# Patient Record
Sex: Female | Born: 1940 | Race: White | Hispanic: No | Smoking: Never smoker
Health system: Southern US, Community
[De-identification: ages and names within clinical notes are randomized; demographics above are authoritative.]

## PROBLEM LIST (undated history)

## (undated) DIAGNOSIS — M47812 Spondylosis without myelopathy or radiculopathy, cervical region: Secondary | ICD-10-CM

## (undated) DIAGNOSIS — M4802 Spinal stenosis, cervical region: Secondary | ICD-10-CM

## (undated) DIAGNOSIS — E785 Hyperlipidemia, unspecified: Secondary | ICD-10-CM

## (undated) DIAGNOSIS — Z5189 Encounter for other specified aftercare: Secondary | ICD-10-CM

## (undated) HISTORY — PX: COLONOSCOPY: SHX174

## (undated) HISTORY — PX: ABDOMINAL HYSTERECTOMY: SHX81

## (undated) HISTORY — PX: LAPAROSCOPIC SMALL BOWEL RESECTION: SUR793

## (undated) HISTORY — DX: Encounter for other specified aftercare: Z51.89

## (undated) HISTORY — PX: TOTAL KNEE ARTHROPLASTY: SHX125

## (undated) HISTORY — DX: Hyperlipidemia, unspecified: E78.5

## (undated) HISTORY — DX: Spondylosis without myelopathy or radiculopathy, cervical region: M47.812

## (undated) HISTORY — PX: ANTERIOR CERVICAL DECOMP/DISCECTOMY FUSION: SHX1161

## (undated) HISTORY — DX: Spinal stenosis, cervical region: M48.02

---

## 1998-10-14 ENCOUNTER — Other Ambulatory Visit: Admission: RE | Admit: 1998-10-14 | Discharge: 1998-10-14 | Payer: Self-pay | Admitting: Obstetrics & Gynecology

## 2001-02-22 ENCOUNTER — Other Ambulatory Visit: Admission: RE | Admit: 2001-02-22 | Discharge: 2001-02-22 | Payer: Self-pay | Admitting: Obstetrics & Gynecology

## 2003-03-13 ENCOUNTER — Other Ambulatory Visit: Admission: RE | Admit: 2003-03-13 | Discharge: 2003-03-13 | Payer: Self-pay | Admitting: Obstetrics & Gynecology

## 2004-09-08 ENCOUNTER — Ambulatory Visit: Payer: Self-pay | Admitting: Internal Medicine

## 2004-09-23 ENCOUNTER — Other Ambulatory Visit: Admission: RE | Admit: 2004-09-23 | Discharge: 2004-09-23 | Payer: Self-pay | Admitting: Obstetrics and Gynecology

## 2004-12-10 ENCOUNTER — Ambulatory Visit: Payer: Self-pay | Admitting: Internal Medicine

## 2005-01-01 ENCOUNTER — Ambulatory Visit: Payer: Self-pay | Admitting: Internal Medicine

## 2005-02-20 ENCOUNTER — Ambulatory Visit: Payer: Self-pay | Admitting: Internal Medicine

## 2005-02-23 ENCOUNTER — Ambulatory Visit: Payer: Self-pay | Admitting: Internal Medicine

## 2005-02-25 ENCOUNTER — Ambulatory Visit: Payer: Self-pay | Admitting: Internal Medicine

## 2007-01-25 ENCOUNTER — Ambulatory Visit: Payer: Self-pay | Admitting: Internal Medicine

## 2007-01-25 DIAGNOSIS — M353 Polymyalgia rheumatica: Secondary | ICD-10-CM | POA: Insufficient documentation

## 2007-01-25 DIAGNOSIS — R079 Chest pain, unspecified: Secondary | ICD-10-CM | POA: Insufficient documentation

## 2007-01-31 ENCOUNTER — Encounter (INDEPENDENT_AMBULATORY_CARE_PROVIDER_SITE_OTHER): Payer: Self-pay | Admitting: *Deleted

## 2007-02-23 ENCOUNTER — Ambulatory Visit: Payer: Self-pay | Admitting: Internal Medicine

## 2007-02-23 DIAGNOSIS — M542 Cervicalgia: Secondary | ICD-10-CM | POA: Insufficient documentation

## 2007-02-23 DIAGNOSIS — M5412 Radiculopathy, cervical region: Secondary | ICD-10-CM | POA: Insufficient documentation

## 2007-02-23 DIAGNOSIS — M25519 Pain in unspecified shoulder: Secondary | ICD-10-CM | POA: Insufficient documentation

## 2007-03-03 ENCOUNTER — Encounter (INDEPENDENT_AMBULATORY_CARE_PROVIDER_SITE_OTHER): Payer: Self-pay | Admitting: *Deleted

## 2007-03-05 ENCOUNTER — Encounter: Admission: RE | Admit: 2007-03-05 | Discharge: 2007-03-05 | Payer: Self-pay | Admitting: Internal Medicine

## 2007-03-07 ENCOUNTER — Encounter: Payer: Self-pay | Admitting: Internal Medicine

## 2007-03-08 ENCOUNTER — Encounter (INDEPENDENT_AMBULATORY_CARE_PROVIDER_SITE_OTHER): Payer: Self-pay | Admitting: *Deleted

## 2007-03-14 ENCOUNTER — Ambulatory Visit: Payer: Self-pay | Admitting: Internal Medicine

## 2007-03-14 DIAGNOSIS — M47812 Spondylosis without myelopathy or radiculopathy, cervical region: Secondary | ICD-10-CM | POA: Insufficient documentation

## 2007-03-14 DIAGNOSIS — G5600 Carpal tunnel syndrome, unspecified upper limb: Secondary | ICD-10-CM | POA: Insufficient documentation

## 2007-03-15 ENCOUNTER — Telehealth (INDEPENDENT_AMBULATORY_CARE_PROVIDER_SITE_OTHER): Payer: Self-pay | Admitting: *Deleted

## 2007-06-08 ENCOUNTER — Telehealth (INDEPENDENT_AMBULATORY_CARE_PROVIDER_SITE_OTHER): Payer: Self-pay | Admitting: *Deleted

## 2007-06-22 ENCOUNTER — Emergency Department (HOSPITAL_COMMUNITY): Admission: EM | Admit: 2007-06-22 | Discharge: 2007-06-22 | Payer: Self-pay | Admitting: Family Medicine

## 2007-06-22 ENCOUNTER — Telehealth (INDEPENDENT_AMBULATORY_CARE_PROVIDER_SITE_OTHER): Payer: Self-pay | Admitting: *Deleted

## 2007-07-19 ENCOUNTER — Encounter: Payer: Self-pay | Admitting: Internal Medicine

## 2007-08-30 ENCOUNTER — Encounter: Payer: Self-pay | Admitting: Internal Medicine

## 2008-02-08 ENCOUNTER — Ambulatory Visit: Payer: Self-pay | Admitting: Internal Medicine

## 2008-02-08 ENCOUNTER — Encounter (INDEPENDENT_AMBULATORY_CARE_PROVIDER_SITE_OTHER): Payer: Self-pay | Admitting: *Deleted

## 2008-02-08 DIAGNOSIS — M810 Age-related osteoporosis without current pathological fracture: Secondary | ICD-10-CM | POA: Insufficient documentation

## 2008-02-08 DIAGNOSIS — D239 Other benign neoplasm of skin, unspecified: Secondary | ICD-10-CM | POA: Insufficient documentation

## 2008-02-08 DIAGNOSIS — R9431 Abnormal electrocardiogram [ECG] [EKG]: Secondary | ICD-10-CM | POA: Insufficient documentation

## 2008-02-08 DIAGNOSIS — R5381 Other malaise: Secondary | ICD-10-CM | POA: Insufficient documentation

## 2008-02-08 DIAGNOSIS — F3289 Other specified depressive episodes: Secondary | ICD-10-CM | POA: Insufficient documentation

## 2008-02-08 DIAGNOSIS — F329 Major depressive disorder, single episode, unspecified: Secondary | ICD-10-CM | POA: Insufficient documentation

## 2008-02-08 DIAGNOSIS — M129 Arthropathy, unspecified: Secondary | ICD-10-CM | POA: Insufficient documentation

## 2008-02-08 DIAGNOSIS — R5383 Other fatigue: Secondary | ICD-10-CM | POA: Insufficient documentation

## 2008-02-08 DIAGNOSIS — E785 Hyperlipidemia, unspecified: Secondary | ICD-10-CM | POA: Insufficient documentation

## 2008-02-09 ENCOUNTER — Telehealth (INDEPENDENT_AMBULATORY_CARE_PROVIDER_SITE_OTHER): Payer: Self-pay | Admitting: *Deleted

## 2008-02-10 ENCOUNTER — Encounter (INDEPENDENT_AMBULATORY_CARE_PROVIDER_SITE_OTHER): Payer: Self-pay | Admitting: *Deleted

## 2008-02-20 ENCOUNTER — Encounter (INDEPENDENT_AMBULATORY_CARE_PROVIDER_SITE_OTHER): Payer: Self-pay | Admitting: *Deleted

## 2008-02-27 ENCOUNTER — Encounter: Payer: Self-pay | Admitting: Internal Medicine

## 2008-03-07 ENCOUNTER — Ambulatory Visit: Payer: Self-pay | Admitting: Cardiology

## 2008-03-07 ENCOUNTER — Encounter: Payer: Self-pay | Admitting: Internal Medicine

## 2008-10-10 ENCOUNTER — Ambulatory Visit: Payer: Self-pay | Admitting: Internal Medicine

## 2008-10-10 LAB — CONVERTED CEMR LAB: Vit D, 25-Hydroxy: 18 ng/mL — ABNORMAL LOW (ref 30–89)

## 2008-10-12 ENCOUNTER — Telehealth (INDEPENDENT_AMBULATORY_CARE_PROVIDER_SITE_OTHER): Payer: Self-pay | Admitting: *Deleted

## 2008-10-12 LAB — CONVERTED CEMR LAB
AST: 21 units/L (ref 0–37)
Direct LDL: 152.4 mg/dL
Total Bilirubin: 0.7 mg/dL (ref 0.3–1.2)
Total CHOL/HDL Ratio: 4
Triglycerides: 72 mg/dL (ref 0.0–149.0)

## 2008-10-18 ENCOUNTER — Ambulatory Visit: Payer: Self-pay | Admitting: Internal Medicine

## 2008-10-18 DIAGNOSIS — E559 Vitamin D deficiency, unspecified: Secondary | ICD-10-CM | POA: Insufficient documentation

## 2008-10-18 LAB — CONVERTED CEMR LAB
Cholesterol, target level: 200 mg/dL
HDL goal, serum: 50 mg/dL
LDL Goal: 100 mg/dL

## 2008-10-30 ENCOUNTER — Ambulatory Visit: Payer: Self-pay | Admitting: Internal Medicine

## 2008-10-30 DIAGNOSIS — R109 Unspecified abdominal pain: Secondary | ICD-10-CM | POA: Insufficient documentation

## 2008-10-30 DIAGNOSIS — E739 Lactose intolerance, unspecified: Secondary | ICD-10-CM | POA: Insufficient documentation

## 2008-10-30 LAB — CONVERTED CEMR LAB
Albumin: 3.7 g/dL (ref 3.5–5.2)
Alkaline Phosphatase: 41 units/L (ref 39–117)
Basophils Absolute: 0 10*3/uL (ref 0.0–0.1)
Bilirubin, Direct: 0.1 mg/dL (ref 0.0–0.3)
Eosinophils Absolute: 0.2 10*3/uL (ref 0.0–0.7)
Ketones, urine, test strip: NEGATIVE
Lipase: 26 units/L (ref 11.0–59.0)
Lymphocytes Relative: 44.4 % (ref 12.0–46.0)
MCHC: 34.1 g/dL (ref 30.0–36.0)
Neutrophils Relative %: 44.4 % (ref 43.0–77.0)
Nitrite: NEGATIVE
Platelets: 215 10*3/uL (ref 150.0–400.0)
RDW: 14.2 % (ref 11.5–14.6)
Sed Rate: 11 mm/hr (ref 0–22)
Specific Gravity, Urine: <1.005

## 2008-10-31 ENCOUNTER — Encounter (INDEPENDENT_AMBULATORY_CARE_PROVIDER_SITE_OTHER): Payer: Self-pay | Admitting: *Deleted

## 2008-11-06 ENCOUNTER — Encounter (INDEPENDENT_AMBULATORY_CARE_PROVIDER_SITE_OTHER): Payer: Self-pay | Admitting: *Deleted

## 2009-01-10 ENCOUNTER — Encounter: Admission: RE | Admit: 2009-01-10 | Discharge: 2009-01-10 | Payer: Self-pay | Admitting: Neurosurgery

## 2009-09-20 ENCOUNTER — Ambulatory Visit: Payer: Self-pay | Admitting: Internal Medicine

## 2009-09-20 DIAGNOSIS — IMO0001 Reserved for inherently not codable concepts without codable children: Secondary | ICD-10-CM | POA: Insufficient documentation

## 2009-09-20 LAB — CONVERTED CEMR LAB: Inflenza A Ag: NEGATIVE

## 2010-01-22 ENCOUNTER — Ambulatory Visit: Payer: Self-pay | Admitting: Internal Medicine

## 2010-01-23 ENCOUNTER — Encounter: Payer: Self-pay | Admitting: Internal Medicine

## 2010-01-27 ENCOUNTER — Telehealth (INDEPENDENT_AMBULATORY_CARE_PROVIDER_SITE_OTHER): Payer: Self-pay | Admitting: *Deleted

## 2010-01-28 ENCOUNTER — Encounter: Payer: Self-pay | Admitting: Cardiovascular Disease

## 2010-01-28 ENCOUNTER — Encounter (HOSPITAL_COMMUNITY): Admission: RE | Admit: 2010-01-28 | Discharge: 2010-03-31 | Payer: Self-pay | Admitting: Internal Medicine

## 2010-01-28 ENCOUNTER — Encounter (INDEPENDENT_AMBULATORY_CARE_PROVIDER_SITE_OTHER): Payer: Self-pay | Admitting: *Deleted

## 2010-01-28 ENCOUNTER — Ambulatory Visit: Payer: Self-pay | Admitting: Cardiovascular Disease

## 2010-01-28 ENCOUNTER — Ambulatory Visit: Payer: Self-pay

## 2010-03-06 ENCOUNTER — Ambulatory Visit: Payer: Self-pay | Admitting: Internal Medicine

## 2010-03-14 ENCOUNTER — Ambulatory Visit: Payer: Self-pay | Admitting: Internal Medicine

## 2010-03-14 DIAGNOSIS — M545 Low back pain, unspecified: Secondary | ICD-10-CM | POA: Insufficient documentation

## 2010-03-14 DIAGNOSIS — M25569 Pain in unspecified knee: Secondary | ICD-10-CM | POA: Insufficient documentation

## 2010-03-14 DIAGNOSIS — M25469 Effusion, unspecified knee: Secondary | ICD-10-CM | POA: Insufficient documentation

## 2010-05-22 ENCOUNTER — Encounter: Payer: Self-pay | Admitting: Internal Medicine

## 2010-07-17 ENCOUNTER — Encounter: Payer: Self-pay | Admitting: Internal Medicine

## 2010-08-03 LAB — CONVERTED CEMR LAB
ALT: 22 units/L (ref 0–35)
AST: 20 units/L (ref 0–37)
AST: 21 units/L (ref 0–37)
Albumin: 3.9 g/dL (ref 3.5–5.2)
Alkaline Phosphatase: 38 units/L — ABNORMAL LOW (ref 39–117)
BUN: 7 mg/dL (ref 6–23)
Basophils Absolute: 0 10*3/uL (ref 0.0–0.1)
Basophils Absolute: 0 10*3/uL (ref 0.0–0.1)
Basophils Relative: 0.6 % (ref 0.0–1.0)
Basophils Relative: 0.9 % (ref 0.0–3.0)
CO2: 29 meq/L (ref 19–32)
Calcium: 9.2 mg/dL (ref 8.4–10.5)
Chloride: 103 meq/L (ref 96–112)
Cholesterol: 247 mg/dL — ABNORMAL HIGH (ref 0–200)
Creatinine, Ser: 0.7 mg/dL (ref 0.4–1.2)
Creatinine, Ser: 0.7 mg/dL (ref 0.4–1.2)
Direct LDL: 169.6 mg/dL
Eosinophils Absolute: 0.2 10*3/uL (ref 0.0–0.7)
Eosinophils Absolute: 0.3 10*3/uL (ref 0.0–0.7)
Eosinophils Relative: 2.8 % (ref 0.0–5.0)
Eosinophils Relative: 5.1 % — ABNORMAL HIGH (ref 0.0–5.0)
GFR calc Af Amer: 108 mL/min
GFR calc non Af Amer: 87.2 mL/min (ref >60)
GFR calc non Af Amer: 89 mL/min
HCT: 37.2 % (ref 36.0–46.0)
HCT: 37.8 % (ref 36.0–46.0)
Hemoglobin: 13 g/dL (ref 12.0–15.0)
Lymphs Abs: 2.2 10*3/uL (ref 0.7–4.0)
MCHC: 34.6 g/dL (ref 30.0–36.0)
MCV: 91.4 fL (ref 78.0–100.0)
MCV: 92.7 fL (ref 78.0–100.0)
Monocytes Absolute: 0.3 10*3/uL (ref 0.1–1.0)
Monocytes Absolute: 0.3 10*3/uL (ref 0.1–1.0)
Monocytes Relative: 4.3 % (ref 3.0–11.0)
Neutro Abs: 3.7 10*3/uL (ref 1.4–7.7)
Neutrophils Relative %: 44.8 % (ref 43.0–77.0)
Neutrophils Relative %: 48.9 % (ref 43.0–77.0)
Platelets: 196 10*3/uL (ref 150.0–400.0)
RBC: 4.03 M/uL (ref 3.87–5.11)
RBC: 4.08 M/uL (ref 3.87–5.11)
RDW: 13.9 % (ref 11.5–14.6)
RDW: 15.6 % — ABNORMAL HIGH (ref 11.5–14.6)
Rhuematoid fact SerPl-aCnc: <20 intl units/mL (ref 0–20)
Rhuematoid fact SerPl-aCnc: <20 intl units/mL — ABNORMAL LOW (ref 0.0–20.0)
Rhuematoid fact SerPl-aCnc: <20 intl units/mL — ABNORMAL LOW (ref 0.0–20.0)
Sed Rate: 15 mm/hr (ref 0–25)
Sed Rate: 17 mm/hr (ref 0–22)
Sodium: 139 meq/L (ref 135–145)
TSH: 1.93 microintl units/mL (ref 0.35–5.50)
Total Bilirubin: 0.4 mg/dL (ref 0.3–1.2)
Total CK: 51 units/L (ref 7–177)
Total CK: 70 units/L (ref 7–177)
Triglycerides: 87 mg/dL (ref 0.0–149.0)
WBC: 5.1 10*3/uL (ref 4.5–10.5)
WBC: 6.1 10*3/uL (ref 4.5–10.5)

## 2010-08-05 NOTE — Assessment & Plan Note (Signed)
Summary: knee swollen/cbs   Vital Signs:  Patient profile:   70 year old female Weight:      145 pounds Temp:     98.1 degrees F oral Pulse rate:   72 / minute Resp:     17 per minute BP sitting:   122 / 70  (left arm) Cuff size:   large  Vitals Entered By: Shonna Chock CMA (March 14, 2010 3:23 PM) CC: 1.) Right knee swollen since Wed, no known injury   2.) Patient also with right buttock pain, Lower Extremity Joint pain, Back pain   CC:  1.) Right knee swollen since Wed, no known injury   2.) Patient also with right buttock pain, Lower Extremity Joint pain, and Back pain.  History of Present Illness: Lower Extremity Joint Pain      This is a 70 year old woman who presents with Lower Extremity Joint pain X 3 days.  The patient reports swelling, but denies redness, giving away, locking, popping, stiffness for >1 hr, decreased ROM, and weakness.  The pain is located in the right knee.  The pain began suddenly and with no injury.  The pain is described as sharp, intermittent, and activity related.  The patient denies the following symptoms: fever, rash, photosensitivity, eye symptoms, diarrhea, and dysuria.  EA:VWUJ  support; ES Tylenol. Back Pain      The patient also presents with Back pain since 09/08.  The pain is located in the right SI joint.  The pain began suddenly. No trigger or radiation. The pain is made worse by  walking.  Risk factors for serious underlying conditions include age >= 50 years.    Current Medications (verified): 1)  Budeprion Xl 300 Mg  Tb24 (Bupropion Hcl) .Marland Kitchen.. 1 By Mouth Qd 2)  Fish Oil 1000 Mg Caps (Omega-3 Fatty Acids) .Marland Kitchen.. 1 By Mouth Once Daily  Allergies: 1)  ! Pravachol 2)  Niacin 3)  Biaxin  Physical Exam  General:  in no acute distress; alert,appropriate and cooperative throughout examination Neurologic:  alert & oriented X3, strength normal in all extremities, and DTRs symmetrical and normal but tender to percussion with hammer R knee.      Knee Exam  Gait:    Slight limp noted-right.    Skin:    Intact, no scars, lesions, rashes, cafe au lait spots, or bruising.    Inspection:    swelling R knee, none on L  Palpation:    Crepitus R knee. Decreased ROM R knee   Detailed Back/Spine Exam  Skin:    Intact with no erythema; no scarring or rash.    Inspection:     normal alignment of leg, ankle, hindfoot, and foot.   Palpation:    tender  to percussion R SI  area  Vascular:    dorsalis pedis and posterior tibial pulses 2+ and symmetric, no evidence of ischemia.   Lumbosacral Exam:  Lying Straight Leg Raise:    Right:  negative   Impression & Recommendations:  Problem # 1:  KNEE PAIN, RIGHT, ACUTE (ICD-719.46)  R/O meniscal tear  Orders: Orthopedic Referral (Ortho)  Her updated medication list for this problem includes:    Celebrex 200 Mg Caps (Celecoxib) .Marland Kitchen... 1 two times a day  Problem # 2:  JOINT EFFUSION, RIGHT KNEE (ICD-719.06)  Orders: Orthopedic Referral (Ortho)  Problem # 3:  LOW BACK PAIN, ACUTE (ICD-724.2)  due to altered gait  Her updated medication list for this problem includes:  Celebrex 200 Mg Caps (Celecoxib) .Marland Kitchen... 1 two times a day  Complete Medication List: 1)  Budeprion Xl 300 Mg Tb24 (Bupropion hcl) .Marland Kitchen.. 1 by mouth qd 2)  Fish Oil 1000 Mg Caps (Omega-3 fatty acids) .Marland Kitchen.. 1 by mouth once daily 3)  Celebrex 200 Mg Caps (Celecoxib) .Marland Kitchen.. 1 two times a day  Patient Instructions: 1)  Warm comresses three times a day to R knee. 2)  Recommended remaining out of work until cleared by [employer]. Prescriptions: CELEBREX 200 MG CAPS (CELECOXIB) 1 two times a day  #12 x 0   Entered and Authorized by:   Marga Melnick MD   Signed by:   Marga Melnick MD on 03/28/2010   Method used:   Samples Given   RxID:   [id number]

## 2010-08-05 NOTE — Progress Notes (Signed)
Summary: NUCLEAR PRE PROCEDURE  Phone Note Outgoing Call Call back at [facility] Phone [phone]   Call placed by: Rea College, CMA,  January 27, 2010 2:07 PM Call placed to: Patient Summary of Call: Left message with information on Myoview Information Sheet (see scanned document for details).      Nuclear Med Background Indications for Stress Test: Evaluation for Ischemia   History: Myocardial Perfusion Study  History Comments: '04 [id number], EF=66%  Symptoms: Chest Pressure, Chest Pressure with Exertion, Dizziness, Fatigue, Light-Headedness, SOB  Symptoms Comments: Stress   Nuclear Pre-Procedure Cardiac Risk Factors: Lipids

## 2010-08-05 NOTE — Miscellaneous (Signed)
Summary: Orders Update  Clinical Lists Changes  Orders: Added new Referral order of Misc. Referral (Misc. Ref) - Signed 

## 2010-08-05 NOTE — Letter (Signed)
Summary: [facility]  [facility]   Imported By: Freddy Jaksch 09/29/2007 15:45:38  _____________________________________________________________________  External Attachment:    Type:   Image     Comment:   External Document

## 2010-08-05 NOTE — Letter (Signed)
Summary: Outpatient Coinsurance Notice  Outpatient Coinsurance Notice   Imported By: Marylou Mccoy 02/12/2010 17:56:07  _____________________________________________________________________  External Attachment:    Type:   Image     Comment:   External Document

## 2010-08-05 NOTE — Assessment & Plan Note (Signed)
Summary: pain all over body/kdc   Vital Signs:  Patient profile:   70 year old female Weight:      146 pounds Temp:     99.5 degrees F oral Pulse rate:   68 / minute Resp:     17 per minute BP sitting:   118 / 68  (left arm)  Vitals Entered By: Jeremy Johann CMA (September 20, 2009 3:16 PM) CC: fever,nausea,body aches, chills x1day Comments REVIEWED MED LIST, PATIENT AGREED DOSE AND INSTRUCTION CORRECT    CC:  fever, nausea, body aches, and chills x1day.  History of Present Illness: Chills & fever since last night with diffuse constant , sharp ( 5 out of 10)  myalgias from waist up with malaise & fatigue.Rx: Motrin helped. No localizing symptoms. no flu shot. Her son has had fevers with arthragias & elevated LFTs  Allergies: 1)  Niacin 2)  Biaxin  Review of Systems General:  Complains of chills, fatigue, fever, and malaise; denies sweats. Eyes:  Denies discharge, eye pain, red eye, and vision loss-both eyes. ENT:  Complains of earache; denies ear discharge, nasal congestion, sinus pressure, and sore throat; Frontal headache  w/o purulence or facial pain. Resp:  Denies chest pain with inspiration, cough, and sputum productive. GI:  Denies diarrhea; No clay colored stool. GU:  Denies discharge, dysuria, and hematuria; No coke colored urine . MS:  Complains of muscle aches and thoracic pain; denies joint pain, joint redness, joint swelling, and mid back pain. Derm:  Denies lesion(s) and rash.  Physical Exam  General:  in no acute distress; alert,appropriate and cooperative throughout examination Eyes:  No corneal or conjunctival inflammation noted.  Perrla. No icterus Ears:  External ear exam shows no significant lesions or deformities.  Otoscopic examination reveals clear canals, tympanic membranes are intact bilaterally without bulging, retraction, inflammation or discharge. Hearing is grossly normal bilaterally. Nose:  External nasal examination shows no deformity or  inflammation. Nasal mucosa are pink and moist without lesions or exudates. Mouth:  Oral mucosa and oropharynx without lesions or exudates.  Teeth in good repair. Neck:  No deformities, masses, or tenderness noted. Lungs:  Normal respiratory effort, chest expands symmetrically. Lungs are clear to auscultation, no crackles or wheezes. Heart:  Normal rate and regular rhythm. S1 and S2 normal without gallop, murmur, click, rub.S4 Abdomen:  Bowel sounds positive,abdomen soft and non-tender without masses, organomegaly or hernias noted. Extremities:  No clubbing, cyanosis, edema, or deformity . Neurologic:  alert & oriented X3.   Skin:  Intact without suspicious lesions or rashes Cervical Nodes:  No lymphadenopathy noted Axillary Nodes:  No palpable lymphadenopathy   Impression & Recommendations:  Problem # 1:  FEVER (ICD-780.60)  Problem # 2:  MUSCLE PAIN (ICD-729.1)  Her updated medication list for this problem includes:    Tylenol 325 Mg Tabs (Acetaminophen) .Marland Kitchen... 1 by mouth once daily  Complete Medication List: 1)  Budeprion Xl 300 Mg Tb24 (Bupropion hcl) .Marland Kitchen.. 1 by mouth qd 2)  Maximum D3 50000 Unit Caps (cholecalciferol)  .... 50,000 international units once weekly 3)  Tylenol 325 Mg Tabs (Acetaminophen) .Marland Kitchen.. 1 by mouth once daily 4)  Bone Strength  .... 1 by mouth once daily 5)  Fish Oil 1000 Mg Caps (Omega-3 fatty acids) .Marland Kitchen.. 1 by mouth once daily 6)  B-50 Complex Tabs (B complex-folic acid) .Marland Kitchen.. 1 by mouth once daily 7)  Cognitex  .Marland KitchenMarland Kitchen. 1 by mouth once daily 8)  Longevity(anti-aging Multiple) Women's Formula  .Marland Kitchen.. 1 by  mouth once daily 9)  Vitamin D3 50000 Unit Caps (cholecalciferol)  .Marland Kitchen.. 1 pill once weekly 10)  Ranitidine Hcl 150 Mg Tabs (Ranitidine hcl) .Marland Kitchen.. 1 two times a day pre meals  Patient Instructions: 1)  Drink as much fluid as you can tolerate for the next few days. 2)  Take 400-600mg  of Ibuprofen (Advil, Motrin) with food every 4-6 hours as needed for relief of pain  or comfort of fever. 3)  Recommended remaining out of work for 03/18- 20/2011. To UC if no better over 24 hrs;recommended labs: 4)  BMP ; 5)  Hepatic Panel ; 6)  CBC w/ Diff ; 7)  Urine-dip & C&S ( [facility])  Laboratory Results   Date/Time Reported: September 20, 2009 4:21 PM  Other Tests  Influenza A: negative Influenza B: negative

## 2010-08-05 NOTE — Consult Note (Signed)
Summary: [facility]  [facility]   Imported By: Lanelle Bal 06/06/2010 11:24:26  _____________________________________________________________________  External Attachment:    Type:   Image     Comment:   External Document

## 2010-08-05 NOTE — Assessment & Plan Note (Signed)
Summary: Cardiology Nuclear Testing  Nuclear Med Background Indications for Stress Test: Evaluation for Ischemia   History: Myocardial Perfusion Study  History Comments: '04 [id number], EF=66%  Symptoms: Chest Pressure, Chest Pressure with Exertion, Dizziness, DOE, Fatigue, Light-Headedness  Symptoms Comments: Stress. Last episode of CP:2 weeks ago.   Nuclear Pre-Procedure Cardiac Risk Factors: Lipids Caffeine/Decaff Intake: None NPO After: 9:00 PM Lungs: Clear. IV 0.9% NS with Angio Cath: 20g     IV Site: (L) AC IV Started by: Stanton Kidney EMT-P Chest Size (in) 36     Cup Size DD     Height (in): 60 Weight (lb): 145 BMI: 28.42  Nuclear Med Study 1 or 2 day study:  1 day     Stress Test Type:  Stress Reading MD:  Charlton Haws, MD     Referring MD:  Marga Melnick, MD Resting Radionuclide:  Technetium 79m Tetrofosmin     Resting Radionuclide Dose:  11.0 mCi  Stress Radionuclide:  Technetium 67m Tetrofosmin     Stress Radionuclide Dose:  33.0 mCi   Stress Protocol Exercise Time (min):  6:30 min     Max HR:  137 bpm     Predicted Max HR:  153 bpm  Max Systolic BP: 158 mm Hg     Percent Max HR:  89.54 %     METS: 7.7 Rate Pressure Product:  40981    Stress Test Technologist:  Rea College CMA-N     Nuclear Technologist:  Domenic Polite CNMT  Rest Procedure  Myocardial perfusion imaging was performed at rest 45 minutes following the intravenous administration of Myoview Technetium 24m Tetrofosmin.  Stress Procedure  The patient exercised for 6:30.  The patient stopped due to fatigue and denied any chest pain.  There were no diagnostic ST-T wave changes, only occasional PVC's.  Myoview was injected at peak exercise and myocardial perfusion imaging was performed after a brief delay.  QPS Raw Data Images:  Normal; no motion artifact; normal heart/lung ratio. Stress Images:  NI: Uniform and normal uptake of tracer in all myocardial segments. Rest Images:  Normal homogeneous  uptake in all areas of the myocardium. Subtraction (SDS):  Normal Transient Ischemic Dilatation:  .87  (Normal <1.22)  Lung/Heart Ratio:  .27  (Normal <0.45)  Quantitative Gated Spect Images QGS EDV:  54 ml QGS ESV:  13 ml QGS EF:  76 % QGS cine images:  normal  Findings Normal nuclear study      Overall Impression  Exercise Capacity: Fair exercise capacity. BP Response: Normal blood pressure response. Clinical Symptoms: Dyspnea ECG Impression: No significant ST segment change suggestive of ischemia. Overall Impression: Normal stress nuclear study.  Appended Document: Cardiology Nuclear Testing Excellent ; no suggestion of significant coronary artery disease. Deconditioning suggested. Gradually increase walking over 6-8 weeks to 30-45 minutes 3-4X/week. Hopp  Appended Document: Cardiology Nuclear Testing mailed.

## 2010-08-05 NOTE — Assessment & Plan Note (Signed)
Summary: BAD COLD AND COUGH X2//PH   Vital Signs:  Patient profile:   70 year old female Height:      60 inches Weight:      144 pounds O2 Sat:      98 % on Room air Temp:     99.3 degrees F oral Pulse rate:   85 / minute Resp:     16 per minute BP sitting:   140 / 86  (left arm)  Vitals Entered By: Jeremy Johann CMA (March 06, 2010 12:49 PM)  O2 Flow:  Room air CC: COUGH, SORE THROAT, RUNNY NOSE, URI symptoms   CC:  COUGH, SORE THROAT, RUNNY NOSE, and URI symptoms.  History of Present Illness: She D/Ced pravastatin due to joint pain . URI Symptoms      This is a 70 year old woman who presents with URI symptoms X 2 days.  The patient reports sore throat (improving) and dry cough, but denies nasal congestion, purulent nasal discharge, and earache.  The patient denies fever, dyspnea, and wheezing.  The patient also reports sneezing and severe fatigue.  The patient denies itchy watery eyes.  The patient denies the following risk factors for Strep sinusitis: unilateral facial pain, tooth pain, and tender adenopathy.  Rx: Tylenol Sinus, Alka Seltzer Plus, honey & tea                                                          Current Medications (verified): 1)  Budeprion Xl 300 Mg  Tb24 (Bupropion Hcl) .Marland Kitchen.. 1 By Mouth Qd 2)  Fish Oil 1000 Mg Caps (Omega-3 Fatty Acids) .Marland Kitchen.. 1 By Mouth Once Daily  Allergies (verified): 1)  ! Pravachol 2)  Niacin 3)  Biaxin  Physical Exam  General:  in no acute distress; alert,appropriate and cooperative throughout examination Ears:  External ear exam shows no significant lesions or deformities.  Otoscopic examination reveals clear canals, tympanic membranes are intact bilaterally without bulging, retraction, inflammation or discharge. Hearing is grossly normal bilaterally. Nose:  External nasal examination shows no deformity or inflammation. Nasal mucosa are pink and moist without lesions or exudates. Mouth:  Oral mucosa and oropharynx without  lesions or exudates.  Teeth in good repair. Minimal pharyngeal erythema.   Lungs:  Normal respiratory effort, chest expands symmetrically. Lungs are clear to auscultation, no crackles or wheezes. Heart:  Normal rate and regular rhythm. S1 and S2 normal without gallop, murmur, click, rub or other extra sounds. Cervical Nodes:  No lymphadenopathy noted Axillary Nodes:  No palpable lymphadenopathy   Impression & Recommendations:  Problem # 1:  URI (ICD-465.9)  Problem # 2:  COUGH (ICD-786.2)  Complete Medication List: 1)  Budeprion Xl 300 Mg Tb24 (Bupropion hcl) .Marland Kitchen.. 1 by mouth qd 2)  Fish Oil 1000 Mg Caps (Omega-3 fatty acids) .Marland Kitchen.. 1 by mouth once daily 3)  Azithromycin 250 Mg Tabs (Azithromycin) 4)  Doxycycline Hyclate 100 Mg Caps (Doxycycline hyclate) .Marland Kitchen.. 1 two times a day x 2 days then  1 once daily ; avoid sun  Patient Instructions: 1)  Mucinex DM as per package  instructions for cough. 2)  Drink as much fluid as you can tolerate for the next few days. Prescriptions: DOXYCYCLINE HYCLATE 100 MG CAPS (DOXYCYCLINE HYCLATE) 1 two times a day X 2 days  then  1 once daily ; avoid sun  #12 x 0   Entered and Authorized by:   Marga Melnick MD   Signed by:   Marga Melnick MD on 03/06/2010   Method used:   Faxed to ...       [facility]  [address] [phone]* (retail)       [address]       [place], [place]  [address]       Ph: [phone] or [phone]       Fax: [phone]   RxID:   [id number]

## 2010-08-05 NOTE — Assessment & Plan Note (Signed)
Summary: body hurt all over//fd   Vital Signs:  Patient profile:   70 year old female Weight:      137 pounds Temp:     97.9 degrees F oral Pulse rate:   84 / minute Resp:     17 per minute BP sitting:   130 / 78  (left arm) Cuff size:   regular  Vitals Entered By: Shonna Chock (October 18, 2008 11:01 AM) CC: Lipid Management Comments BODY ACHES X LONG TIME-PATIENT STATES " I TOLERATE A LOT OF PAIN UNTIL IT CONTINUES FOR A LONG TIIME" PATIENT SAID SHE THOUGHT SYMPTOMS RELATED TO CHLOSTEROL MED-OFF MED X FEW MONTHS. DISCUSS LABS    CC:  Lipid Management.  History of Present Illness: Labs reviewed : Vit D 18 on approx  1500  International Units D3 once daily. High dose vit D D/Ced. No PMH of fractures . LDL 153 off statin; "I failed to do that. I had more pain on the medication". No specific diet; no CVE  but active @ work.  NMR reviewed ; LDL goal = < 100. Risks & options discussed  Lipid Management History:      Positive NCEP/ATP III risk factors include female age 58 years old or older.  Negative NCEP/ATP III risk factors include non-diabetic, no family history for ischemic heart disease, non-tobacco-user status, non-hypertensive, no ASHD (atherosclerotic heart disease), no prior stroke/TIA, no peripheral vascular disease, and no history of aortic aneurysm.     Allergies: 1)  Niacin 2)  Biaxin  Review of Systems CV:  Denies chest pain or discomfort, leg cramps with exertion, and shortness of breath with exertion. MS:  Denies muscle aches and muscle weakness.  Physical Exam  General:  in no acute distress; alert,appropriate and cooperative throughout examination Neck:  No deformities, masses, or tenderness noted. Lungs:  Normal respiratory effort, chest expands symmetrically. Lungs are clear to auscultation, no crackles or wheezes. Heart:  Normal rate and regular rhythm. S1 and S2 normal without gallop, murmur, click, rub. S4 Pulses:  R and L carotid,radial,dorsalis pedis and  posterior tibial pulses are full and equal bilaterally Extremities:  No clubbing, cyanosis, edema Neurologic:  alert & oriented X3.   Skin:  Intact without suspicious lesions or rashes Psych:  memory intact for recent and remote, normally interactive, and good eye contact.   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 1 qhs  Complete Medication List: 1)  Budeprion Xl 300 Mg Tb24 (Bupropion hcl) .Marland Kitchen.. 1 by mouth qd 2)  Maximum D3 50000 Unit Caps (cholecalciferol)  .... 50,000 international units once weekly 3)  Tylenol 325 Mg Tabs (Acetaminophen) .Marland Kitchen.. 1 by mouth once daily 4)  Bone Strength  .... 1 by mouth once daily 5)  Fish Oil 1000 Mg Caps (Omega-3 fatty acids) .Marland Kitchen.. 1 by mouth once daily 6)  B-50 Complex Tabs (B complex-folic acid) .Marland Kitchen.. 1 by mouth once daily 7)  Cognitex  .Marland KitchenMarland Kitchen. 1 by mouth once daily 8)  Longevity(anti-aging Multiple) Women's Formula  .Marland Kitchen.. 1 by mouth once daily 9)  Vitamin D3 50000 Unit Caps (cholecalciferol)  .Marland Kitchen.. 1 pill once weekly  Lipid Assessment/Plan:      Based on NCEP/ATP III, the patient's risk factor category is "0-1 risk factors".  The patient's  lipid goals have been set as follows: Total cholesterol goal is 200; LDL cholesterol goal is 100; HDL cholesterol goal is 50; Triglyceride goal is 150.  Her LDL cholesterol goal has not been met.  Secondary causes for hyperlipidemia have been ruled out.  She has been counseled on adjunctive measures for lowering her cholesterol and has been provided with dietary instructions.    Patient Instructions: 1)  Your LDL goal = <100. See [facility] Pharmacy March 2010 program on effective Red Yeast Rice products. Recheck fasting lipids ,CPK, & hepatic panel  after 4  months of Red Yeast Rice  supplement (272.4,995.20). Calcium 600 mg two times a day & vit D 50,000 International Units ONCE WEEKLY. Prescriptions: VITAMIN D3 50000 UNIT CAPS (CHOLECALCIFEROL) 1 pill ONCE WEEKLY  #12 x 3   Entered and Authorized by:   Marga Melnick MD   Signed by:   Marga Melnick MD on 10/18/2008   Method used:   Print then Give to Patient   RxID:   [id number]

## 2010-08-05 NOTE — Assessment & Plan Note (Signed)
Summary: DISCUSS D3 MEDS, DISCUSS CHOLESTEROL///SPH   Vital Signs:  Patient profile:   70 year old female Weight:      145.2 pounds Temp:     99.0 degrees F oral Pulse rate:   80 / minute Resp:     16 per minute BP sitting:   120 / 70  (left arm) Cuff size:   large  Vitals Entered By: Shonna Chock CMA (January 22, 2010 9:11 AM) CC: 1.) Ongoing Chest pain, patient see cardiology and they recommended futher testing and patient never followed up with cardiology.  2.) Patient is fasting and would like all labs drawn (including Vit D testing), Lipid Management   CC:  1.) Ongoing Chest pain, patient see cardiology and they recommended futher testing and patient never followed up with cardiology.  2.) Patient is fasting and would like all labs drawn (including Vit D testing), and Lipid Management.  History of Present Illness:      This is a 70 year old female who presents with Chest pain intermittently for years.  The patient reports resting chest pain, exertional chest pain, shortness of breath, dizziness, and light headedness, but denies nausea, vomiting, diaphoresis, palpitations, syncope, and indigestion.  The pain is described as intermittent and pressure-like.  The pain is located in the left anterior chest.  The pain radiates to the left arm.  Episodes of chest pain last 20-30 minutes.  The pain is brought on or made worse by emotional stress.  The pain is relieved or improved with rest. Dr Ludwig Clarks 09/09 evaluation was reviewed  ; Myoview was  recommended but  she declined to complete this. Stress Cardiolite was negative in 05/2003.  Lipid Management History:      Positive NCEP/ATP III risk factors include female age 17 years old or older.  Negative NCEP/ATP III risk factors include non-diabetic, no family history for ischemic heart disease, non-tobacco-user status, non-hypertensive, no ASHD (atherosclerotic heart disease), no prior stroke/TIA, no peripheral vascular disease, and no history of  aortic aneurysm.     Current Medications (verified): 1)  Budeprion Xl 300 Mg  Tb24 (Bupropion Hcl) .Marland Kitchen.. 1 By Mouth Qd 2)  Fish Oil 1000 Mg Caps (Omega-3 Fatty Acids) .Marland Kitchen.. 1 By Mouth Once Daily  Allergies: 1)  Niacin 2)  Biaxin  Past History:  Past Medical History: Cervical  spondylosis ; CTS ;? PMH of  Polymyalgia Rheumatica ( she denies this diagnosis) Depression Hyperlipidemia: NMR Lipoprofile : LDL 169(2280/1166), HDL 52,TG 116. LDL goal = < 100. Framingham Study LDL goal = < 160 Osteoporosis Chest pain 05/2003, negative  StressCardiolite, Dr Jens Som; Myoview declined in 2009; Vitamin D deficiency  Past Surgical History: G 4 P 4, 4 C sections Hysterectomy & BSO  ? diagnosis, ? dysfunctional bleeding Small bowel ? resection ,? 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 ENT:  Denies difficulty swallowing and hoarseness. GI:  Denies abdominal pain, bloody stools, dark tarry stools, and indigestion. GU:  Denies hematuria. MS:  Complains of joint pain; denies joint redness and joint swelling; Pains in neck & shoulder. PMH of vitamin D deficiency. Psych:  Complains of depression, easily angered, easily tearful, and irritability; denies anxiety; ? minimal benefit with Budeprion.  Physical Exam  General:  well-nourished,in no acute distress; alert,appropriate and cooperative throughout examination Eyes:  No corneal or conjunctival inflammation noted. No icterus Mouth:  Oral mucosa and oropharynx without lesions or exudates.  No pharyngeal erythema.   Lungs:  Normal respiratory effort, chest expands symmetrically. Lungs are clear to auscultation, no crackles or wheezes. Heart:  Normal rate and regular rhythm. S1 and S2 normal without gallop, murmur, click, rub. S4 with slurring Abdomen:  Bowel sounds positive,abdomen soft and non-tender without masses, organomegaly or hernias  noted. Pulses:  R and L carotid,radial,dorsalis pedis and posterior tibial pulses are full and equal bilaterally Extremities:  No clubbing, cyanosis, edema, or deformity noted.Homan's negative Skin:  Intact without suspicious lesions or rashes. No jaundice Cervical Nodes:  No lymphadenopathy noted Axillary Nodes:  No palpable lymphadenopathy Psych:  memory intact for recent and remote but history vague . Negative , depressed affect. "I'm here only because you won't refill my vit D"   Impression & Recommendations:  Problem # 1:  CHEST PAIN (ICD-786.50)  Orders: EKG w/ Interpretation (93000): minor NS ST-T changes  Problem # 2:  FATIGUE (ICD-780.79)  Orders: TLB-CBC Platelet - w/Differential (85025-CBCD) TLB-TSH (Thyroid Stimulating Hormone) (84443-TSH) TLB-BMP (Basic Metabolic Panel-BMET) (80048-METABOL) Specimen Handling ([address])  Problem # 3:  HYPERLIPIDEMIA (ICD-272.4)  previously Red  Rice Yeast, off this 3 weeks  Orders: Venipuncture (60454) TLB-Lipid Panel (80061-LIPID) TLB-Hepatic/Liver Function Pnl (80076-HEPATIC)  Problem # 4:  SHOULDER PAIN, BILATERAL (ICD-719.41)   with neck pain also, R/O PMR The following medications were removed from the medication list:    Tylenol 325 Mg Tabs (Acetaminophen) .Marland Kitchen... 1 by mouth once daily  Orders: Venipuncture (09811) TLB-Sedimentation Rate (ESR) (85652-ESR)  Problem # 5:  VITAMIN D DEFICIENCY (ICD-268.9)  Orders: Venipuncture (91478) T-Vitamin D (25-Hydroxy) (29562-13086)  Complete Medication List: 1)  Budeprion Xl 300 Mg Tb24 (Bupropion hcl) .Marland Kitchen.. 1 by mouth qd 2)  Fish Oil 1000 Mg Caps (Omega-3 fatty acids) .Marland Kitchen.. 1 by mouth once daily  Lipid Assessment/Plan:      Based on NCEP/ATP III, the patient's risk factor category is "0-1 risk factors".  The patient's lipid goals are as follows: Total cholesterol goal is 200; LDL cholesterol goal is 100; HDL cholesterol goal is 50; Triglyceride goal is 150.  Her LDL cholesterol  goal has not been met.  Secondary causes for hyperlipidemia have been ruled out.  She has been counseled on adjunctive measures for lowering her cholesterol and has been provided with dietary instructions.    Patient Instructions: 1)  Complete stool cards. Further recommendations will be directed by lab results.

## 2010-08-07 NOTE — Consult Note (Signed)
Summary: [facility]   Imported By: Lanelle Bal 08/01/2010 10:55:43  _____________________________________________________________________  External Attachment:    Type:   Image     Comment:   External Document

## 2010-08-25 ENCOUNTER — Encounter (INDEPENDENT_AMBULATORY_CARE_PROVIDER_SITE_OTHER): Payer: Self-pay | Admitting: *Deleted

## 2010-09-02 NOTE — Letter (Signed)
Summary: Primary Care Appointment Letter  [facility] [place]  [address] [place], [place] [address]   Phone: [phone]  Fax: [phone]    08/25/2010 MRN: [id number]  Pamela Blair [address] [place], [place]  [address]  Dear [name],   Your Primary Care Physician Marga Melnick MD has indicated that:    _______it is time to schedule an appointment.    _______you missed your appointment on______ and need to call and          reschedule.    ___X____you need to have lab work done.    _______you need to schedule an appointment discuss lab or test results.    _______you need to call to reschedule your appointment that is                       scheduled on _________.     Please call our office as soon as possible. Our phone number is [phone]. Please press option 1. Our office is open 8a-5p, Monday through Friday.     Thank you,    [facility] Scheduler

## 2010-09-12 ENCOUNTER — Ambulatory Visit (INDEPENDENT_AMBULATORY_CARE_PROVIDER_SITE_OTHER): Payer: Medicare Other | Admitting: Internal Medicine

## 2010-09-12 ENCOUNTER — Emergency Department (HOSPITAL_COMMUNITY)
Admission: EM | Admit: 2010-09-12 | Discharge: 2010-09-12 | Disposition: A | Discharge status: Home / Self Care | Payer: Medicare Other | Attending: Emergency Medicine | Admitting: Emergency Medicine

## 2010-09-12 ENCOUNTER — Emergency Department (HOSPITAL_COMMUNITY): Payer: Medicare Other

## 2010-09-12 ENCOUNTER — Encounter: Payer: Self-pay | Admitting: Internal Medicine

## 2010-09-12 DIAGNOSIS — R7309 Other abnormal glucose: Secondary | ICD-10-CM | POA: Diagnosis present

## 2010-09-12 DIAGNOSIS — R109 Unspecified abdominal pain: Secondary | ICD-10-CM | POA: Insufficient documentation

## 2010-09-12 DIAGNOSIS — R9389 Abnormal findings on diagnostic imaging of other specified body structures: Secondary | ICD-10-CM | POA: Diagnosis present

## 2010-09-12 DIAGNOSIS — R0682 Tachypnea, not elsewhere classified: Secondary | ICD-10-CM | POA: Insufficient documentation

## 2010-09-12 DIAGNOSIS — R112 Nausea with vomiting, unspecified: Secondary | ICD-10-CM | POA: Insufficient documentation

## 2010-09-12 DIAGNOSIS — E876 Hypokalemia: Secondary | ICD-10-CM

## 2010-09-12 DIAGNOSIS — K259 Gastric ulcer, unspecified as acute or chronic, without hemorrhage or perforation: Secondary | ICD-10-CM | POA: Insufficient documentation

## 2010-09-12 LAB — COMPREHENSIVE METABOLIC PANEL WITH GFR
ALT: 23 U/L (ref 0–35)
AST: 22 U/L (ref 0–37)
Albumin: 4.4 g/dL (ref 3.5–5.2)
Alkaline Phosphatase: 53 U/L (ref 39–117)
Glucose, Bld: 116 mg/dL — ABNORMAL HIGH (ref 70–99)
Potassium: 3.4 meq/L — ABNORMAL LOW (ref 3.5–5.1)
Sodium: 137 meq/L (ref 135–145)
Total Protein: 7.7 g/dL (ref 6.0–8.3)

## 2010-09-12 LAB — CBC
HCT: 43.6 % (ref 36.0–46.0)
Hemoglobin: 14.1 g/dL (ref 12.0–15.0)
MCH: 29.9 pg (ref 26.0–34.0)
MCHC: 32.3 g/dL (ref 30.0–36.0)
MCV: 92.6 fL (ref 78.0–100.0)
Platelets: 252 K/uL (ref 150–400)
RBC: 4.71 MIL/uL (ref 3.87–5.11)
RDW: 14 % (ref 11.5–15.5)
WBC: 8.5 K/uL (ref 4.0–10.5)

## 2010-09-12 LAB — LIPASE, BLOOD: Lipase: 25 U/L (ref 11–59)

## 2010-09-12 LAB — COMPREHENSIVE METABOLIC PANEL
BUN: 8 mg/dL (ref 6–23)
CO2: 30 mEq/L (ref 19–32)
Calcium: 9.2 mg/dL (ref 8.4–10.5)
Chloride: 99 mEq/L (ref 96–112)
Creatinine, Ser: 0.62 mg/dL (ref 0.4–1.2)
GFR calc Af Amer: >60 mL/min (ref >60)
GFR calc non Af Amer: >60 mL/min (ref >60)
Total Bilirubin: 0.7 mg/dL (ref 0.3–1.2)

## 2010-09-12 LAB — URINALYSIS, ROUTINE W REFLEX MICROSCOPIC
Bilirubin Urine: NEGATIVE
Glucose, UA: NEGATIVE mg/dL
Hgb urine dipstick: NEGATIVE
Ketones, ur: NEGATIVE mg/dL
Nitrite: NEGATIVE
Protein, ur: NEGATIVE mg/dL
Specific Gravity, Urine: 1.016 (ref 1.005–1.030)
Urobilinogen, UA: 0.2 mg/dL (ref 0.0–1.0)
pH: 8.5 — ABNORMAL HIGH (ref 5.0–8.0)

## 2010-09-12 LAB — DIFFERENTIAL
Basophils Absolute: 0 10*3/uL (ref 0.0–0.1)
Basophils Relative: 0 % (ref 0–1)
Eosinophils Absolute: 0.1 10*3/uL (ref 0.0–0.7)
Eosinophils Relative: 1 % (ref 0–5)
Lymphocytes Relative: 27 % (ref 12–46)
Lymphs Abs: 2.3 K/uL (ref 0.7–4.0)
Monocytes Absolute: 0.4 10*3/uL (ref 0.1–1.0)
Monocytes Relative: 5 % (ref 3–12)
Neutro Abs: 5.7 K/uL (ref 1.7–7.7)
Neutrophils Relative %: 67 % (ref 43–77)

## 2010-09-12 MED ORDER — IOHEXOL 300 MG/ML  SOLN
100.0000 mL | Freq: Once | INTRAMUSCULAR | Status: AC | PRN
  Administered 2010-09-12: 100 mL via INTRAVENOUS

## 2010-09-15 ENCOUNTER — Encounter: Payer: Self-pay | Admitting: Gastroenterology

## 2010-09-15 LAB — H. PYLORI ANTIBODY, IGG: H Pylori IgG: 3.67 {ISR} — ABNORMAL HIGH

## 2010-09-22 ENCOUNTER — Telehealth: Payer: Self-pay

## 2010-09-23 NOTE — Letter (Signed)
Summary: New Patient letter  [facility]  [address] [place], [place] [address]   Phone: [phone]  Fax: [phone]       09/15/2010 MRN: [id number]  Pamela Blair [address] [place], [place]  [address]  [place]  Dear [name],  Welcome to the Gastroenterology Division at [facility].    You are scheduled to see Dr.  Arlyce Dice on 10-22-10 at 10:45am on the 3rd floor at [facility], [address]  We ask that you try to arrive at our office 15 minutes prior to your appointment time to allow for check-in.  We would like you to complete the enclosed self-administered evaluation form prior to your visit and bring it with you on the day of your appointment.  We will review it with you.  Also, please bring a complete list of all your medications or, if you prefer, bring the medication bottles and we will list them.  Please bring your insurance card so that we may make a copy of it.  If your insurance requires a referral to see a specialist, please bring your referral form from your primary care physician.  Co-payments are due at the time of your visit and may be paid by cash, check or credit card.     Your office visit will consist of a consult with your physician (includes a physical exam), any laboratory testing he/she may order, scheduling of any necessary diagnostic testing (e.g. x-ray, ultrasound, CT-scan), and scheduling of a procedure (e.g. Endoscopy, Colonoscopy) if required.  Please allow enough time on your schedule to allow for any/all of these possibilities.    If you cannot keep your appointment, please call [phone] to cancel or reschedule prior to your appointment date.  This allows [place] the opportunity to schedule an appointment for another patient in need of care.  If you do not cancel or reschedule by 5 p.m. the business day prior to your appointment date, you will be charged a $50.00 late cancellation/no-show fee.    Thank you for choosing  [facility] for your medical needs.  We appreciate the opportunity to care for you.  Please visit [place] at our website  to learn more about our practice.                     Sincerely,                                                             The Gastroenterology Division

## 2010-09-23 NOTE — Assessment & Plan Note (Signed)
Summary: VOMITING ALL NIGHT, PAIN IN STOMACH///SPH   Vital Signs:  Patient profile:   70 year old female Weight:      147.13 pounds Temp:     97.6 degrees F oral Pulse rate:   78 / minute Pulse rhythm:   regular BP sitting:   134 / 88  (left arm) Cuff size:   large  Vitals Entered By: Army Fossa CMA (September 12, 2010 3:57 PM) CC: Pt here c/o vomitting, no diarrhea started this am Comments [facility]   History of Present Illness:  acute visit  develop bilateral lower back pain  late yesterday then , last night she developed nausea and vomiting, symptoms are getting progressively worse, she has vomited several times today, clear fluids only. No food or hematemesis. Along with that, she has moderate to intense mid abdominal pain and upper abdominal pain.   Current Medications (verified): 1)  Budeprion Xl 300 Mg  Tb24 (Bupropion Hcl) .Marland Kitchen.. 1 By Mouth Qd 2)  Pravastatin Sodium 20 Mg Tabs (Pravastatin Sodium) .... **appointment Due** 1 By Mouth At Bedtime **labs Due**  Allergies (verified): 1)  ! Pravachol 2)  Niacin 3)  Biaxin  Past History:  Past Medical History: Reviewed history from 01/22/2010 and no changes required. Cervical  spondylosis ; CTS ;? PMH of  Polymyalgia Rheumatica ( she denies this diagnosis) Depression Hyperlipidemia: NMR Lipoprofile : LDL 169(2280/1166), HDL 52,TG 116. LDL goal = < 100. Framingham Study LDL goal = < 160 Osteoporosis Chest pain 05/2003, negative  StressCardiolite, Dr Jens Som; Myoview declined in 2009; Vitamin D deficiency  Past Surgical History: G 4 P 4, 4 C sections Hysterectomy & BSO  ? diagnosis, ? dysfunctional bleeding Small bowel ? resection ,? 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 Colonoscopy negative  Social History: married tobacco--no ETOH--no  Review of Systems General:  Denies chills and fever; no appetite . Resp:  Denies cough and shortness of breath. GI:  Denies diarrhea; normal BM  today no  recent heartburn. GU:  Denies dysuria and hematuria.  Physical Exam  General:  alert and well-developed.   mild distress due to abdominal pain nausea Lungs:  normal respiratory effort, no intercostal retractions, no accessory muscle use, and normal breath sounds.   Heart:  normal rate, regular rhythm, and no murmur.   Abdomen:   abdomen is nondistended, soft, tender mid abdomen > upper abdomen.  No lower abdominal tenderness.  bowel sounds are present. No mass or rebound. Well healed postsurgical scars  Extremities:   no edema   Impression & Recommendations:  Problem # 1:  ABDOMINAL PAIN OTHER SPECIFIED SITE (ICD-44.44)   70 year old lady with upper abdominal discomfort  , persistent nausea and vomiting. No hematemesis  past surgical history is positive for a previous SBO (according to the patient  she had surgery)  Differential diagnoses of her symptoms include PUD, early SBO, cholelithiasis, others.  I think she needs further workup today , she agreed to go to [facility].  we contacted the charge nurse , she knows  about the case, we'll also send all our notes.  Orders: Gastroenterology Referral (GI)  Problem # 2:  addendum ER records reviewed, CT :   Gastric wall thickening without surrounding mesenteric stranding or   abscess formation or free air.  Differential considerations include   gastritis, ulcer with adjacent edema, or less likely   neoplastic/malignant primary or metastatic infiltration. was d/c on PPI, needs f/u w/ GI and PCP, will make  a GI referal and let PCP (Hopp ) know Jose E. Paz MD  September 14, 2010 11:33 AM   Complete Medication List: 1)  Budeprion Xl 300 Mg Tb24 (Bupropion hcl) .Marland Kitchen.. 1 by mouth qd 2)  Pravastatin Sodium 20 Mg Tabs (Pravastatin sodium) .... **appointment due** 1 by mouth at bedtime **labs due**   Orders Added: 1)  Gastroenterology Referral [GI] 2)  Est. Patient Level IV [10272]

## 2010-09-29 ENCOUNTER — Other Ambulatory Visit: Payer: Self-pay | Admitting: Internal Medicine

## 2010-09-29 MED ORDER — LANSOPRAZOLE 30 MG PO CPDR: DELAYED_RELEASE_CAPSULE | ORAL | Status: DC

## 2010-09-29 NOTE — Telephone Encounter (Signed)
I checked old system and med not on list (not on removed med list either). Please advise on patient's request for med

## 2010-09-29 NOTE — Telephone Encounter (Signed)
OK X1, R X 2 

## 2010-09-30 ENCOUNTER — Other Ambulatory Visit: Payer: Self-pay | Admitting: Internal Medicine

## 2010-09-30 NOTE — Telephone Encounter (Signed)
DUPLICATE

## 2010-10-02 NOTE — Progress Notes (Signed)
Summary: FYI: GI appointment cancelled   Phone Note Call from Patient   Caller: [facility], Dr.Ganem Details for Reason: FYI Summary of Call: Patient cancelled GI appointment because they called her to remind her of appointment and told her she was going to have a consultaion first and then whatever test that needs to be done will be completed 1-2 weeks following. Patient then stated she will just wait until 10/2010 ( this is when Dr.Hopper was going to do additional testing or have her see a [facility])    Shonna Chock CMA  September 22, 2010 4:13 PM   Follow-up for Phone Call         she should call if symptoms persist or progress Follow-up by: Marga Melnick MD,  September 22, 2010 4:56 PM  Additional Follow-up for Phone Call Additional follow up Details #1::        Patient will call if any assistance needed./[facility] CMA  September 23, 2010 9:43 AM

## 2010-10-14 ENCOUNTER — Encounter: Payer: Self-pay | Admitting: Internal Medicine

## 2010-10-15 ENCOUNTER — Encounter: Payer: Self-pay | Admitting: Internal Medicine

## 2010-10-15 ENCOUNTER — Ambulatory Visit (INDEPENDENT_AMBULATORY_CARE_PROVIDER_SITE_OTHER): Payer: Medicare Other | Admitting: Internal Medicine

## 2010-10-15 DIAGNOSIS — M549 Dorsalgia, unspecified: Secondary | ICD-10-CM

## 2010-10-15 DIAGNOSIS — E785 Hyperlipidemia, unspecified: Secondary | ICD-10-CM

## 2010-10-15 DIAGNOSIS — M545 Low back pain, unspecified: Secondary | ICD-10-CM

## 2010-10-15 DIAGNOSIS — R112 Nausea with vomiting, unspecified: Secondary | ICD-10-CM

## 2010-10-15 LAB — POCT URINALYSIS DIPSTICK
Nitrite, UA: NEGATIVE
Protein, UA: NEGATIVE
Spec Grav, UA: 1.01
Urobilinogen, UA: NEGATIVE
pH, UA: 6

## 2010-10-15 MED ORDER — TRAMADOL HCL 50 MG PO TABS
50.0000 mg | ORAL_TABLET | Freq: Four times a day (QID) | ORAL | Status: DC | PRN
Start: 2010-10-15 — End: 2010-12-30

## 2010-10-15 NOTE — Patient Instructions (Signed)
Schedule fasting Boston Heart Panel (1304X) to optimally assess cholesterol risks & options.

## 2010-10-15 NOTE — Progress Notes (Signed)
  Subjective:    Patient ID: Pamela Blair, female    DOB: April 21, 1941, 70 y.o.   MRN: [id number]  HPI  BACK PAIN  Location: LS area Quality: sharp   Onset: 04/08 Worse with: no trigger or injury Better with: no sustained  relieve; minimal benefit with Tylenol & heating pad Radiation: no Trauma: no Best sitting/standing/leaning forward: worse  with all of these maneuvers  Red Flags Fecal/urinary incontinence: no  Numbness/Weakness: no  Fever/chills/sweats: no  Night pain: yes  Unexplained weight loss: no  No relief with bedrest: no  h/o cancer/immunosuppression: no  (oral steroids remotely)  PMH of osteoporosis; yes ; chronic steroid use: no .  She states she's had no imaging of the lumbosacral spine  She did not call to have her statin refill; she states that " I  was never too happy to take that". She has been resistant to taking statins repeatedly. I discussed performing an advanced cholesterol test to optimally assess the long-term risk so that she can make an informed decision as to whether to take the statins or not.  She is also very concerned that her GI evaluation is delayed. I reviewed the chart and  The  appointment with Dr. Arlyce Dice  Is on April 18 one week from today.  With the last episode of back pain she had taken nonsteroidals. Subsequently she been seen in the emergency room with nausea and vomiting. She equates the low back pain as the presenting symptom of her prior nausea and vomiting episode.   I explained the nonsteroidals could induce gastritis or even ulcers.           Review of Systems     Objective:   Physical Exam    She is in no acute distress. The skin is warm and dry. There is no jaundice. No scleral icterus is present.  Abdomen is soft; she describes tenderness of the suprapubic  area.  The urine does show microscopic  hematuria; culture and sensitivity have been ordered.  Her deep tendon reflexes are normal. Strength is good  bilaterally.  She's able to lie down and sit up without significant help.   There's tenderness to palpation of the lumbosacral spine.  She able she is able to walk on her tiptoes and heels. Gait is normal.  She's extremely anxious and has difficulty focusing on one topic. As she discussed her back pain she started complaining of neck pain. She then became upset about her upcoming GI evaluation.          Assessment & Plan:  #1 low back syndrome  #2 history of osteoporosis with remote oral steroid use  #3 nausea and vomiting; rule out gastritis or ulcer  #4 significant dyslipidemia with increased risk; she has statin phobia  Plan: #1 tramadol will be prescribed for the pain. Is not listed as an allergy or intolerance.  #2Extensive lumbosacral spine films will be performed.  #3 and advanced cholesterol panel will be performed to optimally assess her long-term risk. Those risks will be discussed with her and she can make an informed decision as to whether or not she will take a statin or other medication .

## 2010-10-17 LAB — CULTURE, URINE COMPREHENSIVE
Colony Count: NO GROWTH
Organism ID, Bacteria: NO GROWTH

## 2010-10-22 ENCOUNTER — Ambulatory Visit: Payer: Medicare Other | Admitting: Gastroenterology

## 2010-11-12 ENCOUNTER — Emergency Department (HOSPITAL_COMMUNITY): Payer: Medicare Other

## 2010-11-12 ENCOUNTER — Emergency Department (HOSPITAL_COMMUNITY)
Admission: EM | Admit: 2010-11-12 | Discharge: 2010-11-12 | Disposition: A | Discharge status: Home / Self Care | Payer: Medicare Other | Attending: Emergency Medicine | Admitting: Emergency Medicine

## 2010-11-12 DIAGNOSIS — M25559 Pain in unspecified hip: Secondary | ICD-10-CM | POA: Insufficient documentation

## 2010-11-12 DIAGNOSIS — M545 Low back pain, unspecified: Secondary | ICD-10-CM | POA: Insufficient documentation

## 2010-11-12 DIAGNOSIS — E785 Hyperlipidemia, unspecified: Secondary | ICD-10-CM | POA: Insufficient documentation

## 2010-11-18 ENCOUNTER — Other Ambulatory Visit: Payer: Self-pay | Admitting: Neurosurgery

## 2010-11-18 DIAGNOSIS — M545 Low back pain, unspecified: Secondary | ICD-10-CM

## 2010-11-18 NOTE — Assessment & Plan Note (Signed)
[facility]                            CARDIOLOGY OFFICE NOTE   NAME:Pamela Blair, Pamela Blair                     MRN:          [id number]  DATE:03/07/2008                            DOB:          14-Oct-1940    Pamela Blair is a 70 year old female who presents for evaluation of  dyspnea, an abnormal electrocardiogram, and vague chest pain.  The  patient was seen in this office in October 2004, secondary to atypical  chest pain.  A Myoview at that time showed an ejection fraction of 66%.  There is mildly apical thinning, but no ischemia.  The patient typically  does not have significant dyspnea on exertion, orthopnea, PND, pedal  edema, palpitations, presyncope, syncope, or exertional chest pain.  Several months ago, she had a feeling of dyspnea.  This was not clearly  exertional.  It could occur at any time.  She also had a feeling like  something that was wrong in my chest.  She does not describe this as a  frank pain, but an unusual feeling.  It did not radiate.  It is not  pleuritic or positional nor is it related to food.  There is no  associated nausea, vomiting, or diaphoresis.  She saw Dr. Alwyn Ren on  February 08, 2008.  She related this to him and an electrocardiogram was  also performed.  There were ST changes and we were asked to further  evaluate.  Note, she has felt well recently.   MEDICATIONS:  1. Bupropion 300 mg p.o. daily.  2. Pravachol 40 mg p.o. daily.  3. She takes Tylenol as needed.   ALLERGIES:  She has an allergy to NIACIN and BIAXIN as well as NEOMYCIN.   SOCIAL HISTORY:  She does not smoke nor does she consume alcohol.   FAMILY HISTORY:  Negative for coronary disease.   PAST MEDICAL HISTORY:  Significant for hyperlipidemia, but there is no  diabetes mellitus or hypertension.  She has no other medical problems.  She does have history of osteoporosis, depression, and cervical  spondylosis.  She has had prior C-section x4.  She has also  had a  question of small bowel resection.   REVIEW OF SYSTEMS:  She denies any headaches, fevers, or chills.  There  is no productive cough or hemoptysis.  There is no dysphagia,  odynophagia, melena, or hematochezia.  There is no dysuria or hematuria.  There is no rash or seizure activity.  There is no orthopnea, PND, or  pedal edema.  Remaining systems are negative.   PHYSICAL EXAMINATION:  VITAL SIGNS:  Today shows a blood pressure of  135/80 and pulse is 79.  She weighs 136 pounds.  GENERAL:  She is well-developed and well-nourished, in no acute  distress.  She does not appear to be depressed.  There is no peripheral  clubbing.  SKIN:  Warm and dry.  BACK:  Normal.  HEENT:  Normal with normal eyelids.  NECK:  Supple with normal upstroke bilaterally.  No bruits noted.  There  is no jugular venous distention.  I cannot appreciate thyromegaly.  CHEST:  Clear to auscultation.  No expansion.  CARDIOVASCULAR:  Regular rhythm.  Normal S1 and S2.  No murmurs, rubs,  or gallops noted.  ABDOMINAL:  Nontender.  Positive bowel sounds.  No hepatosplenomegaly.  No mass appreciated.  There is no abdominal bruit.  EXTREMITIES:  She has 2+ femoral pulses bilaterally and no bruits.  Extremities show no edema and palpate no cords.  She has 2+ dorsalis  pedis pulse bilaterally.  NEUROLOGIC:  Grossly intact.   I have reviewed the electrocardiogram dated February 08, 2008, from Dr.  Frederik Pear office.  It shows a sinus rhythm at a rate of 75.  There are  nonspecific anterior T-wave changes.  Note when compared to the  electrocardiogram of April 25, 2003, the anterior T-wave changes are  slightly more prominent.   DIAGNOSES:  1. Dyspnea - etiology of this is unclear to me.  I do not find her to      be volume overloaded and this does not appear to be significant      problem at present.  She is concerned about these along with a      vague sensation of pain in her chest at times.  We scheduled her  to      have a stress Myoview.  If it shows no ischemia, then we will      continue with medical therapy.  2. Abnormal echocardiogram - I have reviewed her electrocardiogram and      these changes appeared to be nonspecific.  If her Myoview is      normal, we will not pursue this further.  3. Hyperlipidemia - she will continue on Pravachol and Dr. Alwyn Ren is      following her lipids and liver.  4. Chest pain - there is a vague sensation of something unusual in her      chest.  This is not clearly chest pain.  We will await the results      of her Myoview.   If Myoview is normal, I will see her back on an as needed basis.     Madolyn Frieze Jens Som, MD, [facility]  Electronically Signed    BSC/MedQ  DD: 03/07/2008  DT: 03/08/2008  Job #: [id number]   cc:   Titus Dubin. Alwyn Ren, MD,FACP,FCCP

## 2010-11-28 ENCOUNTER — Ambulatory Visit
Admission: RE | Admit: 2010-11-28 | Discharge: 2010-11-28 | Disposition: A | Discharge status: Home / Self Care | Payer: Medicare Other | Source: Ambulatory Visit | Attending: Neurosurgery | Admitting: Neurosurgery

## 2010-11-28 DIAGNOSIS — M545 Low back pain, unspecified: Secondary | ICD-10-CM

## 2010-12-30 ENCOUNTER — Ambulatory Visit (INDEPENDENT_AMBULATORY_CARE_PROVIDER_SITE_OTHER): Payer: Self-pay | Admitting: Internal Medicine

## 2010-12-30 ENCOUNTER — Encounter: Payer: Self-pay | Admitting: Internal Medicine

## 2010-12-30 ENCOUNTER — Other Ambulatory Visit (INDEPENDENT_AMBULATORY_CARE_PROVIDER_SITE_OTHER): Payer: Self-pay

## 2010-12-30 DIAGNOSIS — E785 Hyperlipidemia, unspecified: Secondary | ICD-10-CM

## 2010-12-30 DIAGNOSIS — M542 Cervicalgia: Secondary | ICD-10-CM

## 2010-12-30 DIAGNOSIS — M810 Age-related osteoporosis without current pathological fracture: Secondary | ICD-10-CM

## 2010-12-30 DIAGNOSIS — M353 Polymyalgia rheumatica: Secondary | ICD-10-CM

## 2010-12-30 MED ORDER — CYCLOBENZAPRINE HCL 5 MG PO TABS: 5.0000 mg | ORAL_TABLET | Freq: Three times a day (TID) | ORAL | Status: DC | PRN

## 2010-12-30 MED ORDER — HYDROCODONE-ACETAMINOPHEN 7.5-500 MG PO TABS: 1.0000 | ORAL_TABLET | ORAL | Status: AC | PRN

## 2010-12-30 NOTE — Progress Notes (Signed)
  Subjective:    Patient ID: Pamela Blair, female    DOB: 23-Dec-1940, 70 y.o.   MRN: [id number]  HPI NECK PAIN Location: cervical spine   Onset: 6/25 in upper back  Severity: up to 10+ Pain is described as: sharp  Worse with: lateral radiation    Better with: pain meds  (tramadol)  from Dr Newell Coral, [place]  w/o benefit  Pain radiates to: both shoulders   Impaired range of motion: yes  History of repetitive motion:  no  History of overuse or hyperextension:  no  History of trauma:  no   Past history of similar problem:  no   Symptoms Back Pain:  no  Numbness/tingling:  no  Weakness:  no  Red Flags Fever:  no  Headache:  no  Bowel/bladder dysfunction:  No PMH of Cervical Radiculopathy & PolymyalgiaRheumatica      Review of Systems     Objective:   Physical Exam Gen.:In NAD but uncomfortable ;well-nourished in appearance. Alert, appropriate and cooperative throughout exam. Head: Normocephalic without obvious abnormalities; tender R posterior neck below mastoid Eyes: No corneal or conjunctival inflammation noted.  Extraocular motion intact. . Ears: External  ear exam reveals no significant lesions or deformities. Canals clear .TMs normal. Hearing is grossly normal bilaterally. Nose: External nasal exam reveals no deformity or inflammation. Nasal mucosa are pink and moist. No lesions or exudates noted. Mouth: Oral mucosa and oropharynx reveal no lesions or exudates. Teeth in good repair.No tongue deviation Neck: No deformities, masses, or tenderness noted. Range of motion  Markedly decreased laterally. Head held flexed to L Lungs: Normal respiratory effort; chest expands symmetrically. Lungs are clear to auscultation without rales, wheezes, or increased work of breathing. Heart: Normal rate and rhythm. Normal S1 and S2. No gallop, click, or rub. No murmur. Increased lordosis  noted of  the thoracic  spine. No clubbing, cyanosis, edema, or deformity noted.Tone & strength   normal.Joints normal. Nail health  good. Vascular: Carotid, radial artery  pulses are full and equal. No bruits present. Neurologic: Alert and oriented x3. Deep tendon reflexes symmetrical and normal.          Skin: Intact without suspicious lesions or rashes. Lymph: No cervical, axillary  lymphadenopathy present. Psych: Mood and affect are normal. Normally interactive                                                                                         Assessment & Plan:  #1 neck pain in context of#@ & #3 #2 cervical radiculopathy #3 Polymyalgia Rheumatica Plan : see Orders

## 2010-12-30 NOTE — Progress Notes (Signed)
Labs only

## 2010-12-30 NOTE — Patient Instructions (Signed)
Meds as directed

## 2010-12-31 LAB — SEDIMENTATION RATE: Sed Rate: 43 mm/hr — ABNORMAL HIGH (ref 0–22)

## 2011-01-02 ENCOUNTER — Telehealth: Payer: Self-pay | Admitting: *Deleted

## 2011-01-02 MED ORDER — PRAVASTATIN SODIUM 20 MG PO TABS: 20.0000 mg | ORAL_TABLET | Freq: Every day | ORAL | Status: DC

## 2011-01-02 MED ORDER — PREDNISONE 20 MG PO TABS: 20.0000 mg | ORAL_TABLET | Freq: Every day | ORAL | Status: AC

## 2011-01-02 NOTE — Telephone Encounter (Signed)
Message copied by Verdene Rio on Fri Jan 02, 2011  2:00 PM ------      Message from: Pecola Lawless      Created: Fri Jan 02, 2011 12:46 PM       Rx for Prednisone 20 mg qd #20

## 2011-01-02 NOTE — Telephone Encounter (Signed)
Pt aware Rx sent to pharmacy 

## 2011-01-21 ENCOUNTER — Encounter: Payer: Self-pay | Admitting: Internal Medicine

## 2011-01-29 ENCOUNTER — Other Ambulatory Visit: Payer: Self-pay | Admitting: Internal Medicine

## 2011-01-29 DIAGNOSIS — M542 Cervicalgia: Secondary | ICD-10-CM

## 2011-01-30 ENCOUNTER — Other Ambulatory Visit: Payer: Self-pay | Admitting: Internal Medicine

## 2011-01-30 DIAGNOSIS — M353 Polymyalgia rheumatica: Secondary | ICD-10-CM

## 2011-01-30 MED ORDER — CYCLOBENZAPRINE HCL 5 MG PO TABS: 5.0000 mg | ORAL_TABLET | Freq: Three times a day (TID) | ORAL | Status: DC | PRN

## 2011-01-30 NOTE — Telephone Encounter (Signed)
Dr.Hopper please advise on refill request for Hydrocodone. Tramadol was requested yesterday and filled. ? If patient should be taking 2 pain medications

## 2011-01-30 NOTE — Telephone Encounter (Signed)
The tramadol would be a safer medication to take. If the pain is persisting ; we do need a subspecialty assessment.

## 2011-01-30 NOTE — Telephone Encounter (Signed)
Spoke with patient, patient states she is still in pain (left side) and it is hindering her life. Patient would like a referral

## 2011-02-05 ENCOUNTER — Encounter: Payer: Self-pay | Admitting: Internal Medicine

## 2011-02-05 ENCOUNTER — Ambulatory Visit (INDEPENDENT_AMBULATORY_CARE_PROVIDER_SITE_OTHER): Payer: Medicare Other | Admitting: Internal Medicine

## 2011-02-05 ENCOUNTER — Encounter: Payer: Self-pay | Admitting: *Deleted

## 2011-02-05 VITALS — BP 120/78 | HR 83 | Temp 98.7°F | Wt 145.4 lb

## 2011-02-05 DIAGNOSIS — E785 Hyperlipidemia, unspecified: Secondary | ICD-10-CM

## 2011-02-05 DIAGNOSIS — E876 Hypokalemia: Secondary | ICD-10-CM

## 2011-02-05 DIAGNOSIS — R7309 Other abnormal glucose: Secondary | ICD-10-CM

## 2011-02-05 DIAGNOSIS — M542 Cervicalgia: Secondary | ICD-10-CM

## 2011-02-05 MED ORDER — PREDNISONE 20 MG PO TABS: 20.0000 mg | ORAL_TABLET | Freq: Two times a day (BID) | ORAL | Status: AC

## 2011-02-05 NOTE — Progress Notes (Signed)
Subjective:    Patient ID: Pamela Blair, female    DOB: 1940/12/07, 70 y.o.   MRN: [id number]  HPI NECK PAIN: Location: @ base of neck bilaterally   Onset: last week only on L   Severity:  "over 10" Pain is described as: sharp & throbbing  Worse with: no triggers    Better with: no  Pain radiates to: to L elbow   Impaired range of motion: yes History of repetitive motion:  no  History of overuse or hyperextension:  no  History of trauma:  no   Past history of similar problem:  yes, 2 months ago Symptoms Back Pain:  yes, chronic LBS  Numbness/tingling:  no  Weakness:  no Red Flags Fever:  no  Headache:  no  Bowel/bladder dysfunction:  No  Appt with Rheumatologist 8/16  Her sedimentation rate in April 2010 was 11; 17  In  July 2011. On June 26 the sedimentation rate was 43. Rheumatoid factor has been less than 20.  #2 she also brought in her [facility] heart panel for review; unfortunately we  will not have time to review that in detail as  I would like. Her last LDL in July 2011 was 170. Her HDL was 58. Triglycerides were 87 indicating that all risk is genetic not dietary. The advanced cholesterol panel does suggest that as that  Zetia  would be of benefit in decreasing absorption of cholesterol.  Her Lp(a) was 120; normal is less than 50. By history she's been intolerant to niacin. Additionally her C-reactive protein is high which is a nonspecific finding. The LpPLA2  is also severely elevated at 304 (minimal goal less than 235). Genetic testing indicates ability to metabolize drugs. By history she has an allergy to pravastatin.  In March of this year her glucose was 116. Her A1c on the advanced cholesterol testing was 5.2% indicating absence of diabetes. Her potassium was 3.4 in March as well.        Review of Systems     Objective:   Physical Exam Gen.:  well-nourished in appearance. Uncomfortable , but cooperative throughout exam. Head: Normocephalic without obvious  abnormalities Eyes: No corneal or conjunctival inflammation noted.  Mouth: Oral mucosa and oropharynx reveal no lesions or exudates. Teeth in good repair. Neck: No deformities, masses, or tenderness noted. Range of motion  Decreased due to pain. Thyroid  normal. Lungs: Normal respiratory effort; chest expands symmetrically. Lungs are clear to auscultation without rales, wheezes, or increased work of breathing. Heart: Normal rate and rhythm. Normal S1 and S2. No gallop, click, or rub. S4 w/o  murmur.                      Musculoskeletal/extremities: No deformity or scoliosis noted of  the thoracic or lumbar spine. No clubbing, cyanosis, edema, or deformity noted. Range of motion  normal .Tone & strength  normal.Joints normal. Nail health good except chronic changes R index nail. Vascular: Carotid, radial artery, dorsalis pedis and  posterior tibial pulses are full and equal. No bruits present. Neurologic: Alert and oriented x3. Deep tendon reflexes symmetrical and normal.          Skin: Intact without suspicious lesions or rashes. Lymph: No cervical, axillary  lymphadenopathy present. Psych: Mood and affect are  flat  Assessment & Plan:  #1 neck and left upper extremity pain. Clinical picture and rising sedimentation rate suggests possible polymyalgia rheumatica. Steroids are problematic in view of the previous potassium of 3.4 and mild hyperglycemia.  #2 dyslipidemia with major risk present. These include the elevated Lp(a) , LpPLA2 as well as the total LDL. Problematic is her intolerance to pravastatin and  niacin. Hypertension is suggested; Zetia is one option. Additionally WelChol could be considered.  Plan: See orders and recommendations.

## 2011-02-05 NOTE — Patient Instructions (Signed)
Review this record and see if you're willing to consider the agent  Zetia to  block cholesterol absorption. Please review Dr Gildardo Griffes book Eat, Drink & Be Healthy for dietary cholesterol information.

## 2011-02-10 ENCOUNTER — Other Ambulatory Visit: Payer: Self-pay | Admitting: Neurosurgery

## 2011-02-10 ENCOUNTER — Ambulatory Visit
Admission: RE | Admit: 2011-02-10 | Discharge: 2011-02-10 | Disposition: A | Discharge status: Home / Self Care | Payer: Medicare Other | Source: Ambulatory Visit | Attending: Internal Medicine | Admitting: Internal Medicine

## 2011-02-10 ENCOUNTER — Ambulatory Visit
Admission: RE | Admit: 2011-02-10 | Discharge: 2011-02-10 | Disposition: A | Discharge status: Home / Self Care | Payer: Medicare Other | Source: Ambulatory Visit | Attending: Neurosurgery | Admitting: Neurosurgery

## 2011-02-10 DIAGNOSIS — M549 Dorsalgia, unspecified: Secondary | ICD-10-CM

## 2011-02-10 DIAGNOSIS — M542 Cervicalgia: Secondary | ICD-10-CM

## 2011-02-10 DIAGNOSIS — M5030 Other cervical disc degeneration, unspecified cervical region: Secondary | ICD-10-CM

## 2011-02-10 DIAGNOSIS — M47812 Spondylosis without myelopathy or radiculopathy, cervical region: Secondary | ICD-10-CM

## 2011-02-10 DIAGNOSIS — M5412 Radiculopathy, cervical region: Secondary | ICD-10-CM

## 2011-02-13 ENCOUNTER — Telehealth: Payer: Self-pay | Admitting: Internal Medicine

## 2011-02-16 ENCOUNTER — Telehealth: Payer: Self-pay | Admitting: Gastroenterology

## 2011-02-16 NOTE — Telephone Encounter (Signed)
Left message for pt to call back  °

## 2011-02-16 NOTE — Telephone Encounter (Signed)
Spoke with pt and she requested a sooner appt. Pt scheduled to see Dr. Arlyce Dice 03/13/11@2 :15pm. Pt aware of new appt date and time.

## 2011-02-16 NOTE — Telephone Encounter (Signed)
Pt called would like to go ahead with GI referral for stomach pain.

## 2011-02-24 ENCOUNTER — Other Ambulatory Visit: Payer: Self-pay | Admitting: Internal Medicine

## 2011-02-24 MED ORDER — PRAVASTATIN SODIUM 20 MG PO TABS: 20.0000 mg | ORAL_TABLET | Freq: Every day | ORAL | Status: DC

## 2011-02-24 NOTE — Telephone Encounter (Signed)
RX sent to pharmacy  

## 2011-03-13 ENCOUNTER — Encounter: Payer: Self-pay | Admitting: Gastroenterology

## 2011-03-13 ENCOUNTER — Ambulatory Visit (INDEPENDENT_AMBULATORY_CARE_PROVIDER_SITE_OTHER): Payer: Medicare Other | Admitting: Gastroenterology

## 2011-03-13 VITALS — BP 130/72 | HR 84 | Ht 58.25 in | Wt 149.0 lb

## 2011-03-13 DIAGNOSIS — R9389 Abnormal findings on diagnostic imaging of other specified body structures: Secondary | ICD-10-CM

## 2011-03-13 NOTE — Progress Notes (Signed)
History of Present Illness:  Pamela Blair is a 70 year old white female referred at the request of Dr. Alwyn Ren for evaluation of an abnormal CT scan.   In March, 2012 she was seen in the ER because of protracted nausea, vomiting and lower back pain. A CT scan, which I reviewed, demonstrated concentric thickening of the distal stomach extending to the duodenum. The patient offers no GI complaints including abdominal pain, pyrosis, nausea or vomiting. She is scheduled for cervical spine surgery and wishes to have an endoscopy to rule out any pathology prior to her surgery.    Review of Systems: She has both sensory and motor loss in her upper extremities related to her cervical disc disease. Pertinent positive and negative review of systems were noted in the above HPI section. All other review of systems were otherwise negative.    Current Medications, Allergies, Past Medical History, Past Surgical History, Family History and Social History were reviewed in [employer] [facility] electronic medical record  Vital signs were reviewed in today's medical record. Physical Exam: General: Well developed , well nourished, no acute distress Head: Normocephalic and atraumatic Eyes:  sclerae anicteric, EOMI Ears: Normal auditory acuity Mouth: No deformity or lesions Lungs: Clear throughout to auscultation Heart: Regular rate and rhythm; no murmurs, rubs or bruits Abdomen: Soft, non tender and non distended. No masses, hepatosplenomegaly or hernias noted. Normal Bowel sounds Rectal:deferred Musculoskeletal: Symmetrical with no gross deformities  Pulses:  Normal pulses noted Extremities: No clubbing, cyanosis, edema or deformities noted Neurological: Alert oriented x 4, grossly nonfocal Psychological:  Alert and cooperative. Normal mood and affect

## 2011-03-13 NOTE — Patient Instructions (Signed)
Your Endoscopy is scheduled for 03/16/2011, please follow separate instructions.     Upper GI Endoscopy Upper GI endoscopy means using a flexible scope to look at the esophagus, stomach and upper small bowel. This is done to make a diagnosis in people with heartburn, abdominal pain, or abnormal bleeding. Sometimes an endoscope is needed to remove foreign bodies or food that become stuck in the esophagus; it can also be used to take biopsy samples. For the best results, do not eat or drink for 8 hours before having your upper endoscopy.  To perform the endoscopy, you will probably be sedated and your throat will be numbed with a special spray. The endoscope is then slowly passed down your throat (this will not interfere with your breathing). An endoscopy exam takes 15-30 minutes to complete and there is no real pain. Patients rarely remember much about the procedure. The results of the test may take several days if a biopsy or other test is taken.  You may have a sore throat after an endoscopy exam. Serious complications are very rare. Stick to liquids and soft foods until your pain is better. You should not drive a car or operate any dangerous equipment for at least 24 hours after being sedated. SEEK IMMEDIATE MEDICAL CARE IF:  You have severe throat pain.   You have shortness of breath.   You have bleeding problems.   You have a fever.   You have difficulty recovering from your sedation.  Document Released: 07/30/2004 Document Re-Released: 09/16/2009 [facility] Patient Information 2011 [place], [place].

## 2011-03-13 NOTE — Assessment & Plan Note (Signed)
Infiltrative disease such as lymphoma should be ruled out.  Recommendations #1 upper endoscopy

## 2011-03-16 ENCOUNTER — Ambulatory Visit (AMBULATORY_SURGERY_CENTER): Payer: Medicare Other | Admitting: Gastroenterology

## 2011-03-16 ENCOUNTER — Encounter: Payer: Self-pay | Admitting: Gastroenterology

## 2011-03-16 VITALS — BP 152/70 | HR 90 | Temp 99.1°F | Resp 14 | Ht 60.0 in | Wt 149.0 lb

## 2011-03-16 DIAGNOSIS — R9389 Abnormal findings on diagnostic imaging of other specified body structures: Secondary | ICD-10-CM

## 2011-03-16 MED ORDER — SODIUM CHLORIDE 0.9 % IV SOLN: 500.0000 mL | INTRAVENOUS | Status: DC

## 2011-03-16 NOTE — Patient Instructions (Signed)
See the picture page for your findings from your exam today.  Follow the green and blue discharge instruction sheets the rest of the day.  Resume your prior medications today. Please call if any questions or concerns.  

## 2011-03-16 NOTE — Progress Notes (Signed)
No complaints in the recovery room or on discharge.  maw 

## 2011-03-17 ENCOUNTER — Telehealth: Payer: Self-pay | Admitting: *Deleted

## 2011-03-17 NOTE — Telephone Encounter (Signed)

## 2011-03-20 ENCOUNTER — Ambulatory Visit: Payer: Self-pay | Admitting: Gastroenterology

## 2011-03-25 ENCOUNTER — Other Ambulatory Visit: Payer: Self-pay | Admitting: Internal Medicine

## 2011-04-23 ENCOUNTER — Other Ambulatory Visit: Payer: Self-pay | Admitting: Internal Medicine

## 2011-06-15 ENCOUNTER — Encounter: Payer: Self-pay | Admitting: Internal Medicine

## 2011-06-15 ENCOUNTER — Ambulatory Visit (INDEPENDENT_AMBULATORY_CARE_PROVIDER_SITE_OTHER): Payer: Medicare Other | Admitting: Internal Medicine

## 2011-06-15 VITALS — BP 116/80 | HR 111 | Temp 98.2°F | Resp 14 | Wt 149.6 lb

## 2011-06-15 DIAGNOSIS — R509 Fever, unspecified: Secondary | ICD-10-CM

## 2011-06-15 DIAGNOSIS — M549 Dorsalgia, unspecified: Secondary | ICD-10-CM

## 2011-06-15 DIAGNOSIS — R200 Anesthesia of skin: Secondary | ICD-10-CM

## 2011-06-15 DIAGNOSIS — M546 Pain in thoracic spine: Secondary | ICD-10-CM

## 2011-06-15 DIAGNOSIS — Z23 Encounter for immunization: Secondary | ICD-10-CM

## 2011-06-15 DIAGNOSIS — R209 Unspecified disturbances of skin sensation: Secondary | ICD-10-CM

## 2011-06-15 NOTE — Progress Notes (Signed)
  Subjective:    Patient ID: Pamela Blair, female    DOB: 08/15/1940, 70 y.o.   MRN: [id number]  HPI On 12/ 10/2010 she noticed redness at this operative site of the cervical discectomy and fusion. This began one day after having self-limited chills and sweats. These have not recurred and the redness resolved without treatment over a three-day span. The neurosurgical notes from the were reviewed; surgical response was felt to be excellent.  On 11/18 she experienced pain in the right trapezius muscle after reaching upward with the right arm. This resolved without treatment.  She is concerned that the postoperative surgical changes are "intact". She questions the need for more imaging  She has had increase in the numbness in her right hand. She has no urinary or stool incontinence     Review of Systems     Objective:   Physical Exam   She appears healthy well-nourished and in no acute distress.  She has no lymphadenopathy about the neck or axilla.  The operative site is well-healed. There is a tiny skin tag below the operative site. There is faint erythema of the upper chest in a curvilinear pattern. This blanches to pressure. She states this is a reaction to adhesive tape while hospitalized.  Exam reflexes and strength are normal. She has some discomfort with left right lateral neck rotation        Assessment & Plan:  #1 self-limited fever and sweats; no evidence of infection at the operative site  #2 positionally related right upper back pain, resolved  #3 increasing numbness in right hand.  Plan/discussion: She previously seen Dr.Nudelman, neurosurgeon in [place]; she is now followed at [facility]. I would encourage her to followup with a neurosurgeon a day. I do not see clinical evidence to warrant exposure to radiation associated with additional imaging. Physical therapy should be directed by Dr. Rise Mu

## 2011-06-15 NOTE — Progress Notes (Signed)
Addended by: Candie Echevaria L on: 06/15/2011 12:55 PM   Modules accepted: Orders

## 2011-06-15 NOTE — Patient Instructions (Signed)
.  Share results with all MDs seen  

## 2011-07-14 DIAGNOSIS — M5030 Other cervical disc degeneration, unspecified cervical region: Secondary | ICD-10-CM | POA: Diagnosis not present

## 2011-07-15 DIAGNOSIS — M5030 Other cervical disc degeneration, unspecified cervical region: Secondary | ICD-10-CM | POA: Diagnosis not present

## 2011-07-20 DIAGNOSIS — M5030 Other cervical disc degeneration, unspecified cervical region: Secondary | ICD-10-CM | POA: Diagnosis not present

## 2011-07-22 DIAGNOSIS — M5030 Other cervical disc degeneration, unspecified cervical region: Secondary | ICD-10-CM | POA: Diagnosis not present

## 2011-07-29 DIAGNOSIS — M5030 Other cervical disc degeneration, unspecified cervical region: Secondary | ICD-10-CM | POA: Diagnosis not present

## 2011-08-05 DIAGNOSIS — M5030 Other cervical disc degeneration, unspecified cervical region: Secondary | ICD-10-CM | POA: Diagnosis not present

## 2011-08-12 DIAGNOSIS — M5030 Other cervical disc degeneration, unspecified cervical region: Secondary | ICD-10-CM | POA: Diagnosis not present

## 2011-08-19 DIAGNOSIS — M5030 Other cervical disc degeneration, unspecified cervical region: Secondary | ICD-10-CM | POA: Diagnosis not present

## 2011-08-21 DIAGNOSIS — M5030 Other cervical disc degeneration, unspecified cervical region: Secondary | ICD-10-CM | POA: Diagnosis not present

## 2011-08-26 DIAGNOSIS — M5030 Other cervical disc degeneration, unspecified cervical region: Secondary | ICD-10-CM | POA: Diagnosis not present

## 2011-09-02 DIAGNOSIS — M5030 Other cervical disc degeneration, unspecified cervical region: Secondary | ICD-10-CM | POA: Diagnosis not present

## 2011-09-09 DIAGNOSIS — M5030 Other cervical disc degeneration, unspecified cervical region: Secondary | ICD-10-CM | POA: Diagnosis not present

## 2011-09-16 DIAGNOSIS — IMO0002 Reserved for concepts with insufficient information to code with codable children: Secondary | ICD-10-CM | POA: Diagnosis not present

## 2011-09-30 DIAGNOSIS — M5030 Other cervical disc degeneration, unspecified cervical region: Secondary | ICD-10-CM | POA: Diagnosis not present

## 2011-10-12 DIAGNOSIS — M5030 Other cervical disc degeneration, unspecified cervical region: Secondary | ICD-10-CM | POA: Diagnosis not present

## 2011-10-14 DIAGNOSIS — Z1231 Encounter for screening mammogram for malignant neoplasm of breast: Secondary | ICD-10-CM | POA: Diagnosis not present

## 2011-10-14 DIAGNOSIS — E669 Obesity, unspecified: Secondary | ICD-10-CM | POA: Diagnosis not present

## 2011-10-14 DIAGNOSIS — M810 Age-related osteoporosis without current pathological fracture: Secondary | ICD-10-CM | POA: Diagnosis not present

## 2011-10-14 DIAGNOSIS — Z124 Encounter for screening for malignant neoplasm of cervix: Secondary | ICD-10-CM | POA: Diagnosis not present

## 2011-10-21 DIAGNOSIS — M5030 Other cervical disc degeneration, unspecified cervical region: Secondary | ICD-10-CM | POA: Diagnosis not present

## 2011-10-27 DIAGNOSIS — M5030 Other cervical disc degeneration, unspecified cervical region: Secondary | ICD-10-CM | POA: Diagnosis not present

## 2011-11-16 ENCOUNTER — Encounter: Payer: Self-pay | Admitting: Internal Medicine

## 2011-11-18 DIAGNOSIS — E669 Obesity, unspecified: Secondary | ICD-10-CM | POA: Diagnosis not present

## 2011-12-14 ENCOUNTER — Other Ambulatory Visit: Payer: Self-pay | Admitting: Internal Medicine

## 2011-12-17 DIAGNOSIS — E669 Obesity, unspecified: Secondary | ICD-10-CM | POA: Diagnosis not present

## 2012-01-04 DIAGNOSIS — IMO0002 Reserved for concepts with insufficient information to code with codable children: Secondary | ICD-10-CM | POA: Diagnosis not present

## 2012-01-21 DIAGNOSIS — IMO0002 Reserved for concepts with insufficient information to code with codable children: Secondary | ICD-10-CM | POA: Diagnosis not present

## 2012-01-25 ENCOUNTER — Other Ambulatory Visit: Payer: Self-pay | Admitting: Internal Medicine

## 2012-01-28 DIAGNOSIS — IMO0002 Reserved for concepts with insufficient information to code with codable children: Secondary | ICD-10-CM | POA: Diagnosis not present

## 2012-02-11 DIAGNOSIS — M25569 Pain in unspecified knee: Secondary | ICD-10-CM | POA: Diagnosis not present

## 2012-02-16 DIAGNOSIS — R5381 Other malaise: Secondary | ICD-10-CM | POA: Diagnosis not present

## 2012-02-16 DIAGNOSIS — R5383 Other fatigue: Secondary | ICD-10-CM | POA: Diagnosis not present

## 2012-02-16 DIAGNOSIS — E669 Obesity, unspecified: Secondary | ICD-10-CM | POA: Diagnosis not present

## 2012-02-29 DIAGNOSIS — H52209 Unspecified astigmatism, unspecified eye: Secondary | ICD-10-CM | POA: Diagnosis not present

## 2012-02-29 DIAGNOSIS — H01009 Unspecified blepharitis unspecified eye, unspecified eyelid: Secondary | ICD-10-CM | POA: Diagnosis not present

## 2012-02-29 DIAGNOSIS — H04129 Dry eye syndrome of unspecified lacrimal gland: Secondary | ICD-10-CM | POA: Diagnosis not present

## 2012-02-29 DIAGNOSIS — H2510 Age-related nuclear cataract, unspecified eye: Secondary | ICD-10-CM | POA: Diagnosis not present

## 2012-03-03 DIAGNOSIS — M1710 Unilateral primary osteoarthritis, unspecified knee: Secondary | ICD-10-CM | POA: Diagnosis not present

## 2012-03-03 DIAGNOSIS — IMO0002 Reserved for concepts with insufficient information to code with codable children: Secondary | ICD-10-CM | POA: Diagnosis not present

## 2012-03-08 ENCOUNTER — Other Ambulatory Visit: Payer: Self-pay | Admitting: Internal Medicine

## 2012-03-29 DIAGNOSIS — IMO0002 Reserved for concepts with insufficient information to code with codable children: Secondary | ICD-10-CM | POA: Diagnosis not present

## 2012-03-29 DIAGNOSIS — M1710 Unilateral primary osteoarthritis, unspecified knee: Secondary | ICD-10-CM | POA: Diagnosis not present

## 2012-04-07 ENCOUNTER — Other Ambulatory Visit: Payer: Self-pay | Admitting: Internal Medicine

## 2012-04-29 DIAGNOSIS — M1710 Unilateral primary osteoarthritis, unspecified knee: Secondary | ICD-10-CM | POA: Diagnosis not present

## 2012-04-29 DIAGNOSIS — IMO0002 Reserved for concepts with insufficient information to code with codable children: Secondary | ICD-10-CM | POA: Diagnosis not present

## 2012-05-17 ENCOUNTER — Other Ambulatory Visit: Payer: Self-pay | Admitting: Internal Medicine

## 2012-05-17 NOTE — Telephone Encounter (Signed)
Rx sent.    MW 

## 2012-06-17 ENCOUNTER — Other Ambulatory Visit: Payer: Self-pay | Admitting: Internal Medicine

## 2012-06-17 NOTE — Telephone Encounter (Signed)
Patient DUE for CPX

## 2012-06-22 DIAGNOSIS — IMO0002 Reserved for concepts with insufficient information to code with codable children: Secondary | ICD-10-CM | POA: Diagnosis not present

## 2012-06-22 DIAGNOSIS — M1710 Unilateral primary osteoarthritis, unspecified knee: Secondary | ICD-10-CM | POA: Diagnosis not present

## 2012-06-24 ENCOUNTER — Other Ambulatory Visit: Payer: Self-pay | Admitting: Internal Medicine

## 2012-08-04 ENCOUNTER — Other Ambulatory Visit: Payer: Self-pay | Admitting: Internal Medicine

## 2012-08-04 NOTE — Telephone Encounter (Signed)
Patient needs to schedule a CPX and fasting labs  

## 2012-09-06 DIAGNOSIS — IMO0002 Reserved for concepts with insufficient information to code with codable children: Secondary | ICD-10-CM | POA: Diagnosis not present

## 2012-09-06 DIAGNOSIS — M1710 Unilateral primary osteoarthritis, unspecified knee: Secondary | ICD-10-CM | POA: Diagnosis not present

## 2012-09-12 ENCOUNTER — Other Ambulatory Visit: Payer: Self-pay | Admitting: Internal Medicine

## 2012-09-12 NOTE — Telephone Encounter (Signed)
Called patient on mobil phone, per husband call patient on home phone. I called home phone (no answer), unable to leave voice message. Letter mailed informing patient she is overdue for labs and follow-up appointment.

## 2012-10-17 DIAGNOSIS — M1710 Unilateral primary osteoarthritis, unspecified knee: Secondary | ICD-10-CM | POA: Diagnosis not present

## 2012-10-17 DIAGNOSIS — IMO0002 Reserved for concepts with insufficient information to code with codable children: Secondary | ICD-10-CM | POA: Diagnosis not present

## 2012-11-08 DIAGNOSIS — M6580 Other synovitis and tenosynovitis, unspecified site: Secondary | ICD-10-CM | POA: Diagnosis not present

## 2012-11-08 DIAGNOSIS — M1710 Unilateral primary osteoarthritis, unspecified knee: Secondary | ICD-10-CM | POA: Diagnosis not present

## 2012-11-09 ENCOUNTER — Other Ambulatory Visit: Payer: Self-pay | Admitting: Internal Medicine

## 2012-11-09 DIAGNOSIS — R5381 Other malaise: Secondary | ICD-10-CM

## 2012-11-09 DIAGNOSIS — E785 Hyperlipidemia, unspecified: Secondary | ICD-10-CM

## 2012-11-09 DIAGNOSIS — E876 Hypokalemia: Secondary | ICD-10-CM

## 2012-11-09 DIAGNOSIS — R7309 Other abnormal glucose: Secondary | ICD-10-CM

## 2012-11-09 DIAGNOSIS — E559 Vitamin D deficiency, unspecified: Secondary | ICD-10-CM

## 2012-11-09 DIAGNOSIS — R5383 Other fatigue: Secondary | ICD-10-CM

## 2012-11-10 NOTE — Telephone Encounter (Signed)
Med denied pt has a lab appt tomorrow and OV on 11/14/12.

## 2012-11-11 ENCOUNTER — Other Ambulatory Visit: Payer: Medicare Other

## 2012-11-11 ENCOUNTER — Other Ambulatory Visit (INDEPENDENT_AMBULATORY_CARE_PROVIDER_SITE_OTHER): Payer: Medicare Other

## 2012-11-11 DIAGNOSIS — E785 Hyperlipidemia, unspecified: Secondary | ICD-10-CM

## 2012-11-11 DIAGNOSIS — R7309 Other abnormal glucose: Secondary | ICD-10-CM

## 2012-11-11 DIAGNOSIS — R5383 Other fatigue: Secondary | ICD-10-CM | POA: Diagnosis not present

## 2012-11-11 DIAGNOSIS — E876 Hypokalemia: Secondary | ICD-10-CM

## 2012-11-11 DIAGNOSIS — R5381 Other malaise: Secondary | ICD-10-CM | POA: Diagnosis not present

## 2012-11-11 LAB — CBC WITH DIFFERENTIAL/PLATELET
Eosinophils Relative: 0.4 % (ref 0.0–5.0)
HCT: 38.7 % (ref 36.0–46.0)
Lymphocytes Relative: 33.3 % (ref 12.0–46.0)
Lymphs Abs: 3.1 10*3/uL (ref 0.7–4.0)
Monocytes Relative: 5.9 % (ref 3.0–12.0)
Neutrophils Relative %: 59.9 % (ref 43.0–77.0)
Platelets: 291 10*3/uL (ref 150.0–400.0)
WBC: 9.2 10*3/uL (ref 4.5–10.5)

## 2012-11-11 LAB — HEPATIC FUNCTION PANEL
ALT: 18 U/L (ref 0–35)
AST: 16 U/L (ref 0–37)
Alkaline Phosphatase: 41 U/L (ref 39–117)
Bilirubin, Direct: 0 mg/dL (ref 0.0–0.3)
Total Bilirubin: 0.3 mg/dL (ref 0.3–1.2)

## 2012-11-11 LAB — BASIC METABOLIC PANEL
BUN: 19 mg/dL (ref 6–23)
CO2: 22 mEq/L (ref 19–32)
Chloride: 105 mEq/L (ref 96–112)
Creatinine, Ser: 0.7 mg/dL (ref 0.4–1.2)
Glucose, Bld: 81 mg/dL (ref 70–99)

## 2012-11-11 LAB — LIPID PANEL
Total CHOL/HDL Ratio: 4
Triglycerides: 85 mg/dL (ref 0.0–149.0)
VLDL: 17 mg/dL (ref 0.0–40.0)

## 2012-11-12 ENCOUNTER — Encounter: Payer: Self-pay | Admitting: Internal Medicine

## 2012-11-15 ENCOUNTER — Encounter: Payer: Self-pay | Admitting: Lab

## 2012-11-16 ENCOUNTER — Ambulatory Visit (INDEPENDENT_AMBULATORY_CARE_PROVIDER_SITE_OTHER): Payer: Medicare Other | Admitting: Internal Medicine

## 2012-11-16 ENCOUNTER — Encounter: Payer: Self-pay | Admitting: Internal Medicine

## 2012-11-16 VITALS — BP 138/80 | HR 77 | Wt 137.0 lb

## 2012-11-16 DIAGNOSIS — I4949 Other premature depolarization: Secondary | ICD-10-CM | POA: Diagnosis not present

## 2012-11-16 DIAGNOSIS — E876 Hypokalemia: Secondary | ICD-10-CM | POA: Diagnosis not present

## 2012-11-16 DIAGNOSIS — E785 Hyperlipidemia, unspecified: Secondary | ICD-10-CM | POA: Diagnosis not present

## 2012-11-16 NOTE — Progress Notes (Signed)
Subjective:    Patient ID: Pamela Blair, female    DOB: 1941/03/30, 72 y.o.   MRN: [id number]  HPI She returns to assess her lipid status; she had been on pravastatin until  last month. She questions whether she had some swelling in her throat when she takes this. She makes a statement that "my throat is not close off completely".  Advanced cholesterol testing reveals that her LDL goal is less than 100. Her most recent lipids reveal an LDL of 158.8, 1 month off statin. Her HDL is protective at 62.7  She has a nondiabetic A1c @ 5.1%. TSH is therapeutic.  Her potassium level was low at 3.4 on 5/9. Renal function is excellent. Reviewing her medications indicates that she isn't on any medications which should cause hypokalemia.    Review of Systems She is not on a heart healthy diet; she does not exercise due to knee issues for which she sees Dr Charlann Boxer. He has recommended TKR. A second Orthopedist gave her steroid injection 5/6 with benefit. She denies chest pain, palpitations, dyspnea, or claudication. Family history is negative for premature coronary disease.  Significant abdominal symptoms, memory deficit, or myalgias denied. .     Objective:   Physical Exam  Gen.: Healthy and well-nourished in appearance. Alert, appropriate and cooperative throughout exam. Appears younger than stated age   Eyes: No corneal or conjunctival inflammation noted. No icterus Nose: External nasal exam reveals no deformity or inflammation. Nasal mucosa are pink and moist. No lesions or exudates noted.   Mouth: Oral mucosa and oropharynx reveal no lesions or exudates. Teeth in good repair. Neck: No deformities, masses, or tenderness noted.  Thyroid normal. Lungs: Normal respiratory effort; chest expands symmetrically. Lungs are clear to auscultation without rales, wheezes, or increased work of breathing. Heart: Normal rate and rhythm. Normal S1 and S2. No gallop, click, or rub. Grade 1/2-1 over 6 systolic murmur.  Occasional premature beat Abdomen: Bowel sounds normal; abdomen soft and nontender. No masses, organomegaly or hernias noted.                                 Musculoskeletal/extremities: There is some asymmetry of the posterior thoracic musculature suggesting occult scoliosis. No clubbing, cyanosis or edema. Valgus deformity of the knees is present. There is marked fusiform change in the right with significant crepitus and effusion .Tone & strength  Normal. Finger joints normal. Nail health good. Able to lie down & sit up w/o help. Negative SLR bilaterally Vascular: Carotid, radial artery, dorsalis pedis and  posterior tibial pulses are full and equal. No bruits present. Neurologic: Alert and oriented x3.        Skin: Intact without suspicious lesions or rashes. Lymph: No cervical, axillary lymphadenopathy present. Psych: Mood and affect are normal. Normally interactive                                                                                       Assessment & Plan:  #1 dyslipidemia; present LDL increases risk of cardiovascular/neurovascular event approximately 50%. The present LDL apparently was collected one month off  statin. It is unclear whether she is having significant angioedema symptoms with the statin. If so it cannot be continued.  #2 hypokalemia; is possible this is related to the steroid injection she received 5/7  #3 slight dysrhythmia due to premature beats. No premature beats noted on EKG; but diffuse nonspecific ST-T changes.  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  Plan: See orders and recommendations

## 2012-11-16 NOTE — Patient Instructions (Addendum)
To increase  Potassium (K+) increase citrus fruits & bananas in diet and use the salt substitute No Salt, which contains  potassium , to season food @ the table. Recheck K+ after 3-4 weeks . Code :276.8  Take the EKG to any emergency room or preop visits. There are nonspecific changes; as long as there is no new change these are not clinically significant . If the old EKG is not available for comparison; it may result in unnecessary hospitalization for observation with significant unnecessary expense.  Please  schedule fasting Labs in 10 weeks after resuming Pravastatin: K+,Lipids, hepatic panel, CK. PLEASE BRING THESE INSTRUCTIONS TO FOLLOW UP  LAB APPOINTMENT.This will guarantee correct labs are drawn, eliminating need for repeat blood sampling ( needle sticks ! ). Diagnoses /Codes:276.8,272.4,995.20.

## 2012-12-21 ENCOUNTER — Other Ambulatory Visit: Payer: Self-pay | Admitting: *Deleted

## 2012-12-21 MED ORDER — PRAVASTATIN SODIUM 20 MG PO TABS: ORAL_TABLET | ORAL | Status: DC

## 2012-12-21 NOTE — Telephone Encounter (Signed)
Rx sent 

## 2013-02-08 DIAGNOSIS — M1710 Unilateral primary osteoarthritis, unspecified knee: Secondary | ICD-10-CM | POA: Diagnosis not present

## 2013-02-08 DIAGNOSIS — M6580 Other synovitis and tenosynovitis, unspecified site: Secondary | ICD-10-CM | POA: Diagnosis not present

## 2013-03-30 DIAGNOSIS — M1710 Unilateral primary osteoarthritis, unspecified knee: Secondary | ICD-10-CM | POA: Diagnosis not present

## 2013-05-09 DIAGNOSIS — M2240 Chondromalacia patellae, unspecified knee: Secondary | ICD-10-CM | POA: Diagnosis not present

## 2013-05-09 DIAGNOSIS — M1610 Unilateral primary osteoarthritis, unspecified hip: Secondary | ICD-10-CM | POA: Diagnosis not present

## 2013-05-09 DIAGNOSIS — M545 Low back pain, unspecified: Secondary | ICD-10-CM | POA: Diagnosis not present

## 2013-05-09 DIAGNOSIS — M1710 Unilateral primary osteoarthritis, unspecified knee: Secondary | ICD-10-CM | POA: Diagnosis not present

## 2013-05-31 ENCOUNTER — Other Ambulatory Visit: Payer: Self-pay | Admitting: *Deleted

## 2013-05-31 MED ORDER — PRAVASTATIN SODIUM 20 MG PO TABS: ORAL_TABLET | ORAL | Status: DC

## 2013-05-31 NOTE — Telephone Encounter (Signed)
Pravastatin refilled per protocol 

## 2013-06-08 DIAGNOSIS — S7000XA Contusion of unspecified hip, initial encounter: Secondary | ICD-10-CM | POA: Diagnosis not present

## 2013-06-14 ENCOUNTER — Ambulatory Visit (INDEPENDENT_AMBULATORY_CARE_PROVIDER_SITE_OTHER): Payer: Medicare Other | Admitting: Internal Medicine

## 2013-06-14 ENCOUNTER — Encounter: Payer: Self-pay | Admitting: Internal Medicine

## 2013-06-14 VITALS — BP 137/78 | HR 76 | Temp 98.1°F | Wt 136.0 lb

## 2013-06-14 DIAGNOSIS — M79609 Pain in unspecified limb: Secondary | ICD-10-CM | POA: Diagnosis not present

## 2013-06-14 DIAGNOSIS — M79602 Pain in left arm: Secondary | ICD-10-CM

## 2013-06-14 DIAGNOSIS — R110 Nausea: Secondary | ICD-10-CM | POA: Diagnosis not present

## 2013-06-14 DIAGNOSIS — M549 Dorsalgia, unspecified: Secondary | ICD-10-CM

## 2013-06-14 DIAGNOSIS — Z01419 Encounter for gynecological examination (general) (routine) without abnormal findings: Secondary | ICD-10-CM | POA: Diagnosis not present

## 2013-06-14 DIAGNOSIS — M899 Disorder of bone, unspecified: Secondary | ICD-10-CM | POA: Diagnosis not present

## 2013-06-14 DIAGNOSIS — M47812 Spondylosis without myelopathy or radiculopathy, cervical region: Secondary | ICD-10-CM

## 2013-06-14 DIAGNOSIS — M546 Pain in thoracic spine: Secondary | ICD-10-CM

## 2013-06-14 DIAGNOSIS — R5381 Other malaise: Secondary | ICD-10-CM | POA: Diagnosis not present

## 2013-06-14 DIAGNOSIS — R079 Chest pain, unspecified: Secondary | ICD-10-CM

## 2013-06-14 LAB — TROPONIN I: Troponin I: 0.01 ng/mL (ref <0.06)

## 2013-06-14 LAB — D-DIMER, QUANTITATIVE (NOT AT ARMC): D-Dimer, Quant: 0.56 ug/mL-FEU — ABNORMAL HIGH (ref 0.00–0.48)

## 2013-06-14 MED ORDER — TRAMADOL HCL 50 MG PO TABS: 50.0000 mg | ORAL_TABLET | Freq: Three times a day (TID) | ORAL | Status: DC | PRN

## 2013-06-14 NOTE — Progress Notes (Signed)
Subjective:    Patient ID: Pamela Blair, female    DOB: 1941-02-14, 72 y.o.   MRN: [id number]  HPI  Pain began in the left upper back about 10 PM last night and lasted approximately one hour. Subsequently she developed pain in the left arm which lasted approximately 5 minutes ,up to a level 8.  The pain in the back was described as sharp and electric up to 10.  She took a small dose aspirin and then subsequently took Aleve.  She denied any associated chest pain, nausea, or sweating until nausea this am   Approximately 10 days ago she had swelling of her ankles after flying back from [place]. This resolved within 24 hours. It was not associated with calf pain, chest pain, shortness of breath.  In September 2012 an upper endoscopy for thickening of the stomach noted on CAT scan. The study was negative.  She has had cervical spine surgery.  She takes Actonel once a month. She has not taken this for a month.  There is history of stroke in her mother and heart attack in  her father, neither were premature.    Review of Systems  She has not had fever, chills, sweats, unexplained weight loss, or excessive fatigue  She's had no visual changes, headache, passing out  She also denies tachycardia or palpitations.  She's had no associated abdominal pain, melena, rectal bleeding  There was no associated weakness, numbness, or tingling in her extremities  There is no rash or color change in the area of the symptoms.      Objective:   Physical Exam Gen.: Healthy and well-nourished in appearance. Alert, appropriate and cooperative throughout exam.Appears younger than stated age  Head: Normocephalic without obvious abnormalities  Eyes: No corneal or conjunctival inflammation noted. Pupils equal round reactive to light and accommodation. Extraocular motion intact. No icterus  Nose: External nasal exam reveals no deformity or inflammation. Nasal mucosa are pink and moist. No lesions or  exudates noted.  Mouth: Oral mucosa and oropharynx reveal no lesions or exudates. Teeth in good repair. Neck: No deformities, masses, or tenderness noted. Range of motion surprisingly good Thyroid normal. Lungs: Normal respiratory effort; chest expands symmetrically. Lungs are clear to auscultation without rales, wheezes, or increased work of breathing. Heart: Normal rate and rhythm. Normal S1 and S2. No gallop, click, or rub. S4 w/o murmur. Abdomen: Bowel sounds normal; abdomen soft and nontender. No masses, organomegaly or hernias noted.                                   Musculoskeletal/extremities:   There is significant asymmetry of the posterior thoracic musculature suggesting occult scoliosis. No clubbing, cyanosis, edema, or significant extremity  deformity noted. Range of motion normal .Tone & strength normal. Hand joints normal . Fingernail  health good. Fusiform changes the right knee with crepitus and effusion. Able to lie down & sit up w/o help. Negative SLR bilaterally Vascular: Carotid, radial artery, dorsalis pedis and  posterior tibial pulses are full and equal. No bruits present. Homans sign negative bilaterally. Neurologic: Alert and oriented x3. Deep tendon reflexes symmetrical and normal.        Skin: Intact without suspicious lesions or rashes. Lymph: No cervical, axillary lymphadenopathy present. Psych: Mood and affect are normal. 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 EKG reveals no ischemic changes.  #2 recent history of edema of the ankles post flight; this has resolved  Plan: See orders

## 2013-06-14 NOTE — Patient Instructions (Signed)
Your next office appointment will be determined based upon review of your pending labs &/ or x-rays. Those instructions will be transmitted to you through My Chart  or by mail if you're not using this system. Please report any significant change in your symptoms.

## 2013-06-14 NOTE — Progress Notes (Signed)
Pre visit review using our clinic review tool, if applicable. No additional management support is needed unless otherwise documented below in the visit note. 

## 2013-06-15 ENCOUNTER — Encounter: Payer: Self-pay | Admitting: Internal Medicine

## 2013-06-19 ENCOUNTER — Encounter: Payer: Self-pay | Admitting: *Deleted

## 2013-06-22 DIAGNOSIS — M810 Age-related osteoporosis without current pathological fracture: Secondary | ICD-10-CM | POA: Diagnosis not present

## 2013-06-22 DIAGNOSIS — Z1231 Encounter for screening mammogram for malignant neoplasm of breast: Secondary | ICD-10-CM | POA: Diagnosis not present

## 2013-07-09 ENCOUNTER — Encounter (HOSPITAL_COMMUNITY): Payer: Self-pay | Admitting: Emergency Medicine

## 2013-07-09 ENCOUNTER — Emergency Department (INDEPENDENT_AMBULATORY_CARE_PROVIDER_SITE_OTHER): Payer: Medicare Other

## 2013-07-09 ENCOUNTER — Emergency Department (INDEPENDENT_AMBULATORY_CARE_PROVIDER_SITE_OTHER)
Admission: EM | Admit: 2013-07-09 | Discharge: 2013-07-09 | Disposition: A | Discharge status: Home / Self Care | Payer: Medicare Other | Source: Home / Self Care | Attending: Family Medicine | Admitting: Family Medicine

## 2013-07-09 DIAGNOSIS — R05 Cough: Secondary | ICD-10-CM | POA: Diagnosis not present

## 2013-07-09 DIAGNOSIS — R059 Cough, unspecified: Secondary | ICD-10-CM | POA: Diagnosis not present

## 2013-07-09 DIAGNOSIS — R509 Fever, unspecified: Secondary | ICD-10-CM | POA: Diagnosis not present

## 2013-07-09 DIAGNOSIS — J189 Pneumonia, unspecified organism: Secondary | ICD-10-CM

## 2013-07-09 LAB — POCT URINALYSIS DIP (DEVICE)
Glucose, UA: NEGATIVE mg/dL
KETONES UR: 40 mg/dL — AB
Nitrite: NEGATIVE
Protein, ur: 100 mg/dL — AB
SPECIFIC GRAVITY, URINE: 1.02 (ref 1.005–1.030)
Urobilinogen, UA: 1 mg/dL (ref 0.0–1.0)
pH: 6 (ref 5.0–8.0)

## 2013-07-09 MED ORDER — ACETAMINOPHEN 325 MG PO TABS
650.0000 mg | ORAL_TABLET | Freq: Once | ORAL | Status: AC
  Administered 2013-07-09: 650 mg via ORAL

## 2013-07-09 MED ORDER — ACETAMINOPHEN 325 MG PO TABS
ORAL_TABLET | ORAL | Status: AC
  Filled 2013-07-09: qty 2

## 2013-07-09 MED ORDER — LEVOFLOXACIN 500 MG PO TABS: 500.0000 mg | ORAL_TABLET | Freq: Every day | ORAL | Status: DC

## 2013-07-09 NOTE — ED Notes (Signed)
C/O runny nose, body aches, fevers x 3 days, chest congestion with cough.  Denies v/d.  Also c/o feeling "bloated" with abdominal pressure and right flank pain.  Has been drinking plenty of fluids but still has "dark urine".  Has been taking Tyl and Theraflu - last dose of any meds during night.  Crackles noted right base.

## 2013-07-09 NOTE — ED Provider Notes (Signed)
Pamela Blair is a 73 y.o. female who presents to Urgent Care today for one week of fever chills body aches cough congestion and runny nose. No significant shortness of breath nausea vomiting or diarrhea. Patient has been taking over-the-counter combination medications which have helped a bit. Fevers have been controlled Tylenol. Eating and drinking well continuing to urinate. She is acting normally per her son.   Past Medical History  Diagnosis Date  . Cervical spondylosis   . Osteoporosis   . Cervical spinal stenosis   . Hyperlipidemia    History  Substance Use Topics  . Smoking status: Never Smoker   . Smokeless tobacco: Not on file  . Alcohol Use: No   ROS as above Medications reviewed. No current facility-administered medications for this encounter.   Current Outpatient Prescriptions  Medication Sig Dispense Refill  . [DISCONTINUED] pravastatin (PRAVACHOL) 20 MG tablet 1 by mouth daily  30 tablet  5  . levofloxacin (LEVAQUIN) 500 MG tablet Take 1 tablet (500 mg total) by mouth daily.  7 tablet  0  . [DISCONTINUED] buPROPion (WELLBUTRIN XL) 300 MG 24 hr tablet Take 300 mg by mouth daily.          Exam:  BP 106/49  Pulse 93  Temp(Src) 101.3 F (38.5 C) (Oral)  Resp 16  SpO2 100% Gen: Well NAD HEENT: EOMI,  MMM, posterior pharynx with mild cobblestoning. Tympanic membranes are normal appearing bilaterally Lungs: Normal work of breathing. Coarse breath sounds right lower lung. Clear otherwise Heart: RRR no MRG Abd: NABS, Soft. NT, ND no CV angle tenderness to percussion Exts: Non edematous BL  LE, warm and well perfused.   Results for orders placed during the hospital encounter of 07/09/13 (from the past 24 hour(s))  POCT URINALYSIS DIP (DEVICE)     Status: Abnormal   Collection Time    07/09/13 11:09 AM      Result Value Range   Glucose, UA NEGATIVE  NEGATIVE mg/dL   Bilirubin Urine SMALL (*) NEGATIVE   Ketones, ur 40 (*) NEGATIVE mg/dL   Specific Gravity, Urine  1.020  1.005 - 1.030   Hgb urine dipstick TRACE (*) NEGATIVE   pH 6.0  5.0 - 8.0   Protein, ur 100 (*) NEGATIVE mg/dL   Urobilinogen, UA 1.0  0.0 - 1.0 mg/dL   Nitrite NEGATIVE  NEGATIVE   Leukocytes, UA TRACE (*) NEGATIVE   Dg Chest 2 View  07/09/2013   CLINICAL DATA:  Cough and fever.  EXAM: CHEST  2 VIEW  COMPARISON:  No priors.  FINDINGS: Focal peribronchial cuffing in the right lower lobe with some ill-defined interstitial opacities in this region, concerning for sequela of mild aspiration or developing bronchopneumonia. Lungs are otherwise clear. Probable trace right pleural effusion. No evidence of pulmonary edema. Heart size is normal. Mediastinal contours are within normal limits. Atherosclerosis in the thoracic aorta. Orthopedic fixation hardware in the lower cervical spine incidentally noted.  IMPRESSION: 1. Findings, as above, concerning for either early right lower lobe bronchopneumonia or sequela of mild aspiration. Trace right pleural effusion. 2. Atherosclerosis.   Electronically Signed   By: Vinnie Langton M.D.   On: 07/09/2013 11:29    Assessment and Plan: 73 y.o. female with right lower lobe community-acquired pneumonia. Plan to treat with Levaquin. I think the urine is likely contaminated but we have obtained a urine culture as well. Recommend followup with primary care provider. Discussed warning signs or symptoms. Please see discharge instructions. Patient expresses understanding. Continue  Tylenol or ibuprofen     Gregor Hams, MD 07/09/13 1216

## 2013-07-09 NOTE — Discharge Instructions (Signed)
Thank you for coming in today. Take levofloxacin daily for 7 days.  Continue Tylenol for pain fevers or chills. Followup with your Dr. Soon If you're getting worse go to the emergency room Call or go to the emergency room if you get worse, have trouble breathing, have chest pains, or palpitations.

## 2013-07-10 LAB — URINE CULTURE
CULTURE: NO GROWTH
Colony Count: NO GROWTH
SPECIAL REQUESTS: NORMAL

## 2013-07-12 ENCOUNTER — Ambulatory Visit (INDEPENDENT_AMBULATORY_CARE_PROVIDER_SITE_OTHER): Payer: Medicare Other | Admitting: Internal Medicine

## 2013-07-12 ENCOUNTER — Encounter: Payer: Self-pay | Admitting: Internal Medicine

## 2013-07-12 VITALS — BP 114/71 | HR 94 | Temp 98.0°F | Resp 18 | Wt 133.0 lb

## 2013-07-12 DIAGNOSIS — J189 Pneumonia, unspecified organism: Secondary | ICD-10-CM | POA: Diagnosis not present

## 2013-07-12 MED ORDER — HYDROCODONE-HOMATROPINE 5-1.5 MG/5ML PO SYRP: 5.0000 mL | ORAL_SOLUTION | Freq: Four times a day (QID) | ORAL | Status: DC | PRN

## 2013-07-12 NOTE — Progress Notes (Signed)
   Subjective:    Patient ID: Pamela Blair, female    DOB: Apr 22, 1941, 73 y.o.   MRN: [id number]  HPI   She was seen at the urgent care 07/09/13 after 4 days of fever, chills, diffuse myalgias/arthralgias, and rhinitis. At that time her temperature was 101.3; she states it is been as high as 103  Urine revealed trace leukocytes but subsequent culture revealed no growth  Chest x-ray shows the right hemidiaphragm to be ill distinct suggesting right lower lobe pneumonia. Films reviewed with her. Levaquin was prescribed; she has 3 days left.     She did not take the flu shot this year.    Review of Systems  At this time she denies frontal sinus pain, facial pain, nasal purulence, dental pain, sore throat, otic pain, or otic discharge.Some temporal headache with cough. She does have clear nasal discharge.  Cough productive of clear secretions. She denies associated pleuritic chest pain, wheezing, shortness of breath      Objective:   Physical Exam  General appearance:appears fatigued but adequately nourished; no acute distress or increased work of breathing is present.  No  lymphadenopathy about the head, neck, or axilla noted.   Eyes: No conjunctival inflammation or lid edema is present. There is no scleral icterus.  Ears:  External ear exam shows no significant lesions or deformities.  Otoscopic examination reveals clear canals, tympanic membranes are intact bilaterally without bulging, retraction, inflammation or discharge.  Nose:  External nasal examination shows no deformity or inflammation. Nasal mucosa are pink and moist without lesions or exudates. No septal dislocation or deviation.No obstruction to airflow.   Oral exam: Dental hygiene is good; lips and gums are healthy appearing.There is no oropharyngeal erythema or exudate noted.   Neck:  No deformities, masses, or tenderness noted.   Supple with full range of motion without pain.   Heart:  Normal rate and regular rhythm. S1  and S2 normal without gallop, murmur, click, rub or other extra sounds.   Lungs: Rales @ R base & rattly cough.No wheezes, rhonchi or rubs present.No increased work of breathing.    Extremities:  No cyanosis, edema, or clubbing  noted    Skin: Warm & dry w/o jaundice or tenting.         Assessment & Plan:  #1 CAP ; R/O preexisting flu See orders

## 2013-07-12 NOTE — Progress Notes (Signed)
Pre-visit discussion using our clinic review tool. No additional management support is needed unless otherwise documented below in the visit note.  

## 2013-07-12 NOTE — Patient Instructions (Signed)
Please  blowup at least 10  balloons a day to enhance inflation of the lungs and prevent atelectasis as we discussed. Order for x-rays entered into  the computer for tomorrow; these will be performed at [place]. across from [facility]. No appointment is necessary.

## 2013-07-13 ENCOUNTER — Other Ambulatory Visit: Payer: Self-pay | Admitting: Internal Medicine

## 2013-07-13 ENCOUNTER — Ambulatory Visit (INDEPENDENT_AMBULATORY_CARE_PROVIDER_SITE_OTHER)
Admission: RE | Admit: 2013-07-13 | Discharge: 2013-07-13 | Disposition: A | Discharge status: Home / Self Care | Payer: Medicare Other | Source: Ambulatory Visit | Attending: Internal Medicine | Admitting: Internal Medicine

## 2013-07-13 DIAGNOSIS — J189 Pneumonia, unspecified organism: Secondary | ICD-10-CM

## 2013-07-13 LAB — CBC WITH DIFFERENTIAL/PLATELET
BASOS ABS: 0.1 10*3/uL (ref 0.0–0.1)
BASOS PCT: 0.7 % (ref 0.0–3.0)
Eosinophils Absolute: 0.2 10*3/uL (ref 0.0–0.7)
Eosinophils Relative: 2.2 % (ref 0.0–5.0)
HEMATOCRIT: 38.5 % (ref 36.0–46.0)
Hemoglobin: 12.9 g/dL (ref 12.0–15.0)
LYMPHS ABS: 2.4 10*3/uL (ref 0.7–4.0)
Lymphocytes Relative: 25.6 % (ref 12.0–46.0)
MCHC: 33.6 g/dL (ref 30.0–36.0)
MCV: 88.2 fl (ref 78.0–100.0)
MONOS PCT: 9.1 % (ref 3.0–12.0)
Monocytes Absolute: 0.8 10*3/uL (ref 0.1–1.0)
Neutro Abs: 5.7 10*3/uL (ref 1.4–7.7)
Neutrophils Relative %: 62.4 % (ref 43.0–77.0)
Platelets: 340 10*3/uL (ref 150.0–400.0)
RBC: 4.36 Mil/uL (ref 3.87–5.11)
RDW: 15.5 % — AB (ref 11.5–14.6)
WBC: 9.2 10*3/uL (ref 4.5–10.5)

## 2013-07-13 NOTE — Addendum Note (Signed)
Addended by: Reino Bellis on: 07/13/2013 02:38 PM   Modules accepted: Orders

## 2013-07-13 NOTE — Addendum Note (Signed)
Addended by: Reino Bellis on: 07/13/2013 02:50 PM   Modules accepted: Orders

## 2013-07-14 ENCOUNTER — Telehealth: Payer: Self-pay | Admitting: Internal Medicine

## 2013-07-14 MED ORDER — LEVOFLOXACIN 500 MG PO TABS: 500.0000 mg | ORAL_TABLET | Freq: Every day | ORAL | Status: DC

## 2013-07-14 NOTE — Telephone Encounter (Signed)
Levaquin 500 qd # 5

## 2013-07-14 NOTE — Telephone Encounter (Signed)
Med sent to pharmacy.

## 2013-07-14 NOTE — Telephone Encounter (Signed)
Patient is calling to request the results of her recent labs.

## 2013-07-14 NOTE — Addendum Note (Signed)
Addended by: Beckie Busing on: 07/14/2013 04:39 PM   Modules accepted: Orders

## 2013-07-14 NOTE — Telephone Encounter (Signed)
Spoke with the pt and she stated that she will be taking her last ATB on tomorrow, so she want to know if she should continue on the ATB.  She stated that she was told to wait to see what her chest x-ray shows.  Pt stated that she has not felt good today.  No fever.  Please advise.//AB/CMA

## 2013-07-17 ENCOUNTER — Encounter: Payer: Self-pay | Admitting: *Deleted

## 2013-07-24 ENCOUNTER — Ambulatory Visit (INDEPENDENT_AMBULATORY_CARE_PROVIDER_SITE_OTHER)
Admission: RE | Admit: 2013-07-24 | Discharge: 2013-07-24 | Disposition: A | Discharge status: Home / Self Care | Payer: Medicare Other | Source: Ambulatory Visit | Attending: Internal Medicine | Admitting: Internal Medicine

## 2013-07-24 DIAGNOSIS — J984 Other disorders of lung: Secondary | ICD-10-CM | POA: Diagnosis not present

## 2013-07-24 DIAGNOSIS — J189 Pneumonia, unspecified organism: Secondary | ICD-10-CM | POA: Diagnosis not present

## 2013-07-25 ENCOUNTER — Telehealth: Payer: Self-pay | Admitting: *Deleted

## 2013-07-25 NOTE — Telephone Encounter (Signed)
Spoke with patient and made aware of CXR results. Advised to continue to blow up balloons several times a day as instructed by Dr, Linna Darner.

## 2013-07-25 NOTE — Telephone Encounter (Signed)
Message copied by Chilton Greathouse on Tue Jul 25, 2013  8:57 AM ------      Message from: Hendricks Limes      Created: Mon Jul 24, 2013  5:27 PM       The chest x-ray continues to improve. I do not recommend additional antibiotics but I would continue to blow up the balloons to enhance complete healing. ------

## 2013-07-26 DIAGNOSIS — Z13220 Encounter for screening for lipoid disorders: Secondary | ICD-10-CM | POA: Diagnosis not present

## 2013-07-26 DIAGNOSIS — M810 Age-related osteoporosis without current pathological fracture: Secondary | ICD-10-CM | POA: Diagnosis not present

## 2013-07-27 DIAGNOSIS — M1710 Unilateral primary osteoarthritis, unspecified knee: Secondary | ICD-10-CM | POA: Diagnosis not present

## 2013-07-27 DIAGNOSIS — M2240 Chondromalacia patellae, unspecified knee: Secondary | ICD-10-CM | POA: Diagnosis not present

## 2013-07-27 DIAGNOSIS — M6580 Other synovitis and tenosynovitis, unspecified site: Secondary | ICD-10-CM | POA: Diagnosis not present

## 2013-07-27 DIAGNOSIS — M25569 Pain in unspecified knee: Secondary | ICD-10-CM | POA: Diagnosis not present

## 2013-08-03 DIAGNOSIS — H52209 Unspecified astigmatism, unspecified eye: Secondary | ICD-10-CM | POA: Diagnosis not present

## 2013-08-03 DIAGNOSIS — H2510 Age-related nuclear cataract, unspecified eye: Secondary | ICD-10-CM | POA: Diagnosis not present

## 2013-08-03 DIAGNOSIS — H25019 Cortical age-related cataract, unspecified eye: Secondary | ICD-10-CM | POA: Diagnosis not present

## 2013-08-03 DIAGNOSIS — H04129 Dry eye syndrome of unspecified lacrimal gland: Secondary | ICD-10-CM | POA: Diagnosis not present

## 2013-08-09 ENCOUNTER — Ambulatory Visit (INDEPENDENT_AMBULATORY_CARE_PROVIDER_SITE_OTHER): Payer: Medicare Other | Admitting: Internal Medicine

## 2013-08-09 ENCOUNTER — Encounter: Payer: Self-pay | Admitting: Internal Medicine

## 2013-08-09 VITALS — BP 142/68 | HR 81 | Temp 98.4°F | Wt 133.6 lb

## 2013-08-09 DIAGNOSIS — Z23 Encounter for immunization: Secondary | ICD-10-CM

## 2013-08-09 DIAGNOSIS — J189 Pneumonia, unspecified organism: Secondary | ICD-10-CM

## 2013-08-09 NOTE — Patient Instructions (Signed)
The pneumonia vaccine is recommended; it as needed only once after age 71. The influenza shot is recommended every fall in October or November.

## 2013-08-09 NOTE — Progress Notes (Signed)
Pre visit review using our clinic review tool, if applicable. No additional management support is needed unless otherwise documented below in the visit note. 

## 2013-08-09 NOTE — Progress Notes (Signed)
   Subjective:    Patient ID: Pamela Blair, female    DOB: 01/19/1941, 73 y.o.   MRN: [id number]  HPI  She has completed full course of antibiotic for the community-acquired pneumonia. She is questioning whether there is any residual issue. She has performed incentive spirometry by blowing up 10 balloons/day.  Chest Xray reviewed personally  & with patient.   Review of Systems She denies fever, chills, sweats, sputum production, hemoptysis, pleurisy, paroxysmal nocturnal dyspnea, or edema.        Objective:   Physical Exam General appearance:good health ;well nourished; no acute distress or increased work of breathing is present.  No  lymphadenopathy about the head, neck, or axilla noted. Appears younger than stated age  Eyes: No conjunctival inflammation or lid edema is present. There is no scleral icterus.  Ears:  External ear exam shows no significant lesions or deformities.  Otoscopic examination reveals clear canals, tympanic membranes are intact bilaterally without bulging, retraction, inflammation or discharge.  Nose:  External nasal examination shows no deformity or inflammation. Nasal mucosa are pink and moist without lesions or exudates. No septal dislocation or deviation.No obstruction to airflow.   Oral exam: Dental hygiene is good; lips and gums are healthy appearing.There is no oropharyngeal erythema or exudate noted.   Neck:  No deformities,  masses, or tenderness noted.   Supple with full range of motion without pain.   Heart:  Normal rate and regular rhythm. S1 and S2 normal without gallop, murmur, click, rub or other extra sounds.   Lungs:Chest clear to auscultation; no wheezes, rhonchi,rales ,or rubs present.No increased work of breathing.    M/S:  No cyanosis, edema, or clubbing  noted .Scoliosis is present of thoracic spine superiorly  Skin: Warm & dry         Assessment & Plan:  #1 S/P CAP; clinically resolved Plan: see orders

## 2013-08-10 ENCOUNTER — Encounter (HOSPITAL_COMMUNITY): Payer: Medicare Other

## 2013-09-29 DIAGNOSIS — M25569 Pain in unspecified knee: Secondary | ICD-10-CM | POA: Diagnosis not present

## 2013-09-29 DIAGNOSIS — M2240 Chondromalacia patellae, unspecified knee: Secondary | ICD-10-CM | POA: Diagnosis not present

## 2013-09-29 DIAGNOSIS — M1710 Unilateral primary osteoarthritis, unspecified knee: Secondary | ICD-10-CM | POA: Diagnosis not present

## 2013-09-29 DIAGNOSIS — M6580 Other synovitis and tenosynovitis, unspecified site: Secondary | ICD-10-CM | POA: Diagnosis not present

## 2013-10-13 ENCOUNTER — Ambulatory Visit: Payer: Medicare Other | Admitting: Internal Medicine

## 2013-10-16 ENCOUNTER — Encounter: Payer: Self-pay | Admitting: Internal Medicine

## 2013-10-16 ENCOUNTER — Ambulatory Visit (INDEPENDENT_AMBULATORY_CARE_PROVIDER_SITE_OTHER): Payer: Medicare Other | Admitting: Internal Medicine

## 2013-10-16 VITALS — BP 140/82 | HR 73 | Temp 99.5°F | Resp 15 | Wt 134.8 lb

## 2013-10-16 DIAGNOSIS — M1711 Unilateral primary osteoarthritis, right knee: Secondary | ICD-10-CM

## 2013-10-16 DIAGNOSIS — M1710 Unilateral primary osteoarthritis, unspecified knee: Secondary | ICD-10-CM | POA: Diagnosis not present

## 2013-10-16 DIAGNOSIS — IMO0002 Reserved for concepts with insufficient information to code with codable children: Secondary | ICD-10-CM | POA: Diagnosis not present

## 2013-10-16 DIAGNOSIS — R739 Hyperglycemia, unspecified: Secondary | ICD-10-CM

## 2013-10-16 DIAGNOSIS — R7309 Other abnormal glucose: Secondary | ICD-10-CM

## 2013-10-16 DIAGNOSIS — E785 Hyperlipidemia, unspecified: Secondary | ICD-10-CM

## 2013-10-16 DIAGNOSIS — R9431 Abnormal electrocardiogram [ECG] [EKG]: Secondary | ICD-10-CM

## 2013-10-16 DIAGNOSIS — E876 Hypokalemia: Secondary | ICD-10-CM | POA: Diagnosis not present

## 2013-10-16 NOTE — Assessment & Plan Note (Signed)
BMET in am

## 2013-10-16 NOTE — Assessment & Plan Note (Signed)
Lipids, LFT

## 2013-10-16 NOTE — Patient Instructions (Signed)
Zicam Melts or Zinc lozenges as per package label for scratchy throat . Complementary options include  vitamin C 2000 mg daily; & Echinacea for 4-7 days. Report persistent or progressive fever; discolored nasal or chest secretions; or frontal headache or facial  pain.      

## 2013-10-16 NOTE — Progress Notes (Signed)
Pre visit review using our clinic review tool, if applicable. No additional management support is needed unless otherwise documented below in the visit note. 

## 2013-10-16 NOTE — Progress Notes (Signed)
Subjective:    Patient ID: Pamela Blair, female    DOB: 09-Mar-1941, 73 y.o.   MRN: [id number]  HPI  A right total knee replacement is scheduled for May 5; she has been seeing Dr. Percell Miller, [facility], [facility] for over a year. She's had at least 4 injections of steroids which have offered only 2-8 weeks of relief. She has pain with ambulation and must use a cane. She has great difficulty navigating stairs.  Significantly she does have a history of hyperglycemia; her last A1c was 5.1% on 11/11/2012.  Her most recent lipids revealed an LDL of 158.8 in May 2014. She states that she is on pravastatin daily.  Her potassium has been low in the past; most recently 3.4.  A heart healthy diet is followed; exercise limited by knee. Advanced cholesterol testing reveals  LDL goal is less than 100 ; ideally < 70 . Low dose ASA taken   Review of Systems Specifically denied are  chest pain, palpitations, dyspnea, or claudication.  Significant abdominal symptoms, memory deficit, or myalgias not present.  She is not having fever, chills, sweats, or purulent secretions.       Objective:   Physical Exam Gen.: Healthy and well-nourished in appearance. Alert, appropriate and cooperative throughout exam. Appears younger than stated age  Head: Normocephalic without obvious abnormalities  Eyes: No corneal or conjunctival inflammation noted. Pupils equal round reactive to light and accommodation. Extraocular motion intact.  Ears: External  ear exam reveals no significant lesions or deformities. Canals clear .TMs normal.  Nose: External nasal exam reveals no deformity or inflammation. Nasal mucosa are pink and moist. No lesions or exudates noted.   Mouth: Oral mucosa and oropharynx reveal no lesions or exudates. Teeth in good repair. Neck: No deformities, masses, or tenderness noted. Thyroid normal. Lungs: Normal respiratory effort; chest expands symmetrically. Lungs are clear to auscultation  without rales, wheezes, or increased work of breathing. Heart: Normal rate and rhythm. Normal S1 and S2. No gallop, click, or rub. S4 w/o murmur. Abdomen: Bowel sounds normal; abdomen soft and nontender. No masses, organomegaly or hernias noted.                                   Musculoskeletal/extremities: No clubbing, cyanosis, edema, or significant extremity  deformity noted. Range of motion normal .Tone & strength normal. Hand joints normal  Fingernail health good. Fusiform R knee with crepitus Able to lie down & sit up w/o help. Negative SLR bilaterally Vascular: Carotid, radial artery, dorsalis pedis and  posterior tibial pulses are full and equal. No bruits present. Neurologic: Alert and oriented x3. Deep tendon reflexes asymmetrical ;0+ @ R knee.  Gait :limping on R knee.  Skin: Intact without suspicious lesions or rashes. Lymph: No cervical, axillary lymphadenopathy present. Psych: Mood and affect are normal. Normally interactive                                                                                        Assessment & Plan:  #1 DJD R knee #2? Early URI, viral. Clinically  not significant #3 See Current Assessment & Plan in Problem List under specific Diagnosis

## 2013-10-17 ENCOUNTER — Other Ambulatory Visit (INDEPENDENT_AMBULATORY_CARE_PROVIDER_SITE_OTHER): Payer: Medicare Other

## 2013-10-17 DIAGNOSIS — E876 Hypokalemia: Secondary | ICD-10-CM | POA: Diagnosis not present

## 2013-10-17 DIAGNOSIS — E785 Hyperlipidemia, unspecified: Secondary | ICD-10-CM

## 2013-10-17 LAB — HEPATIC FUNCTION PANEL
ALBUMIN: 3.6 g/dL (ref 3.5–5.2)
ALT: 18 U/L (ref 0–35)
AST: 16 U/L (ref 0–37)
Alkaline Phosphatase: 38 U/L — ABNORMAL LOW (ref 39–117)
Bilirubin, Direct: 0 mg/dL (ref 0.0–0.3)
TOTAL PROTEIN: 6.5 g/dL (ref 6.0–8.3)
Total Bilirubin: 0.4 mg/dL (ref 0.3–1.2)

## 2013-10-17 LAB — BASIC METABOLIC PANEL
BUN: 14 mg/dL (ref 6–23)
CO2: 28 meq/L (ref 19–32)
CREATININE: 0.6 mg/dL (ref 0.4–1.2)
Calcium: 9 mg/dL (ref 8.4–10.5)
Chloride: 105 mEq/L (ref 96–112)
GFR: 108.92 mL/min (ref >60.00)
Glucose, Bld: 86 mg/dL (ref 70–99)
Potassium: 3.9 mEq/L (ref 3.5–5.1)
Sodium: 139 mEq/L (ref 135–145)

## 2013-10-17 LAB — LIPID PANEL
CHOL/HDL RATIO: 4
Cholesterol: 204 mg/dL — ABNORMAL HIGH (ref 0–200)
HDL: 58.1 mg/dL (ref >39.00)
LDL Cholesterol: 134 mg/dL — ABNORMAL HIGH (ref 0–99)
TRIGLYCERIDES: 60 mg/dL (ref 0.0–149.0)
VLDL: 12 mg/dL (ref 0.0–40.0)

## 2013-10-17 NOTE — Assessment & Plan Note (Signed)
Most recent EKG reviewed; no repeat necessary

## 2013-10-23 DIAGNOSIS — M25569 Pain in unspecified knee: Secondary | ICD-10-CM | POA: Diagnosis not present

## 2013-10-23 DIAGNOSIS — M1710 Unilateral primary osteoarthritis, unspecified knee: Secondary | ICD-10-CM | POA: Diagnosis not present

## 2013-10-30 DIAGNOSIS — E785 Hyperlipidemia, unspecified: Secondary | ICD-10-CM | POA: Diagnosis not present

## 2013-10-30 DIAGNOSIS — Z01818 Encounter for other preprocedural examination: Secondary | ICD-10-CM | POA: Diagnosis not present

## 2013-10-30 DIAGNOSIS — M1710 Unilateral primary osteoarthritis, unspecified knee: Secondary | ICD-10-CM | POA: Diagnosis not present

## 2013-11-07 DIAGNOSIS — G4701 Insomnia due to medical condition: Secondary | ICD-10-CM | POA: Diagnosis not present

## 2013-11-07 DIAGNOSIS — M6281 Muscle weakness (generalized): Secondary | ICD-10-CM | POA: Diagnosis not present

## 2013-11-07 DIAGNOSIS — R05 Cough: Secondary | ICD-10-CM | POA: Diagnosis not present

## 2013-11-07 DIAGNOSIS — R269 Unspecified abnormalities of gait and mobility: Secondary | ICD-10-CM | POA: Diagnosis not present

## 2013-11-07 DIAGNOSIS — G8918 Other acute postprocedural pain: Secondary | ICD-10-CM | POA: Diagnosis not present

## 2013-11-07 DIAGNOSIS — F329 Major depressive disorder, single episode, unspecified: Secondary | ICD-10-CM | POA: Diagnosis not present

## 2013-11-07 DIAGNOSIS — M25569 Pain in unspecified knee: Secondary | ICD-10-CM | POA: Diagnosis not present

## 2013-11-07 DIAGNOSIS — F3289 Other specified depressive episodes: Secondary | ICD-10-CM | POA: Diagnosis not present

## 2013-11-07 DIAGNOSIS — D649 Anemia, unspecified: Secondary | ICD-10-CM | POA: Diagnosis not present

## 2013-11-07 DIAGNOSIS — R059 Cough, unspecified: Secondary | ICD-10-CM | POA: Diagnosis not present

## 2013-11-07 DIAGNOSIS — R52 Pain, unspecified: Secondary | ICD-10-CM | POA: Diagnosis not present

## 2013-11-07 DIAGNOSIS — M130 Polyarthritis, unspecified: Secondary | ICD-10-CM | POA: Diagnosis not present

## 2013-11-07 DIAGNOSIS — Z96659 Presence of unspecified artificial knee joint: Secondary | ICD-10-CM | POA: Diagnosis not present

## 2013-11-07 DIAGNOSIS — M1710 Unilateral primary osteoarthritis, unspecified knee: Secondary | ICD-10-CM | POA: Diagnosis not present

## 2013-11-07 DIAGNOSIS — Z5189 Encounter for other specified aftercare: Secondary | ICD-10-CM | POA: Diagnosis not present

## 2013-11-07 DIAGNOSIS — K5900 Constipation, unspecified: Secondary | ICD-10-CM | POA: Diagnosis not present

## 2013-11-07 DIAGNOSIS — E785 Hyperlipidemia, unspecified: Secondary | ICD-10-CM | POA: Diagnosis present

## 2013-11-07 DIAGNOSIS — K219 Gastro-esophageal reflux disease without esophagitis: Secondary | ICD-10-CM | POA: Diagnosis not present

## 2013-11-07 DIAGNOSIS — IMO0002 Reserved for concepts with insufficient information to code with codable children: Secondary | ICD-10-CM | POA: Diagnosis not present

## 2013-11-11 DIAGNOSIS — R0989 Other specified symptoms and signs involving the circulatory and respiratory systems: Secondary | ICD-10-CM | POA: Diagnosis not present

## 2013-11-11 DIAGNOSIS — R05 Cough: Secondary | ICD-10-CM | POA: Diagnosis not present

## 2013-11-11 DIAGNOSIS — Z6828 Body mass index (BMI) 28.0-28.9, adult: Secondary | ICD-10-CM | POA: Diagnosis not present

## 2013-11-11 DIAGNOSIS — F329 Major depressive disorder, single episode, unspecified: Secondary | ICD-10-CM | POA: Diagnosis not present

## 2013-11-11 DIAGNOSIS — D649 Anemia, unspecified: Secondary | ICD-10-CM | POA: Diagnosis not present

## 2013-11-11 DIAGNOSIS — Z96659 Presence of unspecified artificial knee joint: Secondary | ICD-10-CM | POA: Diagnosis not present

## 2013-11-11 DIAGNOSIS — R609 Edema, unspecified: Secondary | ICD-10-CM | POA: Diagnosis not present

## 2013-11-11 DIAGNOSIS — F3289 Other specified depressive episodes: Secondary | ICD-10-CM | POA: Diagnosis not present

## 2013-11-11 DIAGNOSIS — M810 Age-related osteoporosis without current pathological fracture: Secondary | ICD-10-CM | POA: Diagnosis not present

## 2013-11-11 DIAGNOSIS — M1990 Unspecified osteoarthritis, unspecified site: Secondary | ICD-10-CM | POA: Diagnosis not present

## 2013-11-11 DIAGNOSIS — E782 Mixed hyperlipidemia: Secondary | ICD-10-CM | POA: Diagnosis not present

## 2013-11-11 DIAGNOSIS — K5900 Constipation, unspecified: Secondary | ICD-10-CM | POA: Diagnosis not present

## 2013-11-11 DIAGNOSIS — M130 Polyarthritis, unspecified: Secondary | ICD-10-CM | POA: Diagnosis not present

## 2013-11-11 DIAGNOSIS — Z5189 Encounter for other specified aftercare: Secondary | ICD-10-CM | POA: Diagnosis not present

## 2013-11-11 DIAGNOSIS — R52 Pain, unspecified: Secondary | ICD-10-CM | POA: Diagnosis not present

## 2013-11-11 DIAGNOSIS — IMO0002 Reserved for concepts with insufficient information to code with codable children: Secondary | ICD-10-CM | POA: Diagnosis not present

## 2013-11-11 DIAGNOSIS — R059 Cough, unspecified: Secondary | ICD-10-CM | POA: Diagnosis not present

## 2013-11-11 DIAGNOSIS — G4701 Insomnia due to medical condition: Secondary | ICD-10-CM | POA: Diagnosis not present

## 2013-11-11 DIAGNOSIS — R0609 Other forms of dyspnea: Secondary | ICD-10-CM | POA: Diagnosis not present

## 2013-11-11 DIAGNOSIS — R269 Unspecified abnormalities of gait and mobility: Secondary | ICD-10-CM | POA: Diagnosis not present

## 2013-11-11 DIAGNOSIS — M6281 Muscle weakness (generalized): Secondary | ICD-10-CM | POA: Diagnosis not present

## 2013-11-11 DIAGNOSIS — K219 Gastro-esophageal reflux disease without esophagitis: Secondary | ICD-10-CM | POA: Diagnosis not present

## 2013-11-11 DIAGNOSIS — M1710 Unilateral primary osteoarthritis, unspecified knee: Secondary | ICD-10-CM | POA: Diagnosis not present

## 2013-11-11 DIAGNOSIS — E785 Hyperlipidemia, unspecified: Secondary | ICD-10-CM | POA: Diagnosis not present

## 2013-11-11 DIAGNOSIS — G479 Sleep disorder, unspecified: Secondary | ICD-10-CM | POA: Diagnosis not present

## 2013-11-13 DIAGNOSIS — F3289 Other specified depressive episodes: Secondary | ICD-10-CM | POA: Diagnosis not present

## 2013-11-13 DIAGNOSIS — Z96659 Presence of unspecified artificial knee joint: Secondary | ICD-10-CM | POA: Diagnosis not present

## 2013-11-13 DIAGNOSIS — E782 Mixed hyperlipidemia: Secondary | ICD-10-CM | POA: Diagnosis not present

## 2013-11-13 DIAGNOSIS — G479 Sleep disorder, unspecified: Secondary | ICD-10-CM | POA: Diagnosis not present

## 2013-11-13 DIAGNOSIS — F329 Major depressive disorder, single episode, unspecified: Secondary | ICD-10-CM | POA: Diagnosis not present

## 2013-11-17 DIAGNOSIS — R609 Edema, unspecified: Secondary | ICD-10-CM | POA: Diagnosis not present

## 2013-11-24 DIAGNOSIS — E785 Hyperlipidemia, unspecified: Secondary | ICD-10-CM | POA: Diagnosis not present

## 2013-11-24 DIAGNOSIS — M810 Age-related osteoporosis without current pathological fracture: Secondary | ICD-10-CM | POA: Diagnosis not present

## 2013-11-24 DIAGNOSIS — M1990 Unspecified osteoarthritis, unspecified site: Secondary | ICD-10-CM | POA: Diagnosis not present

## 2013-11-24 DIAGNOSIS — Z6828 Body mass index (BMI) 28.0-28.9, adult: Secondary | ICD-10-CM | POA: Diagnosis not present

## 2013-12-14 DIAGNOSIS — M1710 Unilateral primary osteoarthritis, unspecified knee: Secondary | ICD-10-CM | POA: Diagnosis not present

## 2013-12-14 DIAGNOSIS — M6281 Muscle weakness (generalized): Secondary | ICD-10-CM | POA: Diagnosis not present

## 2013-12-14 DIAGNOSIS — D649 Anemia, unspecified: Secondary | ICD-10-CM | POA: Diagnosis not present

## 2013-12-14 DIAGNOSIS — R269 Unspecified abnormalities of gait and mobility: Secondary | ICD-10-CM | POA: Diagnosis not present

## 2013-12-14 DIAGNOSIS — M159 Polyosteoarthritis, unspecified: Secondary | ICD-10-CM | POA: Diagnosis not present

## 2013-12-19 DIAGNOSIS — M1710 Unilateral primary osteoarthritis, unspecified knee: Secondary | ICD-10-CM | POA: Diagnosis not present

## 2013-12-19 DIAGNOSIS — M159 Polyosteoarthritis, unspecified: Secondary | ICD-10-CM | POA: Diagnosis not present

## 2013-12-19 DIAGNOSIS — D649 Anemia, unspecified: Secondary | ICD-10-CM | POA: Diagnosis not present

## 2013-12-19 DIAGNOSIS — M6281 Muscle weakness (generalized): Secondary | ICD-10-CM | POA: Diagnosis not present

## 2013-12-19 DIAGNOSIS — R269 Unspecified abnormalities of gait and mobility: Secondary | ICD-10-CM | POA: Diagnosis not present

## 2013-12-20 DIAGNOSIS — Z471 Aftercare following joint replacement surgery: Secondary | ICD-10-CM | POA: Diagnosis not present

## 2013-12-20 DIAGNOSIS — Z96659 Presence of unspecified artificial knee joint: Secondary | ICD-10-CM | POA: Diagnosis not present

## 2013-12-21 DIAGNOSIS — M159 Polyosteoarthritis, unspecified: Secondary | ICD-10-CM | POA: Diagnosis not present

## 2013-12-21 DIAGNOSIS — R269 Unspecified abnormalities of gait and mobility: Secondary | ICD-10-CM | POA: Diagnosis not present

## 2013-12-21 DIAGNOSIS — M6281 Muscle weakness (generalized): Secondary | ICD-10-CM | POA: Diagnosis not present

## 2013-12-21 DIAGNOSIS — D649 Anemia, unspecified: Secondary | ICD-10-CM | POA: Diagnosis not present

## 2013-12-21 DIAGNOSIS — M1710 Unilateral primary osteoarthritis, unspecified knee: Secondary | ICD-10-CM | POA: Diagnosis not present

## 2013-12-26 DIAGNOSIS — D649 Anemia, unspecified: Secondary | ICD-10-CM | POA: Diagnosis not present

## 2013-12-26 DIAGNOSIS — M1710 Unilateral primary osteoarthritis, unspecified knee: Secondary | ICD-10-CM | POA: Diagnosis not present

## 2013-12-26 DIAGNOSIS — R269 Unspecified abnormalities of gait and mobility: Secondary | ICD-10-CM | POA: Diagnosis not present

## 2013-12-26 DIAGNOSIS — M159 Polyosteoarthritis, unspecified: Secondary | ICD-10-CM | POA: Diagnosis not present

## 2013-12-26 DIAGNOSIS — M6281 Muscle weakness (generalized): Secondary | ICD-10-CM | POA: Diagnosis not present

## 2013-12-28 DIAGNOSIS — M159 Polyosteoarthritis, unspecified: Secondary | ICD-10-CM | POA: Diagnosis not present

## 2013-12-28 DIAGNOSIS — D649 Anemia, unspecified: Secondary | ICD-10-CM | POA: Diagnosis not present

## 2013-12-28 DIAGNOSIS — R269 Unspecified abnormalities of gait and mobility: Secondary | ICD-10-CM | POA: Diagnosis not present

## 2013-12-28 DIAGNOSIS — M6281 Muscle weakness (generalized): Secondary | ICD-10-CM | POA: Diagnosis not present

## 2013-12-28 DIAGNOSIS — M1710 Unilateral primary osteoarthritis, unspecified knee: Secondary | ICD-10-CM | POA: Diagnosis not present

## 2014-01-02 DIAGNOSIS — M1710 Unilateral primary osteoarthritis, unspecified knee: Secondary | ICD-10-CM | POA: Diagnosis not present

## 2014-01-02 DIAGNOSIS — M6281 Muscle weakness (generalized): Secondary | ICD-10-CM | POA: Diagnosis not present

## 2014-01-02 DIAGNOSIS — M159 Polyosteoarthritis, unspecified: Secondary | ICD-10-CM | POA: Diagnosis not present

## 2014-01-02 DIAGNOSIS — D649 Anemia, unspecified: Secondary | ICD-10-CM | POA: Diagnosis not present

## 2014-01-02 DIAGNOSIS — R269 Unspecified abnormalities of gait and mobility: Secondary | ICD-10-CM | POA: Diagnosis not present

## 2014-01-11 DIAGNOSIS — Z96659 Presence of unspecified artificial knee joint: Secondary | ICD-10-CM | POA: Diagnosis not present

## 2014-01-11 DIAGNOSIS — Z471 Aftercare following joint replacement surgery: Secondary | ICD-10-CM | POA: Diagnosis not present

## 2014-01-16 DIAGNOSIS — M7989 Other specified soft tissue disorders: Secondary | ICD-10-CM | POA: Diagnosis not present

## 2014-02-16 DIAGNOSIS — M2241 Chondromalacia patellae, right knee: Secondary | ICD-10-CM | POA: Insufficient documentation

## 2014-02-16 DIAGNOSIS — S7002XA Contusion of left hip, initial encounter: Secondary | ICD-10-CM | POA: Insufficient documentation

## 2014-02-16 DIAGNOSIS — M1610 Unilateral primary osteoarthritis, unspecified hip: Secondary | ICD-10-CM | POA: Insufficient documentation

## 2014-02-16 DIAGNOSIS — M659 Synovitis and tenosynovitis, unspecified: Secondary | ICD-10-CM | POA: Insufficient documentation

## 2014-02-19 ENCOUNTER — Other Ambulatory Visit: Payer: Self-pay

## 2014-02-19 MED ORDER — PRAVASTATIN SODIUM 20 MG PO TABS: 20.0000 mg | ORAL_TABLET | Freq: Every day | ORAL | Status: DC

## 2014-02-26 ENCOUNTER — Encounter: Payer: Self-pay | Admitting: Internal Medicine

## 2014-02-26 ENCOUNTER — Ambulatory Visit (INDEPENDENT_AMBULATORY_CARE_PROVIDER_SITE_OTHER): Payer: Medicare Other | Admitting: Internal Medicine

## 2014-02-26 VITALS — BP 148/82 | HR 83 | Temp 98.3°F | Resp 13 | Wt 136.0 lb

## 2014-02-26 DIAGNOSIS — R059 Cough, unspecified: Secondary | ICD-10-CM

## 2014-02-26 DIAGNOSIS — R05 Cough: Secondary | ICD-10-CM

## 2014-02-26 DIAGNOSIS — J310 Chronic rhinitis: Secondary | ICD-10-CM | POA: Diagnosis not present

## 2014-02-26 NOTE — Progress Notes (Signed)
   Subjective:    Patient ID: Pamela Blair, female    DOB: 1940/07/14, 73 y.o.   MRN: [id number]  HPI   Symptoms began 02/23/14 as some congestion in the head mainly in the temple areas. She also had postnasal drainage with clear secretions.  Subsequently she developed a cough. Her main concern was her recent history of pneumonia & its possible recurrence.  She had sneezing but not not other extrinsic symptoms    At this time the cough is minor & productive of clear secretions.    Review of Systems Frontal headache, facial pain , nasal purulence, dental pain, sore throat , otic pain or otic discharge denied. No fever , chills or sweats.  She has no itchy, watery eyes.  She denies associated wheezing or shortness of breath.     Objective:   Physical Exam   Other than some wax in the right ear ,her exam is unremarkable.  General appearance:good health ;well nourished; no acute distress or increased work of breathing is present.  No  lymphadenopathy about the head, neck, or axilla noted.   Eyes: No conjunctival inflammation or lid edema is present. There is no scleral icterus.  Ears:  External ear exam shows no significant lesions or deformities.  Otoscopic examination reveals normal L TM without bulging, retraction, inflammation or discharge.  Nose:  External nasal examination shows no deformity or inflammation. Nasal mucosa are pink and moist without lesions or exudates. No septal dislocation or deviation.No obstruction to airflow.   Oral exam: Dental hygiene is good; lips and gums are healthy appearing.There is no oropharyngeal erythema or exudate noted.   Neck:  No deformities, thyromegaly, masses, or tenderness noted.      Heart:  Normal rate and regular rhythm. S1 and S2 normal without gallop, murmur, click, rub or other extra sounds.   Lungs:Chest clear to auscultation; no wheezes, rhonchi,rales ,or rubs present.No increased work of breathing.    Extremities:  No  cyanosis, edema, or clubbing  noted    Skin: Warm & dry w/o jaundice or tenting.        Assessment & Plan:  #1 cough #2 non allergic rrhinitis See AVS

## 2014-02-26 NOTE — Progress Notes (Signed)
Pre visit review using our clinic review tool, if applicable. No additional management support is needed unless otherwise documented below in the visit note. 

## 2014-02-26 NOTE — Patient Instructions (Signed)
Zicam Melts or Zinc lozenges as per package label for any sore throat . Complementary options include  vitamin C 2000 mg daily; & Echinacea for 4-7 days. Report persistent or progressive fever; discolored nasal or chest secretions; or frontal headache or facial  pain.  Plain Mucinex (NOT D) for thick secretions ;force NON dairy fluids .   Nasal cleansing in the shower as discussed with lather of mild shampoo.After 10 seconds wash off lather while  exhaling through nostrils. Make sure that all residual soap is removed to prevent irritation.  Flonase OR Nasacort AQ 1 spray in each nostril twice a day as needed. Use the "crossover" technique into opposite nostril spraying toward opposite ear @ 45 degree angle, not straight up into nostril.  Plain Allegra (NOT D )  160 daily , Loratidine 10 mg , OR Zyrtec 10 mg @ bedtime  as needed for itchy eyes & sneezing.

## 2014-03-01 DIAGNOSIS — M810 Age-related osteoporosis without current pathological fracture: Secondary | ICD-10-CM | POA: Diagnosis not present

## 2014-03-01 DIAGNOSIS — Z6828 Body mass index (BMI) 28.0-28.9, adult: Secondary | ICD-10-CM | POA: Diagnosis not present

## 2014-04-10 DIAGNOSIS — Z471 Aftercare following joint replacement surgery: Secondary | ICD-10-CM | POA: Diagnosis not present

## 2014-04-10 DIAGNOSIS — Z96651 Presence of right artificial knee joint: Secondary | ICD-10-CM | POA: Diagnosis not present

## 2014-06-14 DIAGNOSIS — I6521 Occlusion and stenosis of right carotid artery: Secondary | ICD-10-CM | POA: Diagnosis not present

## 2014-06-14 DIAGNOSIS — M5412 Radiculopathy, cervical region: Secondary | ICD-10-CM | POA: Diagnosis not present

## 2014-06-14 DIAGNOSIS — Z981 Arthrodesis status: Secondary | ICD-10-CM | POA: Diagnosis not present

## 2014-06-14 DIAGNOSIS — M5032 Other cervical disc degeneration, mid-cervical region: Secondary | ICD-10-CM | POA: Diagnosis not present

## 2014-06-21 ENCOUNTER — Encounter: Payer: Self-pay | Admitting: Family

## 2014-06-21 ENCOUNTER — Ambulatory Visit (INDEPENDENT_AMBULATORY_CARE_PROVIDER_SITE_OTHER): Payer: Medicare Other | Admitting: Family

## 2014-06-21 VITALS — BP 132/78 | HR 74 | Temp 98.3°F | Resp 18 | Ht 59.0 in | Wt 135.0 lb

## 2014-06-21 DIAGNOSIS — J209 Acute bronchitis, unspecified: Secondary | ICD-10-CM | POA: Diagnosis not present

## 2014-06-21 MED ORDER — DOXYCYCLINE HYCLATE 100 MG PO TABS: ORAL_TABLET | ORAL | Status: DC

## 2014-06-21 NOTE — Progress Notes (Signed)
Pre visit review using our clinic review tool, if applicable. No additional management support is needed unless otherwise documented below in the visit note. 

## 2014-06-21 NOTE — Assessment & Plan Note (Signed)
Symptoms and exam consistent with bronchitis. Start doxycycline. Continue over-the-counter medications as needed for symptoms relief and supportive care. Follow up if symptoms worsen or fail to improve.

## 2014-06-21 NOTE — Progress Notes (Signed)
   Subjective:    Patient ID: Pamela Blair, female    DOB: 1940-10-20, 73 y.o.   MRN: [id number]  Chief Complaint  Patient presents with  . Cough    cough, congestion, pain in chest to back when coughing, scared that she has pneumonia x3 days   HPI:  Pamela Blair is a 73 y.o. female who presents today for an acute visit.   Acute symptoms of cough, congestion, and cough that causes a chest pain in her back has been going on for about 3 days. She started with a sore throat and the next day became unbearable. Indicates that she feels a little better. Has tried ginger and Alka-Seltzer which helped a little. Yesterday she indicates a temperature of 102, but has not had a fever today. Denies any shortness of breath.   Allergies  Allergen Reactions  . Niacin     rash  . Clarithromycin     ? reaction   Current Outpatient Prescriptions on File Prior to Visit  Medication Sig Dispense Refill  . buPROPion (WELLBUTRIN SR) 150 MG 12 hr tablet Take 150 mg by mouth 2 (two) times daily.    Marland Kitchen ibuprofen (ADVIL,MOTRIN) 800 MG tablet Take 1 tablet by mouth as needed.    . pravastatin (PRAVACHOL) 20 MG tablet Take 1 tablet (20 mg total) by mouth daily. 90 tablet 1  . VOLTAREN 1 % GEL Apply 1 application topically as needed.     No current facility-administered medications on file prior to visit.    Review of Systems    See HPI  Objective:    BP 132/78 mmHg  Pulse 74  Temp(Src) 98.3 F (36.8 C) (Oral)  Resp 18  Ht 4\' 11"  (1.499 m)  Wt 135 lb (61.236 kg)  BMI 27.25 kg/m2  SpO2 97% Nursing note and vital signs reviewed.  Physical Exam  Constitutional: She is oriented to person, place, and time. She appears well-developed and well-nourished. No distress.  HENT:  Right Ear: Hearing, tympanic membrane, external ear and ear canal normal.  Left Ear: Hearing, tympanic membrane, external ear and ear canal normal.  Nose: Right sinus exhibits no maxillary sinus tenderness and no frontal sinus  tenderness. Left sinus exhibits no maxillary sinus tenderness and no frontal sinus tenderness.  Mouth/Throat: Uvula is midline and mucous membranes are normal. Posterior oropharyngeal erythema present.  Cardiovascular: Normal rate, regular rhythm, normal heart sounds and intact distal pulses.   Pulmonary/Chest: Effort normal and breath sounds normal.  Lymphadenopathy:    She has no cervical adenopathy.  Neurological: She is alert and oriented to person, place, and time.  Skin: Skin is warm and dry.  Psychiatric: She has a normal mood and affect. Her behavior is normal. Judgment and thought content normal.       Assessment & Plan:

## 2014-06-21 NOTE — Patient Instructions (Signed)
Thank you for choosing [facility].  Summary/Instructions:  Your prescription(s) have been submitted to your pharmacy. Please take as directed and contact our office if you believe you are having problem(s) with the medication(s).  If your symptoms worsen or fail to improve, please contact our office for further instruction, or in case of emergency go directly to the emergency room at the closest medical facility.    Acute Bronchitis Bronchitis is inflammation of the airways that extend from the windpipe into the lungs (bronchi). The inflammation often causes mucus to develop. This leads to a cough, which is the most common symptom of bronchitis.  In acute bronchitis, the condition usually develops suddenly and goes away over time, usually in a couple weeks. Smoking, allergies, and asthma can make bronchitis worse. Repeated episodes of bronchitis may cause further lung problems.  CAUSES Acute bronchitis is most often caused by the same virus that causes a cold. The virus can spread from person to person (contagious) through coughing, sneezing, and touching contaminated objects. SIGNS AND SYMPTOMS   Cough.   Fever.   Coughing up mucus.   Body aches.   Chest congestion.   Chills.   Shortness of breath.   Sore throat.  DIAGNOSIS  Acute bronchitis is usually diagnosed through a physical exam. Your health care provider will also ask you questions about your medical history. Tests, such as chest X-rays, are sometimes done to rule out other conditions.  TREATMENT  Acute bronchitis usually goes away in a couple weeks. Oftentimes, no medical treatment is necessary. Medicines are sometimes given for relief of fever or cough. Antibiotic medicines are usually not needed but may be prescribed in certain situations. In some cases, an inhaler may be recommended to help reduce shortness of breath and control the cough. A cool mist vaporizer may also be used to help thin bronchial  secretions and make it easier to clear the chest.  HOME CARE INSTRUCTIONS  Get plenty of rest.   Drink enough fluids to keep your urine clear or pale yellow (unless you have a medical condition that requires fluid restriction). Increasing fluids may help thin your respiratory secretions (sputum) and reduce chest congestion, and it will prevent dehydration.   Take medicines only as directed by your health care provider.  If you were prescribed an antibiotic medicine, finish it all even if you start to feel better.  Avoid smoking and secondhand smoke. Exposure to cigarette smoke or irritating chemicals will make bronchitis worse. If you are a smoker, consider using nicotine gum or skin patches to help control withdrawal symptoms. Quitting smoking will help your lungs heal faster.   Reduce the chances of another bout of acute bronchitis by washing your hands frequently, avoiding people with cold symptoms, and trying not to touch your hands to your mouth, nose, or eyes.   Keep all follow-up visits as directed by your health care provider.  SEEK MEDICAL CARE IF: Your symptoms do not improve after 1 week of treatment.  SEEK IMMEDIATE MEDICAL CARE IF:  You develop an increased fever or chills.   You have chest pain.   You have severe shortness of breath.  You have bloody sputum.   You develop dehydration.  You faint or repeatedly feel like you are going to pass out.  You develop repeated vomiting.  You develop a severe headache. MAKE SURE YOU:   Understand these instructions.  Will watch your condition.  Will get help right away if you are not doing well or   get worse. Document Released: 07/30/2004 Document Revised: 11/06/2013 Document Reviewed: 12/13/2012 Exit[employer] Patient Information 2015 [employer]. This information is not intended to replace advice given to you by your health care provider. Make sure you discuss any questions you have with your health care  provider.  

## 2014-07-03 ENCOUNTER — Encounter: Payer: Self-pay | Admitting: Internal Medicine

## 2014-07-03 ENCOUNTER — Ambulatory Visit (INDEPENDENT_AMBULATORY_CARE_PROVIDER_SITE_OTHER): Payer: Medicare Other | Admitting: Internal Medicine

## 2014-07-03 VITALS — BP 148/86 | HR 74 | Temp 98.2°F | Ht 60.0 in | Wt 137.2 lb

## 2014-07-03 DIAGNOSIS — J309 Allergic rhinitis, unspecified: Secondary | ICD-10-CM | POA: Diagnosis not present

## 2014-07-03 MED ORDER — AMOXICILLIN 500 MG PO CAPS: 500.0000 mg | ORAL_CAPSULE | Freq: Three times a day (TID) | ORAL | Status: DC

## 2014-07-03 NOTE — Progress Notes (Signed)
Pre visit review using our clinic review tool, if applicable. No additional management support is needed unless otherwise documented below in the visit note. 

## 2014-07-03 NOTE — Progress Notes (Signed)
   Subjective:    Patient ID: Pamela Blair, female    DOB: [birth year], 73 y.o.   MRN: [id number]  HPI  She states she is no better after taking the course of doxycycline.  Her major issues at this time include head congestion & pressure in the left ear . She has some: Itchy eyes and sneezing.  In the morning she will clear her throat with some scant yellow material  She has no other signs of upper respiratory tract infection of significance.  Review of Systems She denies persistent frontal sinus or maxillary sinus pain.  There is no nasal purulence.  She has no dental pain, otic discharge, wheezing, shortness of breath. There is no associated fever, chills, sweats     Objective:   Physical Exam Her ENT exam is unremarkable except for some cerumen in the right otic canal. There is marked asymmetry of the paraspinous thoracic musculature indicating occult scoliosis.   General appearance:good health ;well nourished; no acute distress or increased work of breathing is present.  No  lymphadenopathy about the head, neck, or axilla noted.  Eyes: No conjunctival inflammation or lid edema is present. There is no scleral icterus. Ears:  External ear exam shows no significant lesions or deformities.  L TM essentially WNL. Nose:  External nasal examination shows no deformity or inflammation. Nasal mucosa are pink and moist without lesions or exudates. No septal dislocation or deviation.No obstruction to airflow.  Oral exam: Dental hygiene is good; lips and gums are healthy appearing.There is no oropharyngeal erythema or exudate noted.  Neck:  No deformities, thyromegaly, masses, or tenderness noted.   Supple with full range of motion without pain.  Heart:  Normal rate and regular rhythm. S1 and S2 normal without gallop, murmur, click, rub or other extra sounds.  Lungs:Chest clear to auscultation; no wheezes, rhonchi,rales ,or rubs present.No increased work of breathing.   Extremities:  No  cyanosis, edema, or clubbing  noted  Skin: Warm & dry w/o jaundice or tenting.        Assessment & Plan:  #1 allergic rhinitis See orders & AVS

## 2014-07-03 NOTE — Patient Instructions (Addendum)
Plain Mucinex (NOT D) for thick secretions ;force NON dairy fluids .   Nasal cleansing in the shower as discussed with lather of mild shampoo.After 10 seconds wash off lather while  exhaling through nostrils. Make sure that all residual soap is removed to prevent irritation.  Flonase OR Nasacort AQ 1 spray in each nostril twice a day as needed. Use the "crossover" technique into opposite nostril spraying toward opposite ear @ 45 degree angle, not straight up into nostril.  Use a Neti pot daily only  as needed for significant sinus congestion; going from open side to congested side . Plain Allegra (NOT D )  160 daily , Loratidine 10 mg , OR Zyrtec 10 mg @ bedtime  as needed for itchy eyes & sneezing.  Fill the  prescription for antibiotic it there is not dramatic improvement in the next 72 hours.Your history & exam do not suggest bacterial infection @ this time.The aggressive nasal hygiene program should resolve your symptoms.

## 2014-08-14 ENCOUNTER — Ambulatory Visit: Payer: Medicare Other

## 2014-08-28 DIAGNOSIS — Z79899 Other long term (current) drug therapy: Secondary | ICD-10-CM | POA: Diagnosis not present

## 2014-08-28 DIAGNOSIS — M810 Age-related osteoporosis without current pathological fracture: Secondary | ICD-10-CM | POA: Diagnosis not present

## 2014-08-28 DIAGNOSIS — Z6828 Body mass index (BMI) 28.0-28.9, adult: Secondary | ICD-10-CM | POA: Diagnosis not present

## 2014-09-03 ENCOUNTER — Other Ambulatory Visit: Payer: Self-pay

## 2014-09-03 MED ORDER — PRAVASTATIN SODIUM 20 MG PO TABS: 20.0000 mg | ORAL_TABLET | Freq: Every day | ORAL | Status: DC

## 2014-09-14 DIAGNOSIS — M2240 Chondromalacia patellae, unspecified knee: Secondary | ICD-10-CM | POA: Diagnosis not present

## 2014-09-14 DIAGNOSIS — M1710 Unilateral primary osteoarthritis, unspecified knee: Secondary | ICD-10-CM | POA: Diagnosis not present

## 2014-09-17 ENCOUNTER — Telehealth: Payer: Self-pay

## 2014-09-17 NOTE — Telephone Encounter (Signed)
LVM for patient to call the practice for confirmation of flu shot or can call the office for an apt to receive a flu vaccine before 10/03/2013.

## 2014-09-24 ENCOUNTER — Ambulatory Visit (HOSPITAL_COMMUNITY)
Admission: RE | Admit: 2014-09-24 | Discharge: 2014-09-24 | Disposition: A | Discharge status: Home / Self Care | Payer: Medicare Other | Source: Ambulatory Visit | Attending: Internal Medicine | Admitting: Internal Medicine

## 2014-10-17 ENCOUNTER — Encounter (HOSPITAL_COMMUNITY): Payer: Self-pay

## 2014-10-17 ENCOUNTER — Ambulatory Visit (HOSPITAL_COMMUNITY)
Admission: RE | Admit: 2014-10-17 | Discharge: 2014-10-17 | Disposition: A | Discharge status: Home / Self Care | Payer: Medicare Other | Source: Ambulatory Visit | Attending: Internal Medicine | Admitting: Internal Medicine

## 2014-10-17 ENCOUNTER — Other Ambulatory Visit (HOSPITAL_COMMUNITY): Payer: Self-pay | Admitting: Internal Medicine

## 2014-10-17 DIAGNOSIS — M180 Bilateral primary osteoarthritis of first carpometacarpal joints: Secondary | ICD-10-CM | POA: Insufficient documentation

## 2014-10-17 DIAGNOSIS — M810 Age-related osteoporosis without current pathological fracture: Secondary | ICD-10-CM | POA: Diagnosis present

## 2014-10-17 MED ORDER — DENOSUMAB 60 MG/ML ~~LOC~~ SOLN
60.0000 mg | Freq: Once | SUBCUTANEOUS | Status: AC
  Administered 2014-10-17: 60 mg via SUBCUTANEOUS
  Filled 2014-10-17: qty 1

## 2014-10-17 NOTE — Discharge Instructions (Signed)
PROLIA °Denosumab injection °What is this medicine? °DENOSUMAB (den oh sue mab) slows bone breakdown. Prolia is used to treat osteoporosis in women after menopause and in men. Xgeva is used to prevent bone fractures and other bone problems caused by cancer bone metastases. Xgeva is also used to treat giant cell tumor of the bone. °This medicine may be used for other purposes; ask your health care provider or pharmacist if you have questions. °COMMON BRAND NAME(S): Prolia, XGEVA °What should I tell my health care provider before I take this medicine? °They need to know if you have any of these conditions: °-dental disease °-eczema °-infection or history of infections °-kidney disease or on dialysis °-low blood calcium or vitamin D °-malabsorption syndrome °-scheduled to have surgery or tooth extraction °-taking medicine that contains denosumab °-thyroid or parathyroid disease °-an unusual reaction to denosumab, other medicines, foods, dyes, or preservatives °-pregnant or trying to get pregnant °-breast-feeding °How should I use this medicine? °This medicine is for injection under the skin. It is given by a health care professional in a hospital or clinic setting. °If you are getting Prolia, a special MedGuide will be given to you by the pharmacist with each prescription and refill. Be sure to read this information carefully each time. °For Prolia, talk to your pediatrician regarding the use of this medicine in children. Special care may be needed. For Xgeva, talk to your pediatrician regarding the use of this medicine in children. While this drug may be prescribed for children as young as 13 years for selected conditions, precautions do apply. °Overdosage: If you think you've taken too much of this medicine contact a poison control center or emergency room at once. °Overdosage: If you think you have taken too much of this medicine contact a poison control center or emergency room at once. °NOTE: This medicine is only  for you. Do not share this medicine with others. °What if I miss a dose? °It is important not to miss your dose. Call your doctor or health care professional if you are unable to keep an appointment. °What may interact with this medicine? °Do not take this medicine with any of the following medications: °-other medicines containing denosumab °This medicine may also interact with the following medications: °-medicines that suppress the immune system °-medicines that treat cancer °-steroid medicines like prednisone or cortisone °This list may not describe all possible interactions. Give your health care provider a list of all the medicines, herbs, non-prescription drugs, or dietary supplements you use. Also tell them if you smoke, drink alcohol, or use illegal drugs. Some items may interact with your medicine. °What should I watch for while using this medicine? °Visit your doctor or health care professional for regular checks on your progress. Your doctor or health care professional may order blood tests and other tests to see how you are doing. °Call your doctor or health care professional if you get a cold or other infection while receiving this medicine. Do not treat yourself. This medicine may decrease your body's ability to fight infection. °You should make sure you get enough calcium and vitamin D while you are taking this medicine, unless your doctor tells you not to. Discuss the foods you eat and the vitamins you take with your health care professional. °See your dentist regularly. Brush and floss your teeth as directed. Before you have any dental work done, tell your dentist you are receiving this medicine. °Do not become pregnant while taking this medicine or for 5 months after   stopping it. Women should inform their doctor if they wish to become pregnant or think they might be pregnant. There is a potential for serious side effects to an unborn child. Talk to your health care professional or pharmacist for  more information. °What side effects may I notice from receiving this medicine? °Side effects that you should report to your doctor or health care professional as soon as possible: °-allergic reactions like skin rash, itching or hives, swelling of the face, lips, or tongue °-breathing problems °-chest pain °-fast, irregular heartbeat °-feeling faint or lightheaded, falls °-fever, chills, or any other sign of infection °-muscle spasms, tightening, or twitches °-numbness or tingling °-skin blisters or bumps, or is dry, peels, or red °-slow healing or unexplained pain in the mouth or jaw °-unusual bleeding or bruising °Side effects that usually do not require medical attention (Report these to your doctor or health care professional if they continue or are bothersome.): °-muscle pain °-stomach upset, gas °This list may not describe all possible side effects. Call your doctor for medical advice about side effects. You may report side effects to FDA at 1-800-FDA-1088. °Where should I keep my medicine? °This medicine is only given in a clinic, doctor's office, or other health care setting and will not be stored at home. °NOTE: This sheet is a summary. It may not cover all possible information. If you have questions about this medicine, talk to your doctor, pharmacist, or health care provider. °© 2015, [employer]. (2011-12-21 12:37:47) °Osteoporosis °Throughout your life, your body breaks down old bone and replaces it with new bone. As you get older, your body does not replace bone as quickly as it breaks it down. By the age of 30 years, most people begin to gradually lose bone because of the imbalance between bone loss and replacement. Some people lose more bone than others. Bone loss beyond a specified normal degree is considered osteoporosis.  °Osteoporosis affects the strength and durability of your bones. The inside of the ends of your bones and your flat bones, like the bones of your pelvis, look like  honeycomb, filled with tiny open spaces. As bone loss occurs, your bones become less dense. This means that the open spaces inside your bones become bigger and the walls between these spaces become thinner. This makes your bones weaker. Bones of a person with osteoporosis can become so weak that they can break (fracture) during minor accidents, such as a simple fall. °CAUSES  °The following factors have been associated with the development of osteoporosis: °· Smoking. °· Drinking more than 2 alcoholic drinks several days per week. °· Long-term use of certain medicines: °¨ Corticosteroids. °¨ Chemotherapy medicines. °¨ Thyroid medicines. °¨ Antiepileptic medicines. °¨ Gonadal hormone suppression medicine. °¨ Immunosuppression medicine. °· Being underweight. °· Lack of physical activity. °· Lack of exposure to the sun. This can lead to vitamin D deficiency. °· Certain medical conditions: °¨ Certain inflammatory bowel diseases, such as Crohn disease and ulcerative colitis. °¨ Diabetes. °¨ Hyperthyroidism. °¨ Hyperparathyroidism. °RISK FACTORS °Anyone can develop osteoporosis. However, the following factors can increase your risk of developing osteoporosis: °· Gender--Women are at higher risk than men. °· Age--Being older than 50 years increases your risk. °· Ethnicity--White and Asian people have an increased risk. °· Weight --Being extremely underweight can increase your risk of osteoporosis. °· Family history of osteoporosis--Having a family member who has developed osteoporosis can increase your risk. °SYMPTOMS  °Usually, people with osteoporosis have no symptoms.  °DIAGNOSIS  °Signs during   a physical exam that may prompt your caregiver to suspect osteoporosis include: °· Decreased height. This is usually caused by the compression of the bones that form your spine (vertebrae) because they have weakened and become fractured. °· A curving or rounding of the upper back (kyphosis). °To confirm signs of osteoporosis,  your caregiver may request a procedure that uses 2 low-dose X-ray beams with different levels of energy to measure your bone mineral density (dual-energy X-ray absorptiometry [DXA]). Also, your caregiver may check your level of vitamin D. °TREATMENT  °The goal of osteoporosis treatment is to strengthen bones in order to decrease the risk of bone fractures. There are different types of medicines available to help achieve this goal. Some of these medicines work by slowing the processes of bone loss. Some medicines work by increasing bone density. Treatment also involves making sure that your levels of calcium and vitamin D are adequate. °PREVENTION  °There are things you can do to help prevent osteoporosis. Adequate intake of calcium and vitamin D can help you achieve optimal bone mineral density. Regular exercise can also help, especially resistance and weight-bearing activities. If you smoke, quitting smoking is an important part of osteoporosis prevention. °MAKE SURE YOU: °· Understand these instructions. °· Will watch your condition. °· Will get help right away if you are not doing well or get worse. °FOR MORE INFORMATION °www.osteo.org and www.nof.org °Document Released: 04/01/2005 Document Revised: 10/17/2012 Document Reviewed: 06/06/2011 °ExitCare® Patient Information ©2015 [employer]. This information is not intended to replace advice given to you by your health care provider. Make sure you discuss any questions you have with your health care provider. ° ° °

## 2014-10-17 NOTE — Progress Notes (Signed)
Uneventful injection of Prolia . Pt last had it at Dr Perini's office 8/15 and states she had no problems. Next appt in 6 months is 04/17/15. Pt discharged ambulatory accompanied by staff to meet husband at back door of short stay

## 2014-11-29 DIAGNOSIS — M810 Age-related osteoporosis without current pathological fracture: Secondary | ICD-10-CM | POA: Diagnosis not present

## 2014-11-29 DIAGNOSIS — E785 Hyperlipidemia, unspecified: Secondary | ICD-10-CM | POA: Diagnosis not present

## 2014-12-04 DIAGNOSIS — H6123 Impacted cerumen, bilateral: Secondary | ICD-10-CM | POA: Diagnosis not present

## 2014-12-04 DIAGNOSIS — R05 Cough: Secondary | ICD-10-CM | POA: Diagnosis not present

## 2014-12-04 DIAGNOSIS — Z1212 Encounter for screening for malignant neoplasm of rectum: Secondary | ICD-10-CM | POA: Diagnosis not present

## 2014-12-04 DIAGNOSIS — M810 Age-related osteoporosis without current pathological fracture: Secondary | ICD-10-CM | POA: Diagnosis not present

## 2014-12-04 DIAGNOSIS — E785 Hyperlipidemia, unspecified: Secondary | ICD-10-CM | POA: Diagnosis not present

## 2014-12-04 DIAGNOSIS — Z6828 Body mass index (BMI) 28.0-28.9, adult: Secondary | ICD-10-CM | POA: Diagnosis not present

## 2014-12-04 DIAGNOSIS — Z1389 Encounter for screening for other disorder: Secondary | ICD-10-CM | POA: Diagnosis not present

## 2014-12-04 DIAGNOSIS — M1990 Unspecified osteoarthritis, unspecified site: Secondary | ICD-10-CM | POA: Diagnosis not present

## 2014-12-04 DIAGNOSIS — R079 Chest pain, unspecified: Secondary | ICD-10-CM | POA: Diagnosis not present

## 2014-12-04 DIAGNOSIS — Z0000 Encounter for general adult medical examination without abnormal findings: Secondary | ICD-10-CM | POA: Diagnosis not present

## 2014-12-07 ENCOUNTER — Telehealth: Payer: Self-pay | Admitting: Internal Medicine

## 2014-12-07 NOTE — Telephone Encounter (Signed)
Received records from [facility] for appointment on 01/29/15 with Dr Debara Pickett.  Records given to [facility] (medical records) for Dr Lysbeth Penner schedule on 01/29/15.  lp

## 2014-12-14 DIAGNOSIS — Z471 Aftercare following joint replacement surgery: Secondary | ICD-10-CM | POA: Diagnosis not present

## 2014-12-14 DIAGNOSIS — Z96651 Presence of right artificial knee joint: Secondary | ICD-10-CM | POA: Diagnosis not present

## 2014-12-28 ENCOUNTER — Ambulatory Visit (INDEPENDENT_AMBULATORY_CARE_PROVIDER_SITE_OTHER)
Admission: RE | Admit: 2014-12-28 | Discharge: 2014-12-28 | Disposition: A | Discharge status: Home / Self Care | Payer: Medicare Other | Source: Ambulatory Visit | Attending: Internal Medicine | Admitting: Internal Medicine

## 2014-12-28 ENCOUNTER — Encounter: Payer: Self-pay | Admitting: Internal Medicine

## 2014-12-28 ENCOUNTER — Ambulatory Visit (INDEPENDENT_AMBULATORY_CARE_PROVIDER_SITE_OTHER): Payer: Medicare Other | Admitting: Internal Medicine

## 2014-12-28 VITALS — BP 118/74 | HR 67 | Temp 98.3°F | Ht 60.0 in | Wt 136.5 lb

## 2014-12-28 DIAGNOSIS — M810 Age-related osteoporosis without current pathological fracture: Secondary | ICD-10-CM

## 2014-12-28 DIAGNOSIS — R0789 Other chest pain: Secondary | ICD-10-CM | POA: Diagnosis not present

## 2014-12-28 DIAGNOSIS — J984 Other disorders of lung: Secondary | ICD-10-CM | POA: Diagnosis not present

## 2014-12-28 MED ORDER — TRAMADOL HCL 50 MG PO TABS: 50.0000 mg | ORAL_TABLET | Freq: Three times a day (TID) | ORAL | Status: DC | PRN

## 2014-12-28 NOTE — Progress Notes (Signed)
   Subjective:    Patient ID: Pamela Blair, female    DOB: 1940/12/25, 74 y.o.   MRN: [id number]  HPI On 12/25/14 she was leaning across the top of a chair when she felt pain in the left inferior thoracic area below her left breast laterally. She did not hear or feel a pop but she's had constant pain since. She's also had pain which is associated now in the left infrascapular area as of 6/22. Rotation of the thorax or deep breathing exacerbates the pain.  Other than slight dyspnea; she has no cardiopulmonary, GI, or constitutional symptoms.  She does have a history of osteoporosis and has been on Actonel since 2011 from her gynecologist.   Review of Systems She denies fever, chills, or sweats. She's had no cough or sputum production. There's been no hemoptysis. She denies paroxysmal nocturnal dyspnea. There is no anterior chest pain, palpitations, or peripheral edema. Unexplained weight loss, abdominal pain, significant dyspepsia, dysphagia, melena, rectal bleeding, or persistently small caliber stools are denied.     Objective:   Physical Exam  She has faint arcus senilis. When the left inferior thoracic area below the breast was palpated she cried out in pain. She also had pain as she started lie down on the exam table. Once she was flat and distracted in conversation; the same amount of pressure surprisingly did not elicit the pain. Deep tendon reflexes are 0+ at the knees  Pertinent or positive findings include: General appearance :adequately nourished; in no distress.  Eyes: No conjunctival inflammation or scleral icterus is present.  Heart:  Normal rate and regular rhythm. S1 and S2 normal without gallop, murmur, click, rub or other extra sounds    Lungs:Chest clear to auscultation; no wheezes, rhonchi,rales ,or rubs present.No increased work of breathing.   Abdomen: bowel sounds normal, soft and non-tender without masses, organomegaly or hernias noted.  No guarding or rebound. No  flank tenderness to percussion.  Vascular : all pulses equal ; no bruits present.  Skin:Warm & dry.  Intact without suspicious lesions or rashes ; no tenting or jaundice   Lymphatic: No lymphadenopathy is noted about the head, neck, axilla.   Neuro: Strength, tone  normal.         Assessment & Plan:  #1 chest wall pain due to dislocation of the left floating rib. Fracture is doubted.  #2 osteoporosis  Plan: See orders and recommendations

## 2014-12-28 NOTE — Patient Instructions (Signed)
Use an anti-inflammatory cream such as Aspercreme or Zostrix cream twice a day to the affected area as needed. In lieu of this warm moist compresses or  hot water bottle can be used. Do not apply ice . 

## 2014-12-28 NOTE — Progress Notes (Signed)
Pre visit review using our clinic review tool, if applicable. No additional management support is needed unless otherwise documented below in the visit note. 

## 2015-01-02 DIAGNOSIS — G5601 Carpal tunnel syndrome, right upper limb: Secondary | ICD-10-CM | POA: Diagnosis not present

## 2015-01-02 DIAGNOSIS — M79641 Pain in right hand: Secondary | ICD-10-CM | POA: Diagnosis not present

## 2015-01-03 ENCOUNTER — Telehealth: Payer: Self-pay | Admitting: Internal Medicine

## 2015-01-03 NOTE — Telephone Encounter (Signed)
Patient stated that she is still in pain and would like to know the results of her X Ray before the weekend, thank you

## 2015-01-03 NOTE — Telephone Encounter (Signed)
Spoke with pt and informed her of the xray results and also let her know that the results were sent out in the mail on 12/31/14

## 2015-01-14 ENCOUNTER — Encounter: Payer: Self-pay | Admitting: Internal Medicine

## 2015-01-14 ENCOUNTER — Other Ambulatory Visit: Payer: Self-pay | Admitting: Internal Medicine

## 2015-01-14 ENCOUNTER — Other Ambulatory Visit: Payer: Medicare Other

## 2015-01-14 ENCOUNTER — Ambulatory Visit (INDEPENDENT_AMBULATORY_CARE_PROVIDER_SITE_OTHER): Payer: Medicare Other | Admitting: Internal Medicine

## 2015-01-14 ENCOUNTER — Ambulatory Visit (INDEPENDENT_AMBULATORY_CARE_PROVIDER_SITE_OTHER)
Admission: RE | Admit: 2015-01-14 | Discharge: 2015-01-14 | Disposition: A | Discharge status: Home / Self Care | Payer: Medicare Other | Source: Ambulatory Visit | Attending: Internal Medicine | Admitting: Internal Medicine

## 2015-01-14 VITALS — BP 110/70 | HR 81 | Temp 97.9°F | Resp 20 | Wt 135.0 lb

## 2015-01-14 DIAGNOSIS — M546 Pain in thoracic spine: Secondary | ICD-10-CM

## 2015-01-14 DIAGNOSIS — R0789 Other chest pain: Secondary | ICD-10-CM

## 2015-01-14 DIAGNOSIS — R079 Chest pain, unspecified: Secondary | ICD-10-CM | POA: Diagnosis not present

## 2015-01-14 DIAGNOSIS — R0781 Pleurodynia: Secondary | ICD-10-CM

## 2015-01-14 NOTE — Progress Notes (Signed)
Pre visit review using our clinic review tool, if applicable. No additional management support is needed unless otherwise documented below in the visit note. 

## 2015-01-14 NOTE — Progress Notes (Signed)
   Subjective:    Patient ID: Pamela Blair, female    DOB: 1940/07/10, 74 y.o.   MRN: [id number]  HPI  Since her 12/28/14 office visit she describes constant level IX pain in the left posterior thoracic area which is aggravated by deep breathing.Initially in June she had experienced left ANTERIOR inferior thoracic pain after leaning over the back of a chair.She is not having anterior chest pain now.  She did not fill tramadol but was taking medication from her last previous surgery.  She states she is able to sleep through the night without sleep disruption from pain but has constant daytime pain..  She describes shortness of breath with the pain but not definite exertional dyspnea. She denies any other definite cardiopulmonary symptoms.  She's had cervical spine surgery as well as knee surgery.   Review of Systems   She specifically denies hemoptysis or purulent sputum. No associated fever, chills or sweats. Anterior chest pain, palpitations, tachycardia, exertional dyspnea, paroxysmal nocturnal dyspnea, claudication or edema are absent. Unexplained weight loss, abdominal pain, significant dyspepsia, dysphagia, melena, rectal bleeding, or persistently small caliber stools are not described.       Objective:   Physical Exam  Pertinent or positive findings include:  She has some difficulty describing the pain and its exacerbating factors. She has pain to palpation over the mid-upper thoracic spine as well as the left posterior thorax in the scapular area. Homans sign is equivocal in the right lower extremity.  General appearance :adequately nourished; in no distress.Appears younger than stated age  Eyes: No conjunctival inflammation or scleral icterus is present.  Oral exam:  Lips and gums are healthy appearing.There is no oropharyngeal erythema or exudate noted. Dental hygiene is good.  Heart:  Normal rate and regular rhythm. S1 and S2 normal without gallop, murmur, click, rub or  other extra sounds    Lungs:Chest clear to auscultation; no wheezes, rhonchi,rales ,or rubs present.No increased work of breathing. Intermittently she exhibits splinting respirations with thoracic movement.  Abdomen: bowel sounds normal, soft and non-tender without masses, organomegaly or hernias noted.  No guarding or rebound.   Vascular : all pulses equal ; no bruits present.  Skin:Warm & dry.  Intact without suspicious lesions or rashes ; no tenting or jaundice   Lymphatic: No lymphadenopathy is noted about the head, neck, axilla   Neuro: Strength, tone & DTRs normal.         Assessment & Plan:  #1 left posterior thoracic chest pain with pleuritic component only during the day.Clinically PTE unlikely  #2 Discomfort over the mid thoracic vertebrae; rule out vertebral fracture.  #3 dyspnea with acute onset of the pain related to deep breathing or chest wall motion.  See orders and recommendations. Thoracic spine films will be performed. Bone scan may be necessary to define the etiology of her pain.

## 2015-01-14 NOTE — Patient Instructions (Signed)
Please assess response to the tramadol pain medication.  If the blood count and x-rays are nondiagnostic; a nuclear bone scan will be necessary.

## 2015-01-15 ENCOUNTER — Telehealth: Payer: Self-pay | Admitting: Internal Medicine

## 2015-01-15 DIAGNOSIS — G5601 Carpal tunnel syndrome, right upper limb: Secondary | ICD-10-CM | POA: Diagnosis not present

## 2015-01-15 LAB — D-DIMER, QUANTITATIVE (NOT AT ARMC): D DIMER QUANT: 0.37 ug{FEU}/mL (ref 0.00–0.48)

## 2015-01-15 NOTE — Telephone Encounter (Signed)
LVM for pt to call back, also sending lab results in the mail.

## 2015-01-15 NOTE — Telephone Encounter (Signed)
Pt call back in for you

## 2015-01-15 NOTE — Telephone Encounter (Signed)
Patient is requesting call back with lab results.  °

## 2015-01-16 ENCOUNTER — Other Ambulatory Visit: Payer: Self-pay | Admitting: Internal Medicine

## 2015-01-16 ENCOUNTER — Telehealth: Payer: Self-pay | Admitting: Internal Medicine

## 2015-01-16 DIAGNOSIS — M546 Pain in thoracic spine: Principal | ICD-10-CM

## 2015-01-16 DIAGNOSIS — G8929 Other chronic pain: Secondary | ICD-10-CM

## 2015-01-16 NOTE — Telephone Encounter (Signed)
See order(s).

## 2015-01-16 NOTE — Telephone Encounter (Signed)
Please advise 

## 2015-01-16 NOTE — Telephone Encounter (Signed)
Spoke with pt and informed her that the lab orders are in, she stated that she will come in tomorrow 01/17/15 to get the labs done

## 2015-01-16 NOTE — Telephone Encounter (Signed)
Can you please call patient She is requesting a urinary test to test for bence and a couple other things I couldn't understand what she said and she didn't know how to spell them. It is regarding the same pain that she was in for the other day.  Her son is a doctor in another state and he told her to ask for this test.  Please advise

## 2015-01-17 ENCOUNTER — Other Ambulatory Visit (INDEPENDENT_AMBULATORY_CARE_PROVIDER_SITE_OTHER): Payer: Medicare Other

## 2015-01-17 DIAGNOSIS — G8929 Other chronic pain: Secondary | ICD-10-CM

## 2015-01-17 DIAGNOSIS — M546 Pain in thoracic spine: Secondary | ICD-10-CM | POA: Diagnosis not present

## 2015-01-17 LAB — BASIC METABOLIC PANEL
BUN: 10 mg/dL (ref 6–23)
CALCIUM: 9.6 mg/dL (ref 8.4–10.5)
CHLORIDE: 104 meq/L (ref 96–112)
CO2: 30 meq/L (ref 19–32)
CREATININE: 0.68 mg/dL (ref 0.40–1.20)
GFR: 90.33 mL/min (ref >60.00)
Glucose, Bld: 92 mg/dL (ref 70–99)
Potassium: 4.6 mEq/L (ref 3.5–5.1)
Sodium: 141 mEq/L (ref 135–145)

## 2015-01-17 LAB — CBC WITH DIFFERENTIAL/PLATELET
BASOS PCT: 0.5 % (ref 0.0–3.0)
Basophils Absolute: 0 10*3/uL (ref 0.0–0.1)
EOS PCT: 2.8 % (ref 0.0–5.0)
Eosinophils Absolute: 0.2 10*3/uL (ref 0.0–0.7)
HEMATOCRIT: 39.8 % (ref 36.0–46.0)
Hemoglobin: 13.2 g/dL (ref 12.0–15.0)
LYMPHS PCT: 37.9 % (ref 12.0–46.0)
Lymphs Abs: 3 10*3/uL (ref 0.7–4.0)
MCHC: 33 g/dL (ref 30.0–36.0)
MCV: 90.9 fl (ref 78.0–100.0)
MONO ABS: 0.5 10*3/uL (ref 0.1–1.0)
MONOS PCT: 6.1 % (ref 3.0–12.0)
NEUTROS ABS: 4.2 10*3/uL (ref 1.4–7.7)
NEUTROS PCT: 52.7 % (ref 43.0–77.0)
Platelets: 273 10*3/uL (ref 150.0–400.0)
RBC: 4.38 Mil/uL (ref 3.87–5.11)
RDW: 15 % (ref 11.5–15.5)
WBC: 7.9 10*3/uL (ref 4.0–10.5)

## 2015-01-18 DIAGNOSIS — M545 Low back pain: Secondary | ICD-10-CM | POA: Diagnosis not present

## 2015-01-18 DIAGNOSIS — M25551 Pain in right hip: Secondary | ICD-10-CM | POA: Diagnosis not present

## 2015-01-18 DIAGNOSIS — M7061 Trochanteric bursitis, right hip: Secondary | ICD-10-CM | POA: Diagnosis not present

## 2015-01-18 DIAGNOSIS — M25552 Pain in left hip: Secondary | ICD-10-CM | POA: Diagnosis not present

## 2015-01-21 LAB — UIFE/LIGHT CHAINS/TP QN, 24-HR UR
ALBUMIN, U: DETECTED
Alpha 1, Urine: DETECTED — AB
Alpha 2, Urine: DETECTED — AB
BETA UR: DETECTED — AB
GAMMA UR: DETECTED — AB
TOTAL PROTEIN, URINE-UPE24: 14 mg/dL (ref 5–24)

## 2015-01-28 DIAGNOSIS — G5601 Carpal tunnel syndrome, right upper limb: Secondary | ICD-10-CM | POA: Diagnosis not present

## 2015-01-29 ENCOUNTER — Ambulatory Visit: Payer: PRIVATE HEALTH INSURANCE | Admitting: Internal Medicine

## 2015-01-31 ENCOUNTER — Ambulatory Visit (INDEPENDENT_AMBULATORY_CARE_PROVIDER_SITE_OTHER): Payer: Medicare Other | Admitting: Internal Medicine

## 2015-01-31 ENCOUNTER — Encounter: Payer: Self-pay | Admitting: Internal Medicine

## 2015-01-31 VITALS — BP 142/80 | HR 72 | Ht <= 58 in | Wt 134.5 lb

## 2015-01-31 DIAGNOSIS — R9431 Abnormal electrocardiogram [ECG] [EKG]: Secondary | ICD-10-CM

## 2015-01-31 DIAGNOSIS — R0789 Other chest pain: Secondary | ICD-10-CM

## 2015-01-31 DIAGNOSIS — R0602 Shortness of breath: Secondary | ICD-10-CM

## 2015-01-31 DIAGNOSIS — R0609 Other forms of dyspnea: Secondary | ICD-10-CM

## 2015-01-31 DIAGNOSIS — Z8679 Personal history of other diseases of the circulatory system: Secondary | ICD-10-CM | POA: Diagnosis not present

## 2015-01-31 DIAGNOSIS — R079 Chest pain, unspecified: Secondary | ICD-10-CM

## 2015-01-31 DIAGNOSIS — R0600 Dyspnea, unspecified: Secondary | ICD-10-CM

## 2015-01-31 NOTE — Patient Instructions (Signed)
Dr. Debara Pickett recommends that you pick up your prescription for etodolac - if not, please call your prescriber.   Your physician has requested that you have a lexiscan myoview. For further information please visit [email or web address]. Please follow instruction sheet, as given.  Your physician recommends that you schedule a follow-up appointment in 2-3 weeks with Dr. Debara Pickett after your test  [facility] recommends that you STOP pravastatin for 1 week

## 2015-02-01 DIAGNOSIS — R079 Chest pain, unspecified: Secondary | ICD-10-CM | POA: Insufficient documentation

## 2015-02-01 DIAGNOSIS — R0609 Other forms of dyspnea: Secondary | ICD-10-CM | POA: Insufficient documentation

## 2015-02-01 DIAGNOSIS — R0600 Dyspnea, unspecified: Secondary | ICD-10-CM | POA: Insufficient documentation

## 2015-02-01 NOTE — Progress Notes (Signed)
OFFICE NOTE  Chief Complaint:  "I'm not sure why I'm here"  Primary Care Physician: Unice Cobble, MD  HPI:  Pamela Blair is a pleasant 74 year old female who was present followed by Dr. Linna Darner and currently is seeing Dr. Hardie Shackleton. She was referred for evaluation of chest pain per Dr. Huntley Estelle notes, however she is "not clear why she is here". She occasionally has some chest discomfort in fact she notes some pain in her left upper chest and left shoulder blade particular when moving and lifting things. She gets pain it goes down her left arm. She does have a history of neck surgery from an anterior approach. 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 She does have a prior spinal compression fracture. In addition she seemed to have aortic atherosclerosis on imaging. A family history is not significant for any coronary disease. She does have some Arctic risk factors including dyslipidemia and was recently started on pravastatin. She was also prescribed etodolac for arthritis, but she never got the prescription filled and is not taking it. Her chest pain is not necessarily worse with exertion or relieved by rest. She does report some increasing shortness of breath which is unusual for her.  PMHx:  Past Medical History  Diagnosis Date  . Cervical spondylosis   . Osteoporosis   . Cervical spinal stenosis   . Hyperlipidemia     Past Surgical History  Procedure Laterality Date  . Cesarean section      x 4  . Abdominal hysterectomy      and BSO, dx- dysfunctional bleeding  . 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 Anterior cervical decomp/discectomy fusion  10/3-10/2010    Dr Pamala Hurry , [place] , [place]   . Colonoscopy      Dr Deatra Ina    FAMHx:  Family History  Problem Relation Age of Onset  . Stroke Mother 109  . Heart attack Father 61  . Diabetes Neg Hx   . Cancer Neg Hx     SOCHx:   reports that she has never smoked. She does not have any smokeless tobacco history on file. She reports that she does not drink alcohol or use illicit drugs.  ALLERGIES:  Allergies  Allergen Reactions  . Niacin     rash  . Clarithromycin     ? reaction  . Neomycin Rash    ROS: A comprehensive review of systems was negative except for: Respiratory: positive for dyspnea on exertion Cardiovascular: positive for chest pain Musculoskeletal: positive for myalgias  HOME MEDS: Current Outpatient Prescriptions  Medication Sig Dispense Refill  . buPROPion (WELLBUTRIN SR) 150 MG 12 hr tablet Take 150 mg by mouth 2 (two) times daily.    Marland Kitchen ibuprofen (ADVIL,MOTRIN) 800 MG tablet Take 1 tablet by mouth as needed.    . pravastatin (PRAVACHOL) 20 MG tablet Take 1 tablet (20 mg total) by mouth daily. 90 tablet 1  . traMADol (ULTRAM) 50 MG tablet Take 1 tablet (50 mg total) by mouth every 8 (eight) hours as needed. 30 tablet 0   No current facility-administered medications for this visit.    LABS/IMAGING: No results found for this or any previous visit (from the past 48 hour(s)). No results found.  WEIGHTS: Wt Readings from Last 3 Encounters:  01/31/15 134 lb 8 oz (61.009 kg)  01/14/15 135 lb (61.236 kg)  12/28/14  136 lb 8 oz (61.916 kg)    VITALS: BP 142/80 mmHg  Pulse 72  Ht 4\' 10"  (1.473 m)  Wt 134 lb 8 oz (61.009 kg)  BMI 28.12 kg/m2  EXAM: General appearance: alert and no distress Neck: no carotid bruit and no JVD Lungs: clear to auscultation bilaterally Heart: regular rate and rhythm, S1, S2 normal, no murmur, click, rub or gallop Abdomen: soft, non-tender; bowel sounds normal; no masses,  no organomegaly Extremities: Reproducible left upper chest and left upper scapular pain on palpation Pulses: 2+ and symmetric Skin: Skin color, texture, turgor normal. No rashes or lesions Neurologic: Grossly normal Psych: Pleasant  EKG: Normal sinus rhythm at 72, nonspecific ST  changes  ASSESSMENT: 1. Left upper chest and upper back pain which is mostly reproducible, suspect musculoskeletal 2. Progressive shortness of breath with unclear explanation 3. Aortic atherosclerosis 4. Cervical and thoracic spine disease 5. 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   Mrs. Mahr is describing musculoskeletal pain mostly in the left upper chest and upper back which is reproducible and worse with movement. For this I recommend that she take her prescription anti-inflammatory from her primary care provider. She is also describing shortness of breath with exertion and has some aortic atherosclerosis. The could be underlying coronary artery disease. It is reasonable to consider stress testing given her age and risk factors. Also she is describing significant cramping in her legs and soreness in her thigh muscles. 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 I recommend she stop her pravastatin for least one week to see if her symptoms improve and then follow-up with her primary care provider for other options.  Plan to see her back to discuss results of her stress test in a few weeks. Thanks as always for the kind referral.  Pixie Casino, MD, [facility] Attending Cardiologist [place] 02/01/2015, 7:55 AM

## 2015-02-05 ENCOUNTER — Telehealth (HOSPITAL_COMMUNITY): Payer: Self-pay

## 2015-02-05 NOTE — Telephone Encounter (Signed)
Encounter complete. 

## 2015-02-07 ENCOUNTER — Ambulatory Visit (HOSPITAL_COMMUNITY)
Admission: RE | Admit: 2015-02-07 | Discharge: 2015-02-07 | Disposition: A | Discharge status: Home / Self Care | Payer: Medicare Other | Source: Ambulatory Visit | Attending: Cardiovascular Disease | Admitting: Cardiovascular Disease

## 2015-02-07 DIAGNOSIS — R079 Chest pain, unspecified: Secondary | ICD-10-CM

## 2015-02-07 DIAGNOSIS — Z8679 Personal history of other diseases of the circulatory system: Secondary | ICD-10-CM

## 2015-02-07 DIAGNOSIS — R0602 Shortness of breath: Secondary | ICD-10-CM

## 2015-02-07 LAB — MYOCARDIAL PERFUSION IMAGING
LVDIAVOL: 55 mL
LVSYSVOL: 20 mL
Peak HR: 93 {beats}/min
Rest HR: 65 {beats}/min
SDS: 3
SRS: 2
SSS: 5
TID: 1.35

## 2015-02-07 MED ORDER — REGADENOSON 0.4 MG/5ML IV SOLN
0.4000 mg | Freq: Once | INTRAVENOUS | Status: AC
  Administered 2015-02-07: 0.4 mg via INTRAVENOUS

## 2015-02-07 MED ORDER — TECHNETIUM TC 99M SESTAMIBI GENERIC - CARDIOLITE
31.6000 | Freq: Once | INTRAVENOUS | Status: AC | PRN
  Administered 2015-02-07: 32 via INTRAVENOUS

## 2015-02-07 MED ORDER — TECHNETIUM TC 99M SESTAMIBI GENERIC - CARDIOLITE
10.6000 | Freq: Once | INTRAVENOUS | Status: AC | PRN
  Administered 2015-02-07: 11 via INTRAVENOUS

## 2015-02-12 ENCOUNTER — Encounter: Payer: Self-pay | Admitting: Internal Medicine

## 2015-02-12 ENCOUNTER — Ambulatory Visit (INDEPENDENT_AMBULATORY_CARE_PROVIDER_SITE_OTHER): Payer: Medicare Other | Admitting: Internal Medicine

## 2015-02-12 VITALS — BP 130/70 | HR 76 | Ht <= 58 in | Wt 134.2 lb

## 2015-02-12 DIAGNOSIS — R0789 Other chest pain: Secondary | ICD-10-CM

## 2015-02-12 DIAGNOSIS — H25013 Cortical age-related cataract, bilateral: Secondary | ICD-10-CM | POA: Diagnosis not present

## 2015-02-12 DIAGNOSIS — H2513 Age-related nuclear cataract, bilateral: Secondary | ICD-10-CM | POA: Diagnosis not present

## 2015-02-12 MED ORDER — IBUPROFEN 800 MG PO TABS: 800.0000 mg | ORAL_TABLET | Freq: Three times a day (TID) | ORAL | Status: DC

## 2015-02-12 NOTE — Patient Instructions (Signed)
Dr Debara Pickett recommends that you take Ibuprofen 800 mg - take 1 tablet by mouth 3 times daily, with food, for 2 weeks.  Dr Debara Pickett will follow-up with you as needed.

## 2015-02-12 NOTE — Progress Notes (Signed)
OFFICE NOTE  Chief Complaint:  Follow-up stress test  Primary Care Physician: Unice Cobble, MD  HPI:  Pamela Blair is a pleasant 74 year old female who was present followed by Dr. Linna Darner and currently is seeing Dr. Crist Infante. She was referred for evaluation of chest pain per Dr. Silvestre Mesi notes, however she is "not clear why she is here". She occasionally has some chest discomfort in fact she notes some pain in her left upper chest and left shoulder blade particular when moving and lifting things. She gets pain it goes down her left arm. She does have a history of neck surgery from an anterior approach. 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 She does have a prior spinal compression fracture. In addition she seemed to have aortic atherosclerosis on imaging. A family history is not significant for any coronary disease. She does have some Arctic risk factors including dyslipidemia and was recently started on pravastatin. She was also prescribed etodolac for arthritis, but she never got the prescription filled and is not taking it. Her chest pain is not necessarily worse with exertion or relieved by rest. She does report some increasing shortness of breath which is unusual for her.  I saw Pamela Blair back in the office today. This is a follow-up of her stress test which was negative for ischemia and showed a preserved EF of 64%. As previously mentioned her chest pain did seem atypical. We previously discussed stopping her pravastatin to see if her leg pain and cramping improved. It has totally gone away and therefore I suspect this was the cause of it. She wishes to discuss with her primary doctor about possibly trying a different type of cholesterol medicine. I also encouraged her to take ibuprofen on a scheduled basis at her last appointment but she says she forgot to do that. She does note some mild improvement in her chest wall pain and I do  believe this is related to her spinal arthritis. I recommended she take Motrin 800 mg 3 times daily for up to 2 weeks to see if it improves her symptoms. We went ahead and gave her another prescription for that today.  PMHx:  Past Medical History  Diagnosis Date  . Cervical spondylosis   . Osteoporosis   . Cervical spinal stenosis   . Hyperlipidemia     Past Surgical History  Procedure Laterality Date  . Cesarean section      x 4  . Abdominal hysterectomy      and BSO, dx- dysfunctional bleeding  . 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 Anterior cervical decomp/discectomy fusion  10/3-10/2010    Dr Pamala Hurry , [place] , [place]   . Colonoscopy      Dr Deatra Ina    FAMHx:  Family History  Problem Relation Age of Onset  . Stroke Mother 78  . Heart attack Father 22  . Diabetes Neg Hx   . Cancer Neg Hx     SOCHx:   reports that she has never smoked. She does not have any smokeless tobacco history on file. She reports that she does not drink alcohol or use illicit drugs.  ALLERGIES:  Allergies  Allergen Reactions  . Niacin     rash  . Clarithromycin     ? reaction  . Neomycin Rash    ROS: A comprehensive review of systems was negative.  HOME MEDS: Current Outpatient Prescriptions  Medication Sig Dispense Refill  . buPROPion (WELLBUTRIN SR) 150 MG 12 hr tablet Take 150 mg by mouth 2 (two) times daily.    Marland Kitchen ibuprofen (ADVIL,MOTRIN) 800 MG tablet Take 1 tablet (800 mg total) by mouth 3 (three) times daily. 120 tablet 0  . pravastatin (PRAVACHOL) 20 MG tablet Take 1 tablet (20 mg total) by mouth daily. 90 tablet 1  . traMADol (ULTRAM) 50 MG tablet Take 1 tablet (50 mg total) by mouth every 8 (eight) hours as needed. 30 tablet 0   No current facility-administered medications for this visit.    LABS/IMAGING: No results found for this or any previous visit (from the past 48 hour(s)). No results found.  WEIGHTS: Wt Readings from  Last 3 Encounters:  02/12/15 134 lb 3.2 oz (60.873 kg)  02/07/15 134 lb (60.782 kg)  01/31/15 134 lb 8 oz (61.009 kg)    VITALS: BP 130/70 mmHg  Pulse 76  Ht 4\' 8"  (1.422 m)  Wt 134 lb 3.2 oz (60.873 kg)  BMI 30.10 kg/m2  EXAM: Deferred  EKG: Deferred  ASSESSMENT: 1. Left upper chest and upper back pain which is mostly reproducible, suspect musculoskeletal 2. Dyspnea on exertion-low risk nuclear stress test 3. Aortic atherosclerosis 4. Cervical and thoracic spine disease 5. 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   Pamela Blair had a low risk nuclear stress test which was negative for ischemia. Her EF was preserved at 64%. I suspect her chest pain symptoms are related to chest wall pain and perhaps her spinal arthritis. She may benefit from anti-inflammatory therapy. I recommended ibuprofen 800 mg 3 times a day for 2 weeks. She was concerned about taking this before because she thought she might become addicted to it. I reassured her this is not a habit forming or dependent medication. She should follow-up with her primary care provider regarding her atherosclerosis and what I believe is a statin related myalgias. It seems that her symptoms have improved off of pravastatin and she may need an alternative statin medication.  I'm happy to see her back on an as-needed basis, however continued aggressive primary prevention for coronary disease can be skillfully accomplished by Dr. Joylene Draft.  Pixie Casino, MD, [facility] Attending Cardiologist [place] 02/12/2015, 3:18 PM

## 2015-03-04 ENCOUNTER — Telehealth: Payer: Self-pay | Admitting: Gastroenterology

## 2015-03-04 NOTE — Telephone Encounter (Signed)
I don't see the colonoscopy report. Did she get it done at another GI?

## 2015-03-04 NOTE — Telephone Encounter (Signed)
Left message for patient to return my call.

## 2015-03-06 ENCOUNTER — Ambulatory Visit: Payer: Medicare Other | Admitting: Pharmacist Clinician (PhC)/ Clinical Pharmacy Specialist

## 2015-04-02 ENCOUNTER — Encounter: Payer: Self-pay | Admitting: Internal Medicine

## 2015-04-17 ENCOUNTER — Ambulatory Visit (HOSPITAL_COMMUNITY): Payer: Medicare Other

## 2015-04-23 DIAGNOSIS — E663 Overweight: Secondary | ICD-10-CM | POA: Diagnosis not present

## 2015-04-23 DIAGNOSIS — Z1231 Encounter for screening mammogram for malignant neoplasm of breast: Secondary | ICD-10-CM | POA: Diagnosis not present

## 2015-04-23 DIAGNOSIS — Z6825 Body mass index (BMI) 25.0-25.9, adult: Secondary | ICD-10-CM | POA: Diagnosis not present

## 2015-04-23 DIAGNOSIS — Z01419 Encounter for gynecological examination (general) (routine) without abnormal findings: Secondary | ICD-10-CM | POA: Diagnosis not present

## 2015-05-14 ENCOUNTER — Encounter: Payer: Medicare Other | Admitting: Cardiovascular Disease

## 2015-05-21 DIAGNOSIS — S8001XA Contusion of right knee, initial encounter: Secondary | ICD-10-CM | POA: Diagnosis not present

## 2015-05-21 DIAGNOSIS — E663 Overweight: Secondary | ICD-10-CM | POA: Diagnosis not present

## 2015-05-21 DIAGNOSIS — S5001XA Contusion of right elbow, initial encounter: Secondary | ICD-10-CM | POA: Diagnosis not present

## 2015-05-21 DIAGNOSIS — S300XXA Contusion of lower back and pelvis, initial encounter: Secondary | ICD-10-CM | POA: Diagnosis not present

## 2015-05-27 ENCOUNTER — Ambulatory Visit (INDEPENDENT_AMBULATORY_CARE_PROVIDER_SITE_OTHER)
Admission: RE | Admit: 2015-05-27 | Discharge: 2015-05-27 | Disposition: A | Discharge status: Home / Self Care | Payer: Medicare Other | Source: Ambulatory Visit | Attending: Internal Medicine | Admitting: Internal Medicine

## 2015-05-27 ENCOUNTER — Ambulatory Visit (INDEPENDENT_AMBULATORY_CARE_PROVIDER_SITE_OTHER): Payer: Medicare Other | Admitting: Internal Medicine

## 2015-05-27 ENCOUNTER — Encounter: Payer: Self-pay | Admitting: Internal Medicine

## 2015-05-27 VITALS — BP 130/82 | HR 84 | Temp 97.7°F | Wt 133.0 lb

## 2015-05-27 DIAGNOSIS — J209 Acute bronchitis, unspecified: Secondary | ICD-10-CM

## 2015-05-27 DIAGNOSIS — R0989 Other specified symptoms and signs involving the circulatory and respiratory systems: Secondary | ICD-10-CM | POA: Diagnosis not present

## 2015-05-27 MED ORDER — PREDNISONE 20 MG PO TABS: 20.0000 mg | ORAL_TABLET | Freq: Two times a day (BID) | ORAL | Status: DC

## 2015-05-27 MED ORDER — AMOXICILLIN-POT CLAVULANATE 875-125 MG PO TABS: 1.0000 | ORAL_TABLET | Freq: Two times a day (BID) | ORAL | Status: DC

## 2015-05-27 MED ORDER — HYDROCODONE-HOMATROPINE 5-1.5 MG/5ML PO SYRP: 5.0000 mL | ORAL_SOLUTION | Freq: Four times a day (QID) | ORAL | Status: DC | PRN

## 2015-05-27 NOTE — Progress Notes (Signed)
   Subjective:    Patient ID: Pamela Blair, female    DOB: [birth year], 74 y.o.   MRN: [id number]  HPI Her symptoms began 2 weeks ago as a "cold". Initially she had a sore throat in the context of cough and rhinorrhea. She also had some associated headache which was not persistent. Congestion increased over the last 48 hours. She describes swelling her throat yesterday with some dysphagia. The cough is productive of clear secretions. The rhinorrhea has been yellow but subsequently is clear. She has some maxillary sinus pressure.  She had taken Alka-Seltzer plus and use Vicks with some response.  She denies any extrinsic symptoms. She is not having other upper respiratory tract symptoms.   Review of Systems Frontal headache, dental pain,  otic pain or otic discharge denied. No fever , chills or sweats.  Extrinsic symptoms of itchy, watery eyes, sneezing, or angioedema are denied. There is no wheezing or  paroxysmal nocturnal dyspnea.     Objective:   Physical Exam General appearance:Adequately nourished; no acute distress or increased work of breathing is present.    Lymphatic: No  lymphadenopathy about the head, neck, or axilla .  Eyes: No conjunctival inflammation or lid edema is present. There is no scleral icterus.  Ears:  External ear exam shows no significant lesions or deformities.  Otoscopic examination reveals clear canals, tympanic membranes are intact bilaterally without bulging, retraction, inflammation or discharge.  Nose:  External nasal examination shows no deformity or inflammation. Nasal mucosa are pink and moist without lesions or exudates No septal dislocation or deviation.No obstruction to airflow.   Oral exam: Dental hygiene is good; lips and gums are healthy appearing.There is no oropharyngeal erythema or exudate .  Neck:  No deformities, thyromegaly, masses, or tenderness noted.   Supple with full range of motion without pain.   Heart:  Normal rate and regular  rhythm. S1 and S2 normal without gallop, murmur, click, rub or other extra sounds.   Lungs: She has a raspy cough. She has persistent inspiratory pops and rhonchi in the right lower lobe and right anterior chest.  Extremities:  No cyanosis, edema, or clubbing  noted    Skin: Warm & dry w/o tenting or jaundice. No significant lesions or rash.     Assessment & Plan:  #1 acute bronchitis; rule out right lower lobe pneumonia.  See orders and recommendations

## 2015-05-27 NOTE — Patient Instructions (Signed)
  Your next office appointment will be determined based upon review of your pending  xrays  Those written interpretation of the lab results and instructions will be transmitted to you by mail for your records.  Critical results will be called.   Followup as needed for any active or acute issue. Please report any significant change in your symptoms. 

## 2015-05-27 NOTE — Progress Notes (Signed)
Pre visit review using our clinic review tool, if applicable. No additional management support is needed unless otherwise documented below in the visit note. 

## 2015-06-20 ENCOUNTER — Telehealth: Payer: Self-pay

## 2015-06-20 NOTE — Telephone Encounter (Signed)
Left Voice Mail for pt to call back.   RE: Flu Vaccine for 2016  

## 2015-07-02 ENCOUNTER — Encounter: Payer: Self-pay | Admitting: Internal Medicine

## 2015-07-02 ENCOUNTER — Ambulatory Visit (INDEPENDENT_AMBULATORY_CARE_PROVIDER_SITE_OTHER)
Admission: RE | Admit: 2015-07-02 | Discharge: 2015-07-02 | Disposition: A | Discharge status: Home / Self Care | Payer: Medicare Other | Source: Ambulatory Visit | Attending: Internal Medicine | Admitting: Internal Medicine

## 2015-07-02 ENCOUNTER — Ambulatory Visit (INDEPENDENT_AMBULATORY_CARE_PROVIDER_SITE_OTHER): Payer: Medicare Other | Admitting: Internal Medicine

## 2015-07-02 ENCOUNTER — Ambulatory Visit: Payer: Medicare Other | Admitting: Internal Medicine

## 2015-07-02 VITALS — BP 126/82 | HR 79 | Temp 98.3°F | Resp 20 | Wt 133.2 lb

## 2015-07-02 DIAGNOSIS — S20219A Contusion of unspecified front wall of thorax, initial encounter: Secondary | ICD-10-CM

## 2015-07-02 DIAGNOSIS — R0789 Other chest pain: Secondary | ICD-10-CM | POA: Diagnosis not present

## 2015-07-02 DIAGNOSIS — E559 Vitamin D deficiency, unspecified: Secondary | ICD-10-CM | POA: Diagnosis not present

## 2015-07-02 DIAGNOSIS — R079 Chest pain, unspecified: Secondary | ICD-10-CM | POA: Diagnosis not present

## 2015-07-02 MED ORDER — NAPROXEN SODIUM 220 MG PO TABS: 220.0000 mg | ORAL_TABLET | Freq: Two times a day (BID) | ORAL | Status: DC

## 2015-07-02 MED ORDER — TRAMADOL HCL 50 MG PO TABS: 50.0000 mg | ORAL_TABLET | Freq: Three times a day (TID) | ORAL | Status: DC | PRN

## 2015-07-02 MED ORDER — VITAMIN D3 50 MCG (2000 UT) PO CAPS: 2000.0000 [IU] | ORAL_CAPSULE | Freq: Every day | ORAL | Status: DC

## 2015-07-02 MED ORDER — VITAMIN D3 50 MCG (2000 UT) PO CAPS: 2000.0000 [IU] | ORAL_CAPSULE | Freq: Every day | ORAL | Status: AC

## 2015-07-02 NOTE — Progress Notes (Signed)
Subjective:  Patient ID: Pamela Blair, female    DOB: 1940-10-22  Age: 74 y.o. MRN: [id number]  CC: No chief complaint on file.   HPI Pamela Blair presents for anterior CP after she tripped and fell last wk on Wed and hit her front chest against the chair. R side hurts more than left. Aleve helped  Outpatient Prescriptions Prior to Visit  Medication Sig Dispense Refill  . buPROPion (WELLBUTRIN SR) 150 MG 12 hr tablet Take 150 mg by mouth 2 (two) times daily.    . pravastatin (PRAVACHOL) 20 MG tablet Take 1 tablet (20 mg total) by mouth daily. 90 tablet 1  . amoxicillin-clavulanate (AUGMENTIN) 875-125 MG tablet Take 1 tablet by mouth every 12 (twelve) hours. Take with meal 20 tablet 0  . HYDROcodone-homatropine (HYDROMET) 5-1.5 MG/5ML syrup Take 5 mLs by mouth every 6 (six) hours as needed for cough. 120 mL 0  . ibuprofen (ADVIL,MOTRIN) 800 MG tablet Take 1 tablet (800 mg total) by mouth 3 (three) times daily. 120 tablet 0  . predniSONE (DELTASONE) 20 MG tablet Take 1 tablet (20 mg total) by mouth 2 (two) times daily. 14 tablet 0  . traMADol (ULTRAM) 50 MG tablet Take 1 tablet (50 mg total) by mouth every 8 (eight) hours as needed. 30 tablet 0   No facility-administered medications prior to visit.    ROS Review of Systems  Constitutional: Negative for chills, activity change, appetite change, fatigue and unexpected weight change.  HENT: Negative for congestion, mouth sores and sinus pressure.   Eyes: Negative for visual disturbance.  Respiratory: Negative for cough, chest tightness, shortness of breath and wheezing.   Cardiovascular: Positive for chest pain. Negative for leg swelling.  Gastrointestinal: Negative for nausea and abdominal pain.  Genitourinary: Negative for frequency, difficulty urinating and vaginal pain.  Musculoskeletal: Negative for back pain and gait problem.  Skin: Negative for pallor and rash.  Neurological: Negative for dizziness, tremors, weakness, numbness  and headaches.  Psychiatric/Behavioral: Negative for confusion and sleep disturbance.    Objective:  BP 126/82 mmHg  Pulse 79  Temp(Src) 98.3 F (36.8 C) (Oral)  Resp 20  Wt 133 lb 4 oz (60.442 kg)  SpO2 99%  BP Readings from Last 3 Encounters:  07/02/15 126/82  05/27/15 130/82  02/12/15 130/70    Wt Readings from Last 3 Encounters:  07/02/15 133 lb 4 oz (60.442 kg)  05/27/15 133 lb (60.328 kg)  02/12/15 134 lb 3.2 oz (60.873 kg)    Physical Exam  Constitutional: She appears well-developed. No distress.  HENT:  Head: Normocephalic.  Right Ear: External ear normal.  Left Ear: External ear normal.  Nose: Nose normal.  Mouth/Throat: Oropharynx is clear and moist.  Eyes: Conjunctivae are normal. Pupils are equal, round, and reactive to light. Right eye exhibits no discharge. Left eye exhibits no discharge.  Neck: Normal range of motion. Neck supple. No JVD present. No tracheal deviation present. No thyromegaly present.  Cardiovascular: Normal rate, regular rhythm and normal heart sounds.   Pulmonary/Chest: No stridor. No respiratory distress. She has no wheezes. She exhibits tenderness.  Abdominal: Soft. Bowel sounds are normal. She exhibits no distension and no mass. There is no tenderness. There is no rebound and no guarding.  Musculoskeletal: She exhibits tenderness. She exhibits no edema.  Lymphadenopathy:    She has no cervical adenopathy.  Neurological: She displays normal reflexes. No cranial nerve deficit. She exhibits normal muscle tone. Coordination normal.  Skin: No rash noted. No  erythema.  Psychiatric: She has a normal mood and affect. Her behavior is normal. Judgment and thought content normal.   Anterior CP is tender over ribs R>L - upper chest Lab Results  Component Value Date   WBC 7.9 01/17/2015   HGB 13.2 01/17/2015   HCT 39.8 01/17/2015   PLT 273.0 01/17/2015   GLUCOSE 92 01/17/2015   CHOL 204* 10/17/2013   TRIG 60.0 10/17/2013   HDL 58.10  10/17/2013   LDLDIRECT 158.8 11/11/2012   LDLCALC 134* 10/17/2013   ALT 18 10/17/2013   AST 16 10/17/2013   NA 141 01/17/2015   K 4.6 01/17/2015   CL 104 01/17/2015   CREATININE 0.68 01/17/2015   BUN 10 01/17/2015   CO2 30 01/17/2015   TSH 2.77 11/11/2012   HGBA1C 5.1 11/11/2012    Dg Chest 2 View  05/28/2015  CLINICAL DATA:  Cold, congestion, right lower lobe ronchi EXAM: CHEST  2 VIEW COMPARISON:  01/14/2015 FINDINGS: Cardiomediastinal silhouette is stable. Thoracic spine osteopenia again noted. Stable compression deformities thoracic spine. No acute infiltrate or pulmonary edema. Mild infrahilar bronchitic changes right greater than left. Atherosclerotic calcifications of thoracic aorta. Metallic fixation plate noted cervical spine. IMPRESSION: No infiltrate or pulmonary edema. Mild infrahilar bronchitic changes right greater than left. Electronically Signed   By: Lahoma Crocker M.D.   On: 05/28/2015 08:14    Assessment & Plan:   Diagnoses and all orders for this visit:  Left-sided chest wall pain -     traMADol (ULTRAM) 50 MG tablet; Take 1 tablet (50 mg total) by mouth every 8 (eight) hours as needed. -     DG Chest 2 View  Chest wall contusion, unspecified laterality, initial encounter -     DG Chest 2 View  Vitamin D deficiency  Other orders -     naproxen sodium (ALEVE) 220 MG tablet; Take 1 tablet (220 mg total) by mouth 2 (two) times daily with a meal. -     Discontinue: Cholecalciferol (VITAMIN D3) 2000 UNITS capsule; Take 1 capsule (2,000 Units total) by mouth daily. -     Cholecalciferol (VITAMIN D3) 2000 UNITS capsule; Take 1 capsule (2,000 Units total) by mouth daily.   I have discontinued Ms. Spargur's ibuprofen, HYDROcodone-homatropine, predniSONE, and amoxicillin-clavulanate. I am also having her start on naproxen sodium. Additionally, I am having her maintain her buPROPion, pravastatin, traMADol, and Vitamin D3.  Meds ordered this encounter  Medications  .  traMADol (ULTRAM) 50 MG tablet    Sig: Take 1 tablet (50 mg total) by mouth every 8 (eight) hours as needed.    Dispense:  60 tablet    Refill:  1  . naproxen sodium (ALEVE) 220 MG tablet    Sig: Take 1 tablet (220 mg total) by mouth 2 (two) times daily with a meal.    Dispense:  60 tablet    Refill:  1  . DISCONTD: Cholecalciferol (VITAMIN D3) 2000 UNITS capsule    Sig: Take 1 capsule (2,000 Units total) by mouth daily.    Dispense:  100 capsule    Refill:  3  . Cholecalciferol (VITAMIN D3) 2000 UNITS capsule    Sig: Take 1 capsule (2,000 Units total) by mouth daily.    Dispense:  100 capsule    Refill:  3     Follow-up: No Follow-up on file.  Walker Kehr, MD

## 2015-07-02 NOTE — Assessment & Plan Note (Signed)
12/16 R>L anterior CXR Tramadol prn  Potential benefits of a short term opioids use as well as potential risks (i.e. addiction risk, apnea etc) and complications (i.e. Somnolence, constipation and others) were explained to the patient and were aknowledged. Aleve prn

## 2015-07-02 NOTE — Progress Notes (Signed)
Pre visit review using our clinic review tool, if applicable. No additional management support is needed unless otherwise documented below in the visit note. 

## 2015-07-02 NOTE — Assessment & Plan Note (Signed)
Re-start Vit D 

## 2015-07-09 DIAGNOSIS — M2241 Chondromalacia patellae, right knee: Secondary | ICD-10-CM | POA: Diagnosis not present

## 2015-07-09 DIAGNOSIS — M1711 Unilateral primary osteoarthritis, right knee: Secondary | ICD-10-CM | POA: Diagnosis not present

## 2015-08-15 DIAGNOSIS — Z6827 Body mass index (BMI) 27.0-27.9, adult: Secondary | ICD-10-CM | POA: Diagnosis not present

## 2015-08-15 DIAGNOSIS — Z23 Encounter for immunization: Secondary | ICD-10-CM | POA: Diagnosis not present

## 2015-08-15 DIAGNOSIS — M79602 Pain in left arm: Secondary | ICD-10-CM | POA: Diagnosis not present

## 2015-08-15 DIAGNOSIS — M1990 Unspecified osteoarthritis, unspecified site: Secondary | ICD-10-CM | POA: Diagnosis not present

## 2015-08-15 DIAGNOSIS — E784 Other hyperlipidemia: Secondary | ICD-10-CM | POA: Diagnosis not present

## 2015-08-15 DIAGNOSIS — M810 Age-related osteoporosis without current pathological fracture: Secondary | ICD-10-CM | POA: Diagnosis not present

## 2015-08-15 DIAGNOSIS — T148 Other injury of unspecified body region: Secondary | ICD-10-CM | POA: Diagnosis not present

## 2015-08-15 DIAGNOSIS — I700 Atherosclerosis of aorta: Secondary | ICD-10-CM | POA: Diagnosis not present

## 2015-08-15 DIAGNOSIS — Z1389 Encounter for screening for other disorder: Secondary | ICD-10-CM | POA: Diagnosis not present

## 2015-08-16 ENCOUNTER — Encounter: Payer: Self-pay | Admitting: Gastroenterology

## 2015-08-19 DIAGNOSIS — M67431 Ganglion, right wrist: Secondary | ICD-10-CM | POA: Diagnosis not present

## 2015-08-19 DIAGNOSIS — M7582 Other shoulder lesions, left shoulder: Secondary | ICD-10-CM | POA: Diagnosis not present

## 2015-08-19 DIAGNOSIS — G5601 Carpal tunnel syndrome, right upper limb: Secondary | ICD-10-CM | POA: Diagnosis not present

## 2015-10-02 ENCOUNTER — Ambulatory Visit (AMBULATORY_SURGERY_CENTER): Payer: Self-pay

## 2015-10-02 VITALS — Ht <= 58 in | Wt 131.8 lb

## 2015-10-02 DIAGNOSIS — Z1211 Encounter for screening for malignant neoplasm of colon: Secondary | ICD-10-CM

## 2015-10-16 ENCOUNTER — Encounter: Payer: Medicare Other | Admitting: Gastroenterology

## 2015-10-21 DIAGNOSIS — E785 Hyperlipidemia, unspecified: Secondary | ICD-10-CM | POA: Diagnosis not present

## 2015-10-29 ENCOUNTER — Telehealth: Payer: Self-pay | Admitting: Gastroenterology

## 2015-10-29 DIAGNOSIS — Z1211 Encounter for screening for malignant neoplasm of colon: Secondary | ICD-10-CM

## 2015-10-29 MED ORDER — POLYETHYLENE GLYCOL 3350 17 GM/SCOOP PO POWD: 1.0000 | Freq: Every day | ORAL | Status: DC

## 2015-10-29 MED ORDER — BISACODYL-PEG-KCL-NABICAR-NACL 5-210 MG-GM PO KIT: 4.0000 | PACK | Freq: Once | ORAL | Status: DC

## 2015-10-29 MED ORDER — POLYETHYLENE GLYCOL 3350 17 GM/SCOOP PO POWD: 1.0000 | Freq: Once | ORAL | Status: DC

## 2015-10-30 ENCOUNTER — Telehealth: Payer: Self-pay | Admitting: Gastroenterology

## 2015-10-30 NOTE — Telephone Encounter (Signed)
Returned patient's call and she had started drinking her prep this morning and was supposed to have waited until 5 this evening.  She only drank 8 oz so i advised her to finish it at scheduled time and follow instructions for in the morning also.  She is to drink her second half of prep at 9 am in the morning and I advised her to stay on clear liquids.  She is to have nothing by mouth 3 hours prior to procedure.  She also wanted to know when to take the ducolax tablets.  I went over the instructions again with patient and all questions were answered.

## 2015-10-31 ENCOUNTER — Ambulatory Visit (AMBULATORY_SURGERY_CENTER): Payer: Medicare Other | Admitting: Gastroenterology

## 2015-10-31 ENCOUNTER — Encounter: Payer: Self-pay | Admitting: Gastroenterology

## 2015-10-31 VITALS — BP 123/72 | HR 68 | Temp 97.8°F | Resp 15 | Ht <= 58 in | Wt 131.0 lb

## 2015-10-31 DIAGNOSIS — Z1211 Encounter for screening for malignant neoplasm of colon: Secondary | ICD-10-CM

## 2015-10-31 DIAGNOSIS — D120 Benign neoplasm of cecum: Secondary | ICD-10-CM

## 2015-10-31 DIAGNOSIS — K635 Polyp of colon: Secondary | ICD-10-CM | POA: Diagnosis not present

## 2015-10-31 MED ORDER — SODIUM CHLORIDE 0.9 % IV SOLN: 500.0000 mL | INTRAVENOUS | Status: DC

## 2015-10-31 NOTE — Op Note (Signed)
[facility] Patient Name: Pamela Blair Procedure Date: 10/31/2015 2:06 PM MRN: [id number] Endoscopist: [place]. Loletha Carrow , MD Age: 75 Date of Birth: [birth year] Gender: Female Procedure:                Colonoscopy Indications:              Screening for malignant neoplasm in the colon Medicines:                Monitored Anesthesia Care Procedure:                Pre-Anesthesia Assessment:                           - Prior to the procedure, a History and Physical                            was performed, and patient medications and                            allergies were reviewed. The patient's tolerance of                            previous anesthesia was also reviewed. The risks                            and benefits of the procedure and the sedation                            options and risks were discussed with the patient.                            All questions were answered, and informed consent                            was obtained. Prior Anticoagulants: The patient has                            taken no previous anticoagulant or antiplatelet                            agents. ASA Grade Assessment: II - A patient with                            mild systemic disease. After reviewing the risks                            and benefits, the patient was deemed in                            satisfactory condition to undergo the procedure.                           After obtaining informed consent, the colonoscope  was passed under direct vision. Throughout the                            procedure, the patient's blood pressure, pulse, and                            oxygen saturations were monitored continuously. The                            Model CF-HQ190L [id number]) scope was introduced                            through the anus and advanced to the the cecum,                            identified by appendiceal orifice and ileocecal                      valve. The colonoscopy was performed without                            difficulty. The patient tolerated the procedure                            well. The quality of the bowel preparation was                            good. The ileocecal valve, appendiceal orifice, and                            rectum were photographed. Scope In: 2:15:49 PM Scope Out: 2:29:53 PM Scope Withdrawal Time: 0 hours 7 minutes 14 seconds  Total Procedure Duration: 0 hours 14 minutes 4 seconds  Findings:                 The perianal and digital rectal examinations were                            normal.                           A 2 mm polyp was found in the cecum. The polyp was                            sessile. The polyp was removed with a cold biopsy                            forceps. Resection and retrieval were complete.                           The exam was otherwise without abnormality on                            direct and retroflexion views. Complications:            No immediate  complications. Estimated Blood Loss:     Estimated blood loss: none. Impression:               - One 2 mm polyp in the cecum, removed with a cold                            biopsy forceps. Resected and retrieved.                           - The examination was otherwise normal on direct                            and retroflexion views. Recommendation:           - Patient has a contact number available for                            emergencies. The signs and symptoms of potential                            delayed complications were discussed with the                            patient. Return to normal activities tomorrow.                            Written discharge instructions were provided to the                            patient.                           - Resume previous diet.                           - Continue present medications.                           - Await pathology results.                            - No repeat colonoscopy due to age. Trenell Moxey L. Loletha Carrow, MD 10/31/2015 2:34:38 PM This report has been signed electronically.

## 2015-10-31 NOTE — Progress Notes (Signed)
Patient stating compliance to prep. Results last night clear. This morning results brownish cloudy liquid, patient stating she can see the bottom of the toilet. Reported results to Dr. Loletha Carrow.

## 2015-10-31 NOTE — Progress Notes (Signed)
Called to room to assist during endoscopic procedure.  Patient ID and intended procedure confirmed with present staff. Received instructions for my participation in the procedure from the performing physician.  

## 2015-10-31 NOTE — Patient Instructions (Signed)
YOU HAD AN ENDOSCOPIC PROCEDURE TODAY AT [facility]:   Refer to the procedure report that was given to you for any specific questions about what was found during the examination.  If the procedure report does not answer your questions, please call your gastroenterologist to clarify.  If you requested that your care partner not be given the details of your procedure findings, then the procedure report has been included in a sealed envelope for you to review at your convenience later.  YOU SHOULD EXPECT: Some feelings of bloating in the abdomen. Passage of more gas than usual.  Walking can help get rid of the air that was put into your GI tract during the procedure and reduce the bloating. If you had a lower endoscopy (such as a colonoscopy or flexible sigmoidoscopy) you may notice spotting of blood in your stool or on the toilet paper. If you underwent a bowel prep for your procedure, you may not have a normal bowel movement for a few days.  Please Note:  You might notice some irritation and congestion in your nose or some drainage.  This is from the oxygen used during your procedure.  There is no need for concern and it should clear up in a day or so.  SYMPTOMS TO REPORT IMMEDIATELY:   Following lower endoscopy (colonoscopy or flexible sigmoidoscopy):  Excessive amounts of blood in the stool  Significant tenderness or worsening of abdominal pains  Swelling of the abdomen that is new, acute  Fever of 100F or higher   For urgent or emergent issues, a gastroenterologist can be reached at any hour by calling [phone].   DIET: Your first meal following the procedure should be a small meal and then it is ok to progress to your normal diet. Heavy or fried foods are harder to digest and may make you feel nauseous or bloated.  Likewise, meals heavy in dairy and vegetables can increase bloating.  Drink plenty of fluids but you should avoid alcoholic beverages for 24  hours.  ACTIVITY:  You should plan to take it easy for the rest of today and you should NOT DRIVE or use heavy machinery until tomorrow (because of the sedation medicines used during the test).    FOLLOW UP: Our staff will call the number listed on your records the next business day following your procedure to check on you and address any questions or concerns that you may have regarding the information given to you following your procedure. If we do not reach you, we will leave a message.  However, if you are feeling well and you are not experiencing any problems, there is no need to return our call.  We will assume that you have returned to your regular daily activities without incident.  If any biopsies were taken you will be contacted by phone or by letter within the next 1-3 weeks.  Please call us at [phone] if you have not heard about the biopsies in 3 weeks.    SIGNATURES/CONFIDENTIALITY: You and/or your care partner have signed paperwork which will be entered into your electronic medical record.  These signatures attest to the fact that that the information above on your After Visit Summary has been reviewed and is understood.  Full responsibility of the confidentiality of this discharge information lies with you and/or your care-partner.   AWAIT PATHOLOGY RESULT  RESUME USUAL DIET AND MEDICATIONS

## 2015-10-31 NOTE — Progress Notes (Signed)
A and Ox 3 Report to RN 

## 2015-11-01 NOTE — Telephone Encounter (Signed)
  Follow up Call-  Call back number 10/31/2015  Post procedure Call Back phone  # [phone]  Permission to leave phone message No     Patient questions:  Do you have a fever, pain , or abdominal swelling? No. Pain Score  0 *  Have you tolerated food without any problems? Yes.    Have you been able to return to your normal activities? Yes.    Do you have any questions about your discharge instructions: Diet   No. Medications  No. Follow up visit  No.  Do you have questions or concerns about your Care? No.  Actions: * If pain score is 4 or above: No action needed, pain <4.

## 2015-11-06 ENCOUNTER — Encounter: Payer: Self-pay | Admitting: Gastroenterology

## 2015-11-26 DIAGNOSIS — R030 Elevated blood-pressure reading, without diagnosis of hypertension: Secondary | ICD-10-CM | POA: Diagnosis not present

## 2015-11-26 DIAGNOSIS — R05 Cough: Secondary | ICD-10-CM | POA: Diagnosis not present

## 2015-11-26 DIAGNOSIS — Z6827 Body mass index (BMI) 27.0-27.9, adult: Secondary | ICD-10-CM | POA: Diagnosis not present

## 2016-01-15 DIAGNOSIS — R600 Localized edema: Secondary | ICD-10-CM | POA: Diagnosis not present

## 2016-01-15 DIAGNOSIS — Z6826 Body mass index (BMI) 26.0-26.9, adult: Secondary | ICD-10-CM | POA: Diagnosis not present

## 2016-01-15 DIAGNOSIS — M79602 Pain in left arm: Secondary | ICD-10-CM | POA: Diagnosis not present

## 2016-01-15 DIAGNOSIS — M799 Soft tissue disorder, unspecified: Secondary | ICD-10-CM | POA: Diagnosis not present

## 2016-01-16 DIAGNOSIS — M25522 Pain in left elbow: Secondary | ICD-10-CM | POA: Diagnosis not present

## 2016-03-05 ENCOUNTER — Emergency Department (HOSPITAL_COMMUNITY): Payer: Medicare Other

## 2016-03-05 ENCOUNTER — Emergency Department (HOSPITAL_COMMUNITY)
Admission: EM | Admit: 2016-03-05 | Discharge: 2016-03-05 | Disposition: A | Discharge status: Home / Self Care | Payer: Medicare Other | Attending: Emergency Medicine | Admitting: Emergency Medicine

## 2016-03-05 ENCOUNTER — Encounter (HOSPITAL_COMMUNITY): Payer: Self-pay | Admitting: *Deleted

## 2016-03-05 DIAGNOSIS — R112 Nausea with vomiting, unspecified: Secondary | ICD-10-CM | POA: Insufficient documentation

## 2016-03-05 DIAGNOSIS — Z79899 Other long term (current) drug therapy: Secondary | ICD-10-CM | POA: Insufficient documentation

## 2016-03-05 DIAGNOSIS — R42 Dizziness and giddiness: Secondary | ICD-10-CM

## 2016-03-05 DIAGNOSIS — R404 Transient alteration of awareness: Secondary | ICD-10-CM | POA: Diagnosis not present

## 2016-03-05 DIAGNOSIS — Z791 Long term (current) use of non-steroidal anti-inflammatories (NSAID): Secondary | ICD-10-CM | POA: Insufficient documentation

## 2016-03-05 LAB — CBC WITH DIFFERENTIAL/PLATELET
BASOS ABS: 0 10*3/uL (ref 0.0–0.1)
Basophils Relative: 1 %
EOS PCT: 1 %
Eosinophils Absolute: 0.1 10*3/uL (ref 0.0–0.7)
HCT: 37.7 % (ref 36.0–46.0)
Hemoglobin: 12.6 g/dL (ref 12.0–15.0)
LYMPHS PCT: 25 %
Lymphs Abs: 1.5 10*3/uL (ref 0.7–4.0)
MCH: 29.3 pg (ref 26.0–34.0)
MCHC: 33.4 g/dL (ref 30.0–36.0)
MCV: 87.7 fL (ref 78.0–100.0)
MONO ABS: 0.2 10*3/uL (ref 0.1–1.0)
MONOS PCT: 4 %
Neutro Abs: 4.4 10*3/uL (ref 1.7–7.7)
Neutrophils Relative %: 69 %
PLATELETS: 241 10*3/uL (ref 150–400)
RBC: 4.3 MIL/uL (ref 3.87–5.11)
RDW: 15.6 % — AB (ref 11.5–15.5)
WBC: 6.3 10*3/uL (ref 4.0–10.5)

## 2016-03-05 LAB — COMPREHENSIVE METABOLIC PANEL
ALT: 20 U/L (ref 14–54)
AST: 40 U/L (ref 15–41)
Albumin: 4.5 g/dL (ref 3.5–5.0)
Alkaline Phosphatase: 39 U/L (ref 38–126)
Anion gap: 8 (ref 5–15)
BILIRUBIN TOTAL: 0.9 mg/dL (ref 0.3–1.2)
BUN: 19 mg/dL (ref 6–20)
CHLORIDE: 105 mmol/L (ref 101–111)
CO2: 26 mmol/L (ref 22–32)
CREATININE: 0.72 mg/dL (ref 0.44–1.00)
Calcium: 9.4 mg/dL (ref 8.9–10.3)
GFR calc Af Amer: >60 mL/min (ref >60)
Glucose, Bld: 117 mg/dL — ABNORMAL HIGH (ref 65–99)
Potassium: 4.3 mmol/L (ref 3.5–5.1)
Sodium: 139 mmol/L (ref 135–145)
Total Protein: 7.5 g/dL (ref 6.5–8.1)

## 2016-03-05 LAB — URINALYSIS, ROUTINE W REFLEX MICROSCOPIC
BILIRUBIN URINE: NEGATIVE
GLUCOSE, UA: NEGATIVE mg/dL
HGB URINE DIPSTICK: NEGATIVE
KETONES UR: 15 mg/dL — AB
LEUKOCYTES UA: NEGATIVE
Nitrite: NEGATIVE
Protein, ur: NEGATIVE mg/dL
Specific Gravity, Urine: 1.015 (ref 1.005–1.030)
pH: 7 (ref 5.0–8.0)

## 2016-03-05 LAB — TROPONIN I

## 2016-03-05 MED ORDER — MECLIZINE HCL 25 MG PO TABS
25.0000 mg | ORAL_TABLET | Freq: Once | ORAL | Status: AC
  Administered 2016-03-05: 25 mg via ORAL
  Filled 2016-03-05: qty 1

## 2016-03-05 MED ORDER — ONDANSETRON HCL 4 MG PO TABS: 4.0000 mg | ORAL_TABLET | Freq: Four times a day (QID) | ORAL | 0 refills | Status: DC

## 2016-03-05 MED ORDER — MECLIZINE HCL 25 MG PO TABS: 25.0000 mg | ORAL_TABLET | Freq: Three times a day (TID) | ORAL | 0 refills | Status: DC | PRN

## 2016-03-05 MED ORDER — PROMETHAZINE HCL 25 MG/ML IJ SOLN
12.5000 mg | Freq: Once | INTRAMUSCULAR | Status: AC
  Administered 2016-03-05: 12.5 mg via INTRAVENOUS
  Filled 2016-03-05: qty 1

## 2016-03-05 MED ORDER — SODIUM CHLORIDE 0.9 % IV BOLUS (SEPSIS)
1000.0000 mL | Freq: Once | INTRAVENOUS | Status: AC
  Administered 2016-03-05: 1000 mL via INTRAVENOUS

## 2016-03-05 NOTE — ED Notes (Signed)
Patient transported to CT 

## 2016-03-05 NOTE — ED Notes (Signed)
Bed: [id number] Expected date:  Expected time:  Means of arrival:  Comments: EMS-dizziness

## 2016-03-05 NOTE — ED Triage Notes (Signed)
Per EMS complains of vertigo/nausea since last night. Pt given 4mg  zofran.

## 2016-03-05 NOTE — ED Provider Notes (Signed)
[facility] DEPT Provider Note   CSN: [id number] Arrival date & time: 03/05/16  1620     History   Chief Complaint Chief Complaint  Patient presents with  . Dizziness    HPI Pamela Blair is a 75 y.o. female.  Pt has had vertigo since around 2:15 am.  The pt said that she has not improved today, so she called EMS today.  EMS gave her 4 mg of zofran en route which did help, but she is still dizzy.      Past Medical History:  Diagnosis Date  . Blood transfusion without reported diagnosis   . Cervical spinal stenosis   . Cervical spondylosis   . Hyperlipidemia   . Osteoporosis     Patient Active Problem List   Diagnosis Date Noted  . Chest wall contusion 07/02/2015  . Chest pain 02/01/2015  . DOE (dyspnea on exertion) 02/01/2015  . Abnormal CAT scan 09/12/2010    Class: Diagnosis of  . Hypopotassemia 09/12/2010    Class: Diagnosis of  . Other abnormal glucose 09/12/2010    Class: Diagnosis of  . LACTOSE INTOLERANCE 10/30/2008  . Vitamin D deficiency 10/18/2008  . HYPERLIPIDEMIA 02/08/2008  . DEPRESSION 02/08/2008  . UNSPECIFIED ARTHROPATHY MULTIPLE SITES 02/08/2008  . Osteoporosis 02/08/2008  . FATIGUE 02/08/2008  . NONSPECIFIC ABNORMAL ELECTROCARDIOGRAM 02/08/2008  . CARPAL TUNNEL SYNDROME 03/14/2007  . SPONDYLOSIS, CERVICAL 03/14/2007  . 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 ABDOMINAL HYSTERECTOMY     and BSO, dx- dysfunctional bleeding  . ANTERIOR CERVICAL DECOMP/DISCECTOMY FUSION  10/3-10/2010   Dr Pamala Hurry , [place] , [place]   . CESAREAN SECTION     x 4  . COLONOSCOPY     Dr Deatra Ina  . 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 TOTAL KNEE ARTHROPLASTY      OB History    No data available       Home Medications    Prior to Admission medications   Medication Sig Start Date End Date Taking? Authorizing Provider  buPROPion (WELLBUTRIN SR) 150 MG 12  hr tablet Take 150 mg by mouth daily.    Yes Historical Provider, MD  buPROPion (WELLBUTRIN XL) 150 MG 24 hr tablet Take 150 mg by mouth daily.   Yes Historical Provider, MD  Cholecalciferol (VITAMIN D3) 2000 UNITS capsule Take 1 capsule (2,000 Units total) by mouth daily. 07/02/15  Yes Evie Lacks Plotnikov, MD  diclofenac sodium (VOLTAREN) 1 % GEL Apply 1 application topically 3 (three) times daily as needed for pain. 12/09/15  Yes Historical Provider, MD  meloxicam (MOBIC) 15 MG tablet Take 15 mg by mouth daily as needed for pain.   Yes Historical Provider, MD  pravastatin (PRAVACHOL) 20 MG tablet Take 1 tablet (20 mg total) by mouth daily. Patient taking differently: Take 20 mg by mouth every evening.  09/03/14  Yes Hendricks Limes, MD  traMADol (ULTRAM) 50 MG tablet Take 1 tablet (50 mg total) by mouth every 8 (eight) hours as needed. 07/02/15  Yes Evie Lacks Plotnikov, MD  ZETIA 10 MG tablet Take 10 mg by mouth daily. 10/13/15  Yes Historical Provider, MD  meclizine (ANTIVERT) 25 MG tablet Take 1 tablet (25 mg total) by mouth 3 (three) times daily as needed for dizziness. 03/05/16   Isla Pence, MD  ondansetron (ZOFRAN) 4 MG tablet Take 1 tablet (4 mg total) by mouth every  6 (six) hours. 03/05/16   Isla Pence, MD  polyethylene glycol powder (GLYCOLAX/MIRALAX) powder Take 255 g by mouth daily. Patient not taking: Reported on 10/31/2015 10/29/15   Doran Stabler, MD    Family History Family History  Problem Relation Age of Onset  . Stroke Mother 63  . Heart attack Father 80  . Diabetes Neg Hx   . Cancer Neg Hx     Social History Social History  Substance Use Topics  . Smoking status: Never Smoker  . Smokeless tobacco: Never Used  . Alcohol use No     Allergies   Niacin; Clarithromycin; and Neomycin   Review of Systems Review of Systems  Gastrointestinal: Positive for nausea and vomiting.  Neurological: Positive for dizziness.  All other systems reviewed and are  negative.    Physical Exam Updated Vital Signs BP 136/69 (BP Location: Left Arm)   Pulse 68   Temp 97.9 F (36.6 C) (Oral)   Resp 18   SpO2 100%   Physical Exam  Constitutional: She is oriented to person, place, and time. She appears well-developed and well-nourished.  HENT:  Head: Normocephalic and atraumatic.  Right Ear: External ear normal.  Left Ear: External ear normal.  Nose: Nose normal.  Mouth/Throat: Oropharynx is clear and moist.  Eyes: Conjunctivae and EOM are normal. Pupils are equal, round, and reactive to light.  Neck: Normal range of motion. Neck supple.  Cardiovascular: Normal rate, regular rhythm, normal heart sounds and intact distal pulses.   Pulmonary/Chest: Effort normal and breath sounds normal.  Abdominal: Soft. Bowel sounds are normal.  Musculoskeletal: Normal range of motion.  Neurological: She is alert and oriented to person, place, and time.  Pt dizzy with eye and head mvmt.  Skin: Skin is warm and dry.  Psychiatric: She has a normal mood and affect. Her behavior is normal. Judgment and thought content normal.  Nursing note and vitals reviewed.    ED Treatments / Results  Labs (all labs ordered are listed, but only abnormal results are displayed) Labs Reviewed  COMPREHENSIVE METABOLIC PANEL - Abnormal; Notable for the following:       Result Value   Glucose, Bld 117 (*)    All other components within normal limits  CBC WITH DIFFERENTIAL/PLATELET - Abnormal; Notable for the following:    RDW 15.6 (*)    All other components within normal limits  URINALYSIS, ROUTINE W REFLEX MICROSCOPIC (NOT AT [facility]) - Abnormal; Notable for the following:    Ketones, ur 15 (*)    All other components within normal limits  TROPONIN I    EKG  EKG Interpretation None       Radiology Ct Head Wo Contrast  Result Date: 03/05/2016 CLINICAL DATA:  Vertigo and nausea since last night. EXAM: CT HEAD WITHOUT CONTRAST TECHNIQUE: Contiguous axial images were  obtained from the base of the skull through the vertex without intravenous contrast. COMPARISON:  None. FINDINGS: Brain: The ventricles are normal in configuration. There is ventricular and sulcal enlargement reflecting mild generalized atrophy. No hydrocephalus. There are no parenchymal masses or mass effect. There is no evidence of an infarct. There is minor periventricular white matter hypoattenuation consistent with chronic microvascular ischemic change. There is a densely calcified 9 mm pleural-based mass hot just to the left of midline at the vertex consistent with a small meningioma. No other extra-axial abnormality. There is no intracranial hemorrhage. Vascular: No hyperdense vessel or unexpected calcification. Skull: No skull lesion. Sinuses/Orbits: Globes and orbits  are unremarkable. Visualized sinuses are clear. Other: Clear mastoid air cells. IMPRESSION: 1. No acute intracranial abnormalities. 2. Mild atrophy and minor chronic microvascular ischemic change. 3. 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 Electronically Signed   By: Lajean Manes M.D.   On: 03/05/2016 17:12    Procedures Procedures (including critical care time)  Medications Ordered in ED Medications  sodium chloride 0.9 % bolus 1,000 mL (1,000 mLs Intravenous New Bag/Given 03/05/16 1719)  promethazine (PHENERGAN) injection 12.5 mg (12.5 mg Intravenous Given 03/05/16 1719)  meclizine (ANTIVERT) tablet 25 mg (25 mg Oral Given 03/05/16 1719)     Initial Impression / Assessment and Plan / ED Course  I have reviewed the triage vital signs and the nursing notes.  Pertinent labs & imaging results that were available during my care of the patient were reviewed by me and considered in my medical decision making (see chart for details).  Clinical Course   Pt is feeling much better. She is able to ambulate.  She knows to return if worse.  Final Clinical Impressions(s) / ED Diagnoses   Final diagnoses:  Vertigo    New  Prescriptions New Prescriptions   MECLIZINE (ANTIVERT) 25 MG TABLET    Take 1 tablet (25 mg total) by mouth 3 (three) times daily as needed for dizziness.   ONDANSETRON (ZOFRAN) 4 MG TABLET    Take 1 tablet (4 mg total) by mouth every 6 (six) hours.     Isla Pence, MD 03/05/16 [phone]

## 2016-03-10 DIAGNOSIS — H8110 Benign paroxysmal vertigo, unspecified ear: Secondary | ICD-10-CM | POA: Diagnosis not present

## 2016-03-10 DIAGNOSIS — D329 Benign neoplasm of meninges, unspecified: Secondary | ICD-10-CM | POA: Diagnosis not present

## 2016-03-10 DIAGNOSIS — Z6827 Body mass index (BMI) 27.0-27.9, adult: Secondary | ICD-10-CM | POA: Diagnosis not present

## 2016-03-10 DIAGNOSIS — E784 Other hyperlipidemia: Secondary | ICD-10-CM | POA: Diagnosis not present

## 2016-03-10 DIAGNOSIS — I700 Atherosclerosis of aorta: Secondary | ICD-10-CM | POA: Diagnosis not present

## 2016-03-16 ENCOUNTER — Other Ambulatory Visit: Payer: Self-pay | Admitting: Internal Medicine

## 2016-03-16 DIAGNOSIS — IMO0002 Reserved for concepts with insufficient information to code with codable children: Secondary | ICD-10-CM

## 2016-03-16 DIAGNOSIS — D329 Benign neoplasm of meninges, unspecified: Secondary | ICD-10-CM

## 2016-03-24 ENCOUNTER — Ambulatory Visit
Admission: RE | Admit: 2016-03-24 | Discharge: 2016-03-24 | Disposition: A | Discharge status: Home / Self Care | Payer: Medicare Other | Source: Ambulatory Visit | Attending: Internal Medicine | Admitting: Internal Medicine

## 2016-03-24 DIAGNOSIS — H524 Presbyopia: Secondary | ICD-10-CM | POA: Diagnosis not present

## 2016-03-24 DIAGNOSIS — H25013 Cortical age-related cataract, bilateral: Secondary | ICD-10-CM | POA: Diagnosis not present

## 2016-03-24 DIAGNOSIS — IMO0002 Reserved for concepts with insufficient information to code with codable children: Secondary | ICD-10-CM

## 2016-03-24 DIAGNOSIS — D329 Benign neoplasm of meninges, unspecified: Secondary | ICD-10-CM | POA: Diagnosis not present

## 2016-03-24 DIAGNOSIS — R42 Dizziness and giddiness: Secondary | ICD-10-CM | POA: Diagnosis not present

## 2016-03-24 DIAGNOSIS — H2513 Age-related nuclear cataract, bilateral: Secondary | ICD-10-CM | POA: Diagnosis not present

## 2016-03-24 DIAGNOSIS — H04123 Dry eye syndrome of bilateral lacrimal glands: Secondary | ICD-10-CM | POA: Diagnosis not present

## 2016-03-24 MED ORDER — GADOBENATE DIMEGLUMINE 529 MG/ML IV SOLN
10.0000 mL | Freq: Once | INTRAVENOUS | Status: AC | PRN
  Administered 2016-03-24: 10 mL via INTRAVENOUS

## 2016-04-15 DIAGNOSIS — R5383 Other fatigue: Secondary | ICD-10-CM | POA: Diagnosis not present

## 2016-04-15 DIAGNOSIS — Z79899 Other long term (current) drug therapy: Secondary | ICD-10-CM | POA: Diagnosis not present

## 2016-04-15 DIAGNOSIS — Z23 Encounter for immunization: Secondary | ICD-10-CM | POA: Diagnosis not present

## 2016-04-15 DIAGNOSIS — R42 Dizziness and giddiness: Secondary | ICD-10-CM | POA: Diagnosis not present

## 2016-04-15 DIAGNOSIS — D485 Neoplasm of uncertain behavior of skin: Secondary | ICD-10-CM | POA: Diagnosis not present

## 2016-04-15 DIAGNOSIS — Z0000 Encounter for general adult medical examination without abnormal findings: Secondary | ICD-10-CM | POA: Diagnosis not present

## 2016-04-15 DIAGNOSIS — H9113 Presbycusis, bilateral: Secondary | ICD-10-CM | POA: Diagnosis not present

## 2016-04-15 DIAGNOSIS — E785 Hyperlipidemia, unspecified: Secondary | ICD-10-CM | POA: Diagnosis not present

## 2016-04-15 DIAGNOSIS — Z1389 Encounter for screening for other disorder: Secondary | ICD-10-CM | POA: Diagnosis not present

## 2016-04-15 DIAGNOSIS — Z131 Encounter for screening for diabetes mellitus: Secondary | ICD-10-CM | POA: Diagnosis not present

## 2016-04-15 DIAGNOSIS — H8149 Vertigo of central origin, unspecified ear: Secondary | ICD-10-CM | POA: Diagnosis not present

## 2016-04-15 DIAGNOSIS — I70219 Atherosclerosis of native arteries of extremities with intermittent claudication, unspecified extremity: Secondary | ICD-10-CM | POA: Diagnosis not present

## 2016-04-15 DIAGNOSIS — E039 Hypothyroidism, unspecified: Secondary | ICD-10-CM | POA: Diagnosis not present

## 2016-04-15 DIAGNOSIS — E7800 Pure hypercholesterolemia, unspecified: Secondary | ICD-10-CM | POA: Diagnosis not present

## 2016-04-15 DIAGNOSIS — R002 Palpitations: Secondary | ICD-10-CM | POA: Diagnosis not present

## 2016-04-15 DIAGNOSIS — R1032 Left lower quadrant pain: Secondary | ICD-10-CM | POA: Diagnosis not present

## 2016-04-16 DIAGNOSIS — E785 Hyperlipidemia, unspecified: Secondary | ICD-10-CM | POA: Diagnosis not present

## 2016-06-22 DIAGNOSIS — S46211A Strain of muscle, fascia and tendon of other parts of biceps, right arm, initial encounter: Secondary | ICD-10-CM | POA: Diagnosis not present

## 2016-06-22 DIAGNOSIS — M12811 Other specific arthropathies, not elsewhere classified, right shoulder: Secondary | ICD-10-CM | POA: Diagnosis not present

## 2016-06-22 DIAGNOSIS — M7061 Trochanteric bursitis, right hip: Secondary | ICD-10-CM | POA: Diagnosis not present

## 2016-06-22 DIAGNOSIS — M7581 Other shoulder lesions, right shoulder: Secondary | ICD-10-CM | POA: Diagnosis not present

## 2016-10-08 ENCOUNTER — Telehealth: Payer: Self-pay | Admitting: Internal Medicine

## 2016-10-08 NOTE — Telephone Encounter (Signed)
Called patient. No answer. No VM set up. Will try again later. Thank you.

## 2016-10-08 NOTE — Telephone Encounter (Signed)
Patient not seen since 06/2015, please help schedule appointment so we can refill medication. Thank you!

## 2016-10-15 DIAGNOSIS — M7061 Trochanteric bursitis, right hip: Secondary | ICD-10-CM | POA: Diagnosis not present

## 2016-10-15 DIAGNOSIS — Z96651 Presence of right artificial knee joint: Secondary | ICD-10-CM | POA: Diagnosis not present

## 2016-10-18 IMAGING — CT CT HEAD W/O CM
3 of 4 series · 15 of 47 positions shown, 18 images · non-contrast
Comparison: None.

CLINICAL DATA: Vertigo and nausea since last night.

EXAM:
CT HEAD WITHOUT CONTRAST
TECHNIQUE: Contiguous axial images were obtained from the base of the skull
through the vertex without intravenous contrast.

[Series 2: head w/o · axial · non-contrast · 0.39mm/px · z∈[+1344,+1474]mm · 9 of 32 slices shown, 12 images]
[im 3/32  brain]
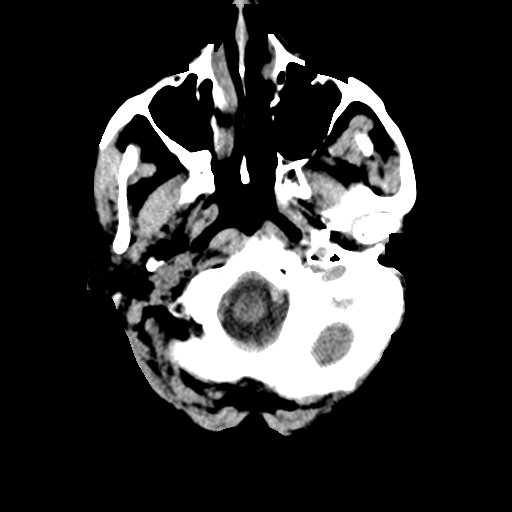
[im 3/32  bone]
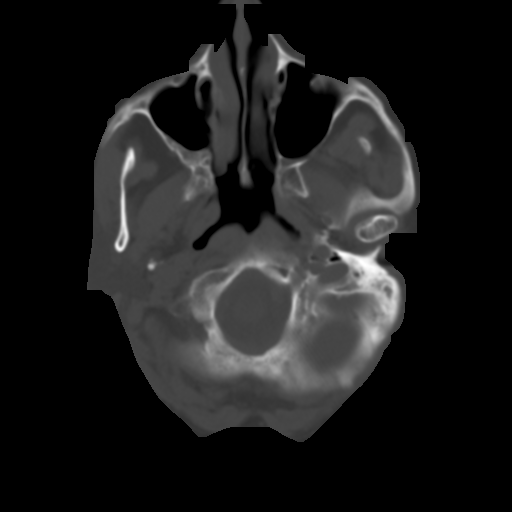
[im 7/32  brain]
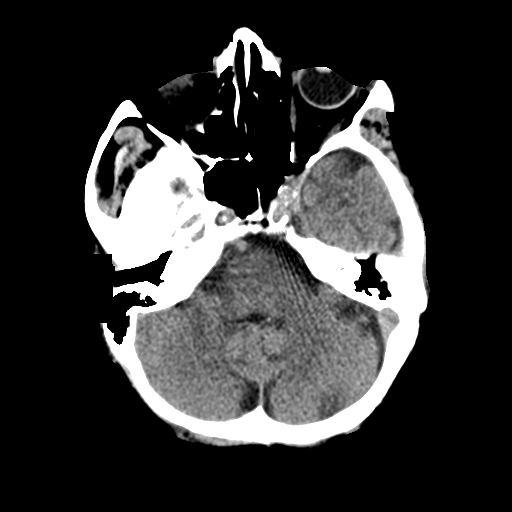
[im 9/32  brain]
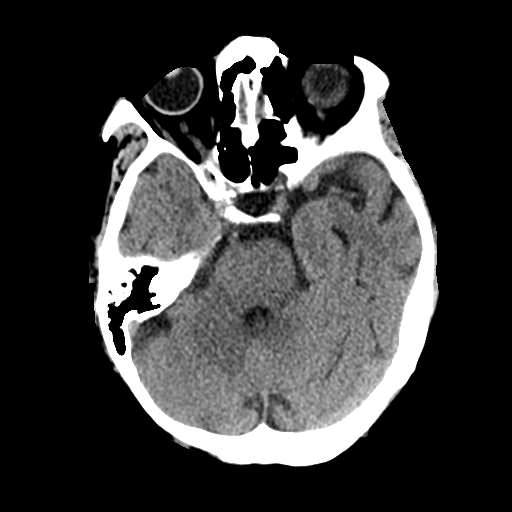
[im 14/32  brain]
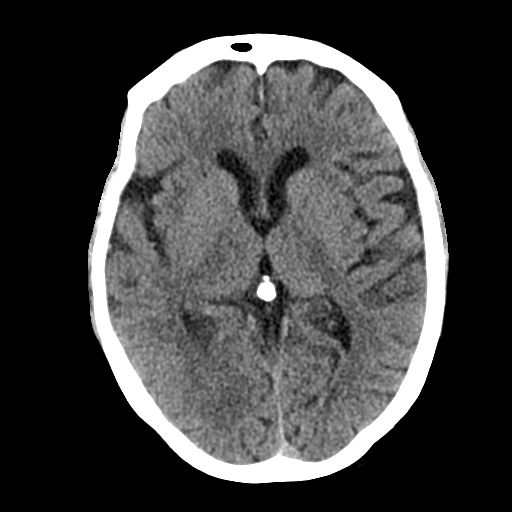
[im 16/32  brain]
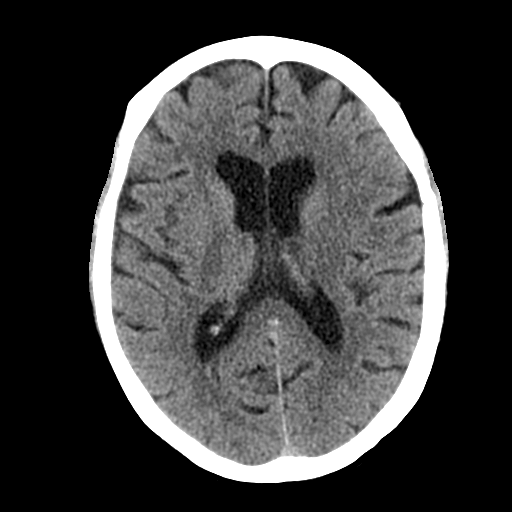
[im 16/32  bone]
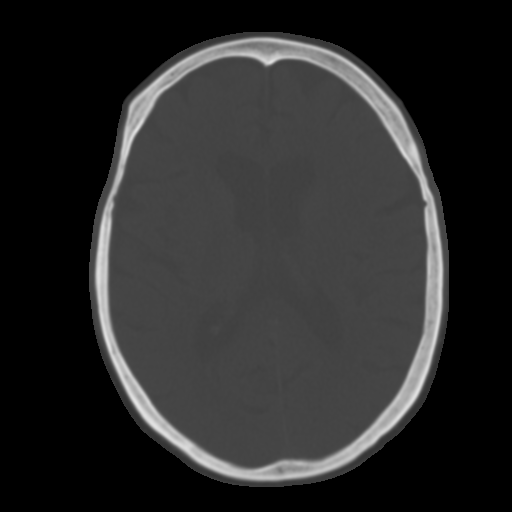
[im 18/32  brain]
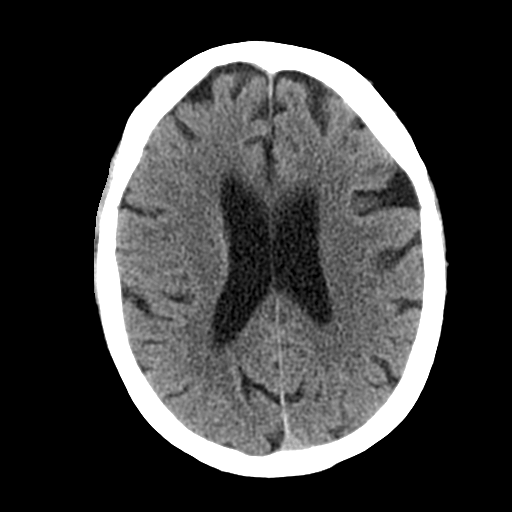
[im 23/32  brain]
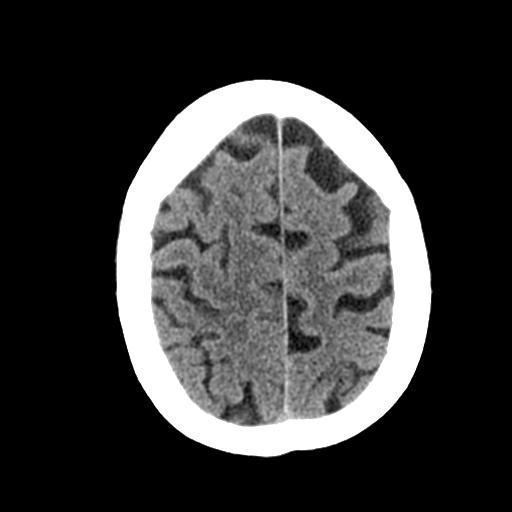
[im 25/32  brain]
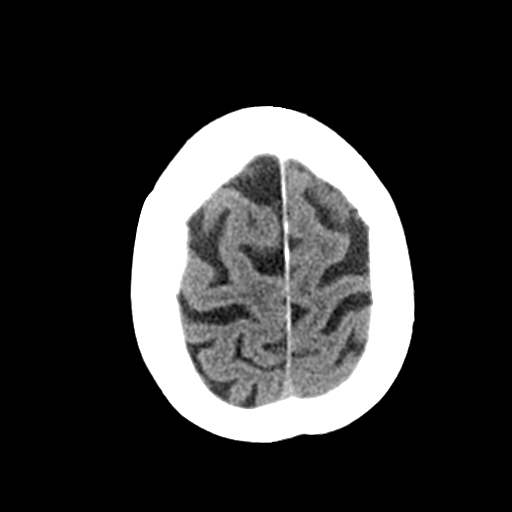
[im 29/32  brain]
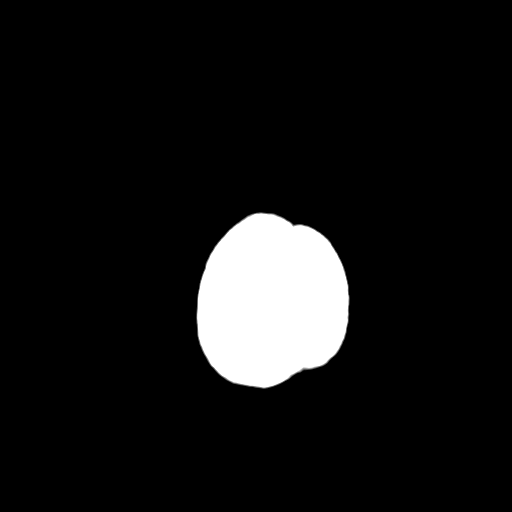
[im 29/32  bone]
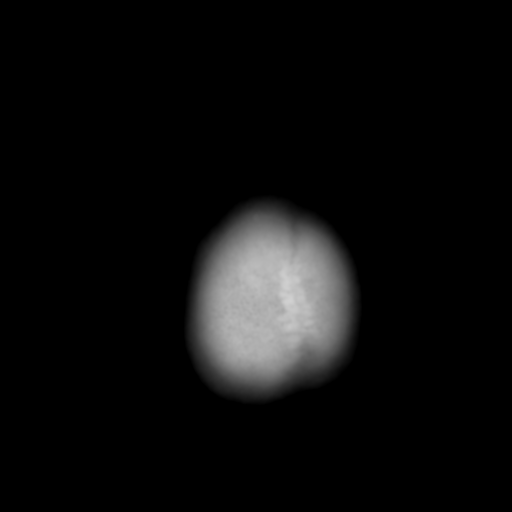

[Series 5: coronal · coronal · 0.28mm/px · 3 of 65 slices shown]
[im 22/65  brain]
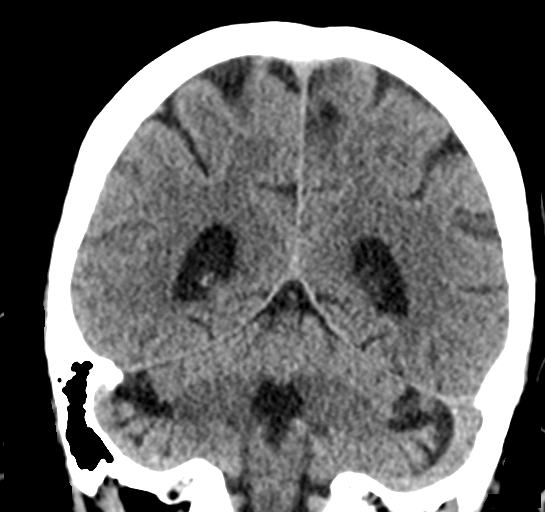
[im 29/65  brain]
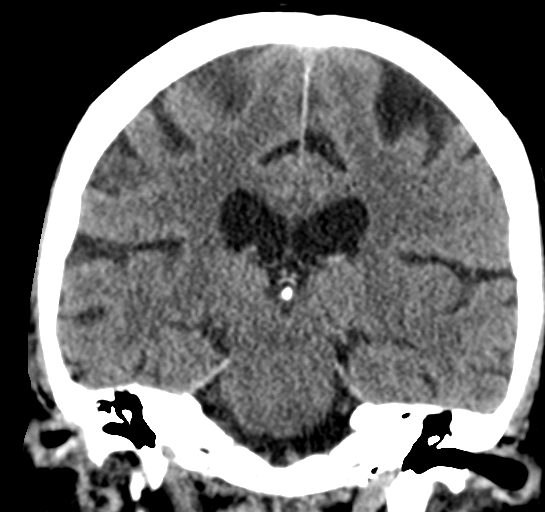
[im 36/65  brain]
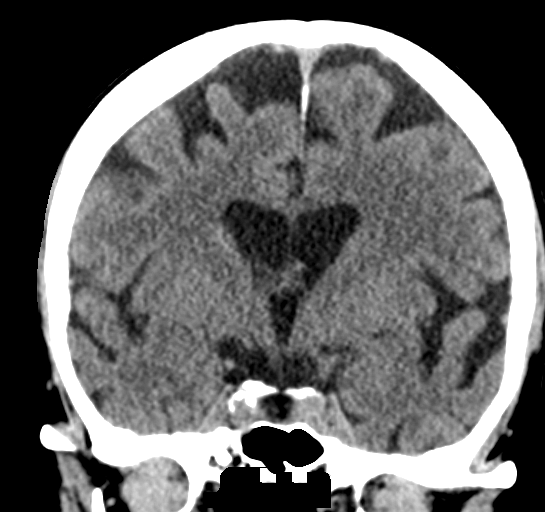

[Series 6: sagittal · sagittal · 0.30mm/px · 3 of 50 slices shown]
[im 17/50  brain]
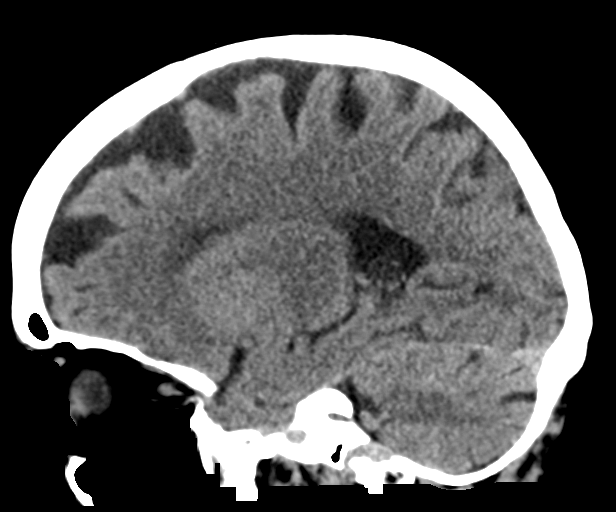
[im 25/50  brain]
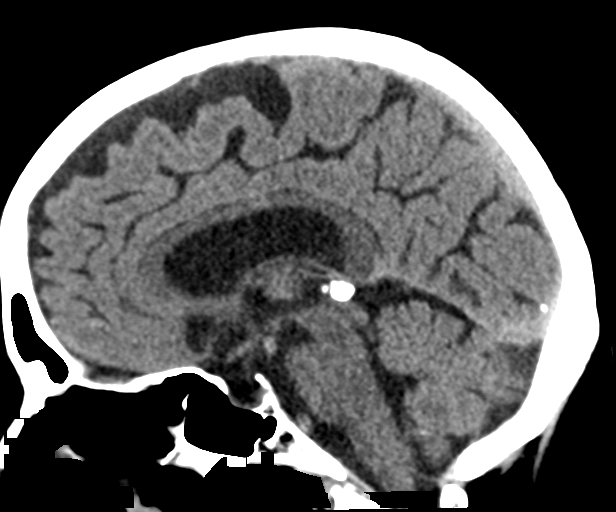
[im 33/50  brain]
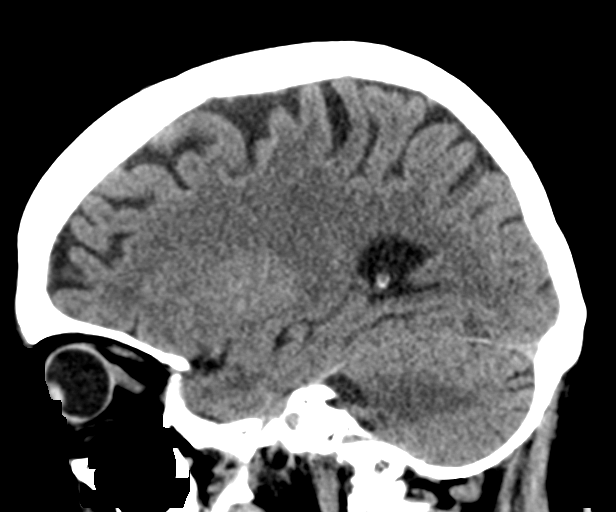

[15 of 47 positions shown; findings below may reference images not displayed]

FINDINGS: Brain: The ventricles are normal in configuration. There is
ventricular and sulcal enlargement reflecting mild generalized
atrophy. No hydrocephalus.

There are no parenchymal masses or mass effect. There is no evidence
of an infarct. There is minor periventricular white matter
hypoattenuation consistent with chronic microvascular ischemic
change.

There is a densely calcified 9 mm pleural-based mass hot just to the
left of midline at the vertex consistent with a small meningioma. No
other extra-axial abnormality.

There is no intracranial hemorrhage.

Vascular: No hyperdense vessel or unexpected calcification.

Skull: No skull lesion.

Sinuses/Orbits: Globes and orbits are unremarkable. Visualized
sinuses are clear.

Other: Clear mastoid air cells.
IMPRESSION: 1. No acute intracranial abnormalities.
2. Mild atrophy and minor chronic microvascular ischemic change.
3. Small left parasagittal meningioma at the vertex.
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
## 2016-11-27 DIAGNOSIS — Z01419 Encounter for gynecological examination (general) (routine) without abnormal findings: Secondary | ICD-10-CM | POA: Diagnosis not present

## 2016-11-27 DIAGNOSIS — R252 Cramp and spasm: Secondary | ICD-10-CM | POA: Diagnosis not present

## 2016-11-27 DIAGNOSIS — Z1272 Encounter for screening for malignant neoplasm of vagina: Secondary | ICD-10-CM | POA: Diagnosis not present

## 2016-11-27 DIAGNOSIS — R5383 Other fatigue: Secondary | ICD-10-CM | POA: Diagnosis not present

## 2016-11-27 DIAGNOSIS — Z124 Encounter for screening for malignant neoplasm of cervix: Secondary | ICD-10-CM | POA: Diagnosis not present

## 2016-11-27 DIAGNOSIS — E538 Deficiency of other specified B group vitamins: Secondary | ICD-10-CM | POA: Diagnosis not present

## 2016-11-27 DIAGNOSIS — Z1231 Encounter for screening mammogram for malignant neoplasm of breast: Secondary | ICD-10-CM | POA: Diagnosis not present

## 2016-11-27 DIAGNOSIS — Z6828 Body mass index (BMI) 28.0-28.9, adult: Secondary | ICD-10-CM | POA: Diagnosis not present

## 2016-12-02 DIAGNOSIS — M542 Cervicalgia: Secondary | ICD-10-CM | POA: Diagnosis not present

## 2016-12-02 DIAGNOSIS — Z6826 Body mass index (BMI) 26.0-26.9, adult: Secondary | ICD-10-CM | POA: Diagnosis not present

## 2016-12-31 ENCOUNTER — Other Ambulatory Visit (HOSPITAL_COMMUNITY): Payer: Self-pay | Admitting: Internal Medicine

## 2016-12-31 DIAGNOSIS — I6529 Occlusion and stenosis of unspecified carotid artery: Secondary | ICD-10-CM

## 2017-01-01 ENCOUNTER — Ambulatory Visit (HOSPITAL_COMMUNITY)
Admission: RE | Admit: 2017-01-01 | Discharge: 2017-01-01 | Disposition: A | Discharge status: Home / Self Care | Payer: Medicare Other | Source: Ambulatory Visit | Attending: Vascular Surgery | Admitting: Vascular Surgery

## 2017-01-01 DIAGNOSIS — I6523 Occlusion and stenosis of bilateral carotid arteries: Secondary | ICD-10-CM | POA: Insufficient documentation

## 2017-01-01 DIAGNOSIS — I6529 Occlusion and stenosis of unspecified carotid artery: Secondary | ICD-10-CM

## 2017-01-01 LAB — VAS US CAROTID
LCCADDIAS: 17 cm/s
LCCAPSYS: 66 cm/s
LEFT ECA DIAS: -12 cm/s
LEFT VERTEBRAL DIAS: 25 cm/s
LICADDIAS: -23 cm/s
Left CCA dist sys: 52 cm/s
Left CCA prox dias: 18 cm/s
Left ICA dist sys: -63 cm/s
Left ICA prox dias: 19 cm/s
Left ICA prox sys: 70 cm/s
RCCADSYS: -57 cm/s
RCCAPDIAS: 18 cm/s
RIGHT CCA MID DIAS: 15 cm/s
RIGHT ECA DIAS: 10 cm/s
RIGHT VERTEBRAL DIAS: -8 cm/s
Right CCA prox sys: 77 cm/s

## 2017-02-11 DIAGNOSIS — M7061 Trochanteric bursitis, right hip: Secondary | ICD-10-CM | POA: Diagnosis not present

## 2017-02-12 DIAGNOSIS — M12811 Other specific arthropathies, not elsewhere classified, right shoulder: Secondary | ICD-10-CM | POA: Diagnosis not present

## 2017-02-12 DIAGNOSIS — M7581 Other shoulder lesions, right shoulder: Secondary | ICD-10-CM | POA: Diagnosis not present

## 2017-02-12 DIAGNOSIS — G5601 Carpal tunnel syndrome, right upper limb: Secondary | ICD-10-CM | POA: Diagnosis not present

## 2017-03-26 DIAGNOSIS — H524 Presbyopia: Secondary | ICD-10-CM | POA: Diagnosis not present

## 2017-03-26 DIAGNOSIS — H43813 Vitreous degeneration, bilateral: Secondary | ICD-10-CM | POA: Diagnosis not present

## 2017-03-26 DIAGNOSIS — H40033 Anatomical narrow angle, bilateral: Secondary | ICD-10-CM | POA: Diagnosis not present

## 2017-03-26 DIAGNOSIS — H2513 Age-related nuclear cataract, bilateral: Secondary | ICD-10-CM | POA: Diagnosis not present

## 2017-04-19 DIAGNOSIS — D485 Neoplasm of uncertain behavior of skin: Secondary | ICD-10-CM | POA: Diagnosis not present

## 2017-04-19 DIAGNOSIS — Z0000 Encounter for general adult medical examination without abnormal findings: Secondary | ICD-10-CM | POA: Diagnosis not present

## 2017-04-19 DIAGNOSIS — R42 Dizziness and giddiness: Secondary | ICD-10-CM | POA: Diagnosis not present

## 2017-04-19 DIAGNOSIS — H9113 Presbycusis, bilateral: Secondary | ICD-10-CM | POA: Diagnosis not present

## 2017-04-19 DIAGNOSIS — R002 Palpitations: Secondary | ICD-10-CM | POA: Diagnosis not present

## 2017-04-19 DIAGNOSIS — Z23 Encounter for immunization: Secondary | ICD-10-CM | POA: Diagnosis not present

## 2017-04-19 DIAGNOSIS — R1032 Left lower quadrant pain: Secondary | ICD-10-CM | POA: Diagnosis not present

## 2017-04-19 DIAGNOSIS — E785 Hyperlipidemia, unspecified: Secondary | ICD-10-CM | POA: Diagnosis not present

## 2017-04-19 DIAGNOSIS — I70219 Atherosclerosis of native arteries of extremities with intermittent claudication, unspecified extremity: Secondary | ICD-10-CM | POA: Diagnosis not present

## 2017-04-19 DIAGNOSIS — Z1389 Encounter for screening for other disorder: Secondary | ICD-10-CM | POA: Diagnosis not present

## 2017-04-20 DIAGNOSIS — M6529 Calcific tendinitis, multiple sites: Secondary | ICD-10-CM | POA: Diagnosis not present

## 2017-04-20 DIAGNOSIS — E7800 Pure hypercholesterolemia, unspecified: Secondary | ICD-10-CM | POA: Diagnosis not present

## 2017-04-20 DIAGNOSIS — E039 Hypothyroidism, unspecified: Secondary | ICD-10-CM | POA: Diagnosis not present

## 2017-04-20 DIAGNOSIS — R5383 Other fatigue: Secondary | ICD-10-CM | POA: Diagnosis not present

## 2017-04-20 DIAGNOSIS — Z79899 Other long term (current) drug therapy: Secondary | ICD-10-CM | POA: Diagnosis not present

## 2017-04-20 DIAGNOSIS — Z131 Encounter for screening for diabetes mellitus: Secondary | ICD-10-CM | POA: Diagnosis not present

## 2017-04-26 DIAGNOSIS — E785 Hyperlipidemia, unspecified: Secondary | ICD-10-CM | POA: Diagnosis not present

## 2017-05-20 DIAGNOSIS — J209 Acute bronchitis, unspecified: Secondary | ICD-10-CM | POA: Diagnosis not present

## 2017-05-20 DIAGNOSIS — J029 Acute pharyngitis, unspecified: Secondary | ICD-10-CM | POA: Diagnosis not present

## 2017-06-21 DIAGNOSIS — M545 Low back pain: Secondary | ICD-10-CM | POA: Diagnosis not present

## 2017-06-21 DIAGNOSIS — M5416 Radiculopathy, lumbar region: Secondary | ICD-10-CM | POA: Diagnosis not present

## 2017-06-21 DIAGNOSIS — M7061 Trochanteric bursitis, right hip: Secondary | ICD-10-CM | POA: Diagnosis not present

## 2017-07-09 DIAGNOSIS — H93299 Other abnormal auditory perceptions, unspecified ear: Secondary | ICD-10-CM | POA: Diagnosis not present

## 2017-07-09 DIAGNOSIS — H903 Sensorineural hearing loss, bilateral: Secondary | ICD-10-CM | POA: Diagnosis not present

## 2017-07-09 DIAGNOSIS — H9193 Unspecified hearing loss, bilateral: Secondary | ICD-10-CM | POA: Diagnosis not present

## 2017-08-04 DIAGNOSIS — H40033 Anatomical narrow angle, bilateral: Secondary | ICD-10-CM | POA: Diagnosis not present

## 2017-08-04 DIAGNOSIS — H25813 Combined forms of age-related cataract, bilateral: Secondary | ICD-10-CM | POA: Diagnosis not present

## 2017-08-11 DIAGNOSIS — E785 Hyperlipidemia, unspecified: Secondary | ICD-10-CM | POA: Diagnosis not present

## 2017-08-11 DIAGNOSIS — M810 Age-related osteoporosis without current pathological fracture: Secondary | ICD-10-CM | POA: Diagnosis not present

## 2017-08-11 DIAGNOSIS — H2589 Other age-related cataract: Secondary | ICD-10-CM | POA: Insufficient documentation

## 2017-08-11 DIAGNOSIS — H25813 Combined forms of age-related cataract, bilateral: Secondary | ICD-10-CM | POA: Diagnosis not present

## 2017-08-11 DIAGNOSIS — F329 Major depressive disorder, single episode, unspecified: Secondary | ICD-10-CM | POA: Diagnosis not present

## 2017-08-11 DIAGNOSIS — M1990 Unspecified osteoarthritis, unspecified site: Secondary | ICD-10-CM | POA: Diagnosis not present

## 2017-08-11 DIAGNOSIS — H25811 Combined forms of age-related cataract, right eye: Secondary | ICD-10-CM | POA: Diagnosis not present

## 2017-08-11 DIAGNOSIS — F330 Major depressive disorder, recurrent, mild: Secondary | ICD-10-CM | POA: Diagnosis not present

## 2017-08-12 DIAGNOSIS — H40033 Anatomical narrow angle, bilateral: Secondary | ICD-10-CM | POA: Insufficient documentation

## 2017-08-18 DIAGNOSIS — Z9841 Cataract extraction status, right eye: Secondary | ICD-10-CM | POA: Diagnosis not present

## 2017-08-18 DIAGNOSIS — F329 Major depressive disorder, single episode, unspecified: Secondary | ICD-10-CM | POA: Diagnosis not present

## 2017-08-18 DIAGNOSIS — Z79899 Other long term (current) drug therapy: Secondary | ICD-10-CM | POA: Diagnosis not present

## 2017-08-18 DIAGNOSIS — H25812 Combined forms of age-related cataract, left eye: Secondary | ICD-10-CM | POA: Diagnosis not present

## 2017-08-18 DIAGNOSIS — Z961 Presence of intraocular lens: Secondary | ICD-10-CM | POA: Diagnosis not present

## 2017-08-18 DIAGNOSIS — E785 Hyperlipidemia, unspecified: Secondary | ICD-10-CM | POA: Diagnosis not present

## 2017-08-19 DIAGNOSIS — Z961 Presence of intraocular lens: Secondary | ICD-10-CM | POA: Insufficient documentation

## 2017-08-25 DIAGNOSIS — M6281 Muscle weakness (generalized): Secondary | ICD-10-CM | POA: Diagnosis not present

## 2017-08-25 DIAGNOSIS — Z96651 Presence of right artificial knee joint: Secondary | ICD-10-CM | POA: Diagnosis not present

## 2017-08-25 DIAGNOSIS — M20022 Boutonniere deformity of left finger(s): Secondary | ICD-10-CM | POA: Diagnosis not present

## 2017-08-25 DIAGNOSIS — M62542 Muscle wasting and atrophy, not elsewhere classified, left hand: Secondary | ICD-10-CM | POA: Diagnosis not present

## 2017-08-25 DIAGNOSIS — M25642 Stiffness of left hand, not elsewhere classified: Secondary | ICD-10-CM | POA: Diagnosis not present

## 2017-08-25 DIAGNOSIS — M79642 Pain in left hand: Secondary | ICD-10-CM | POA: Diagnosis not present

## 2017-08-25 DIAGNOSIS — M7989 Other specified soft tissue disorders: Secondary | ICD-10-CM | POA: Diagnosis not present

## 2017-09-01 DIAGNOSIS — M25642 Stiffness of left hand, not elsewhere classified: Secondary | ICD-10-CM | POA: Diagnosis not present

## 2017-09-01 DIAGNOSIS — M6281 Muscle weakness (generalized): Secondary | ICD-10-CM | POA: Diagnosis not present

## 2017-09-01 DIAGNOSIS — M62542 Muscle wasting and atrophy, not elsewhere classified, left hand: Secondary | ICD-10-CM | POA: Diagnosis not present

## 2017-09-01 DIAGNOSIS — M20022 Boutonniere deformity of left finger(s): Secondary | ICD-10-CM | POA: Diagnosis not present

## 2017-09-01 DIAGNOSIS — Z96651 Presence of right artificial knee joint: Secondary | ICD-10-CM | POA: Diagnosis not present

## 2017-11-10 DIAGNOSIS — M25552 Pain in left hip: Secondary | ICD-10-CM | POA: Diagnosis not present

## 2017-11-10 DIAGNOSIS — M25561 Pain in right knee: Secondary | ICD-10-CM | POA: Diagnosis not present

## 2017-11-10 DIAGNOSIS — M25551 Pain in right hip: Secondary | ICD-10-CM | POA: Diagnosis not present

## 2017-11-10 DIAGNOSIS — M5136 Other intervertebral disc degeneration, lumbar region: Secondary | ICD-10-CM | POA: Diagnosis not present

## 2017-11-10 DIAGNOSIS — Z96651 Presence of right artificial knee joint: Secondary | ICD-10-CM | POA: Diagnosis not present

## 2017-11-10 DIAGNOSIS — M47816 Spondylosis without myelopathy or radiculopathy, lumbar region: Secondary | ICD-10-CM | POA: Diagnosis not present

## 2017-11-10 DIAGNOSIS — M545 Low back pain: Secondary | ICD-10-CM | POA: Diagnosis not present

## 2017-11-12 DIAGNOSIS — G5601 Carpal tunnel syndrome, right upper limb: Secondary | ICD-10-CM | POA: Diagnosis not present

## 2017-11-12 DIAGNOSIS — R29898 Other symptoms and signs involving the musculoskeletal system: Secondary | ICD-10-CM | POA: Diagnosis not present

## 2017-11-12 DIAGNOSIS — M25512 Pain in left shoulder: Secondary | ICD-10-CM | POA: Diagnosis not present

## 2017-11-15 DIAGNOSIS — R82998 Other abnormal findings in urine: Secondary | ICD-10-CM | POA: Diagnosis not present

## 2017-11-15 DIAGNOSIS — F3289 Other specified depressive episodes: Secondary | ICD-10-CM | POA: Diagnosis not present

## 2017-11-15 DIAGNOSIS — E7849 Other hyperlipidemia: Secondary | ICD-10-CM | POA: Diagnosis not present

## 2017-11-15 DIAGNOSIS — M1990 Unspecified osteoarthritis, unspecified site: Secondary | ICD-10-CM | POA: Diagnosis not present

## 2017-11-15 DIAGNOSIS — R5383 Other fatigue: Secondary | ICD-10-CM | POA: Diagnosis not present

## 2017-11-15 DIAGNOSIS — Z6827 Body mass index (BMI) 27.0-27.9, adult: Secondary | ICD-10-CM | POA: Diagnosis not present

## 2017-11-15 DIAGNOSIS — M25519 Pain in unspecified shoulder: Secondary | ICD-10-CM | POA: Diagnosis not present

## 2017-11-15 DIAGNOSIS — M810 Age-related osteoporosis without current pathological fracture: Secondary | ICD-10-CM | POA: Diagnosis not present

## 2017-12-06 DIAGNOSIS — Z961 Presence of intraocular lens: Secondary | ICD-10-CM | POA: Diagnosis not present

## 2017-12-27 DIAGNOSIS — M4306 Spondylolysis, lumbar region: Secondary | ICD-10-CM | POA: Diagnosis not present

## 2017-12-27 DIAGNOSIS — D485 Neoplasm of uncertain behavior of skin: Secondary | ICD-10-CM | POA: Diagnosis not present

## 2018-01-26 DIAGNOSIS — Z6827 Body mass index (BMI) 27.0-27.9, adult: Secondary | ICD-10-CM | POA: Diagnosis not present

## 2018-01-26 DIAGNOSIS — M2550 Pain in unspecified joint: Secondary | ICD-10-CM | POA: Diagnosis not present

## 2018-01-26 DIAGNOSIS — M25512 Pain in left shoulder: Secondary | ICD-10-CM | POA: Diagnosis not present

## 2018-01-31 DIAGNOSIS — M7521 Bicipital tendinitis, right shoulder: Secondary | ICD-10-CM | POA: Diagnosis not present

## 2018-02-22 DIAGNOSIS — Z23 Encounter for immunization: Secondary | ICD-10-CM | POA: Diagnosis not present

## 2018-02-22 DIAGNOSIS — M545 Low back pain: Secondary | ICD-10-CM | POA: Diagnosis not present

## 2018-02-22 DIAGNOSIS — M2040 Other hammer toe(s) (acquired), unspecified foot: Secondary | ICD-10-CM | POA: Diagnosis not present

## 2018-03-24 DIAGNOSIS — M810 Age-related osteoporosis without current pathological fracture: Secondary | ICD-10-CM | POA: Diagnosis not present

## 2018-03-25 DIAGNOSIS — H8113 Benign paroxysmal vertigo, bilateral: Secondary | ICD-10-CM | POA: Diagnosis not present

## 2018-03-25 DIAGNOSIS — R5383 Other fatigue: Secondary | ICD-10-CM | POA: Diagnosis not present

## 2018-03-25 DIAGNOSIS — Z0000 Encounter for general adult medical examination without abnormal findings: Secondary | ICD-10-CM | POA: Diagnosis not present

## 2018-03-25 DIAGNOSIS — M25519 Pain in unspecified shoulder: Secondary | ICD-10-CM | POA: Diagnosis not present

## 2018-03-25 DIAGNOSIS — Z6830 Body mass index (BMI) 30.0-30.9, adult: Secondary | ICD-10-CM | POA: Diagnosis not present

## 2018-03-25 DIAGNOSIS — Z1389 Encounter for screening for other disorder: Secondary | ICD-10-CM | POA: Diagnosis not present

## 2018-03-25 DIAGNOSIS — M791 Myalgia, unspecified site: Secondary | ICD-10-CM | POA: Diagnosis not present

## 2018-03-25 DIAGNOSIS — F3289 Other specified depressive episodes: Secondary | ICD-10-CM | POA: Diagnosis not present

## 2018-03-25 DIAGNOSIS — E7849 Other hyperlipidemia: Secondary | ICD-10-CM | POA: Diagnosis not present

## 2018-03-25 DIAGNOSIS — Z23 Encounter for immunization: Secondary | ICD-10-CM | POA: Diagnosis not present

## 2018-03-25 DIAGNOSIS — D320 Benign neoplasm of cerebral meninges: Secondary | ICD-10-CM | POA: Diagnosis not present

## 2018-03-25 DIAGNOSIS — M542 Cervicalgia: Secondary | ICD-10-CM | POA: Diagnosis not present

## 2018-03-25 DIAGNOSIS — I6529 Occlusion and stenosis of unspecified carotid artery: Secondary | ICD-10-CM | POA: Diagnosis not present

## 2018-03-28 DIAGNOSIS — J209 Acute bronchitis, unspecified: Secondary | ICD-10-CM | POA: Diagnosis not present

## 2018-03-28 DIAGNOSIS — J029 Acute pharyngitis, unspecified: Secondary | ICD-10-CM | POA: Diagnosis not present

## 2018-03-30 ENCOUNTER — Other Ambulatory Visit: Payer: Self-pay | Admitting: Internal Medicine

## 2018-03-30 DIAGNOSIS — E785 Hyperlipidemia, unspecified: Secondary | ICD-10-CM

## 2018-04-05 ENCOUNTER — Other Ambulatory Visit: Payer: Self-pay | Admitting: Internal Medicine

## 2018-04-05 DIAGNOSIS — Z1231 Encounter for screening mammogram for malignant neoplasm of breast: Secondary | ICD-10-CM

## 2018-04-11 ENCOUNTER — Ambulatory Visit: Payer: Medicare Other

## 2018-04-12 ENCOUNTER — Ambulatory Visit
Admission: RE | Admit: 2018-04-12 | Discharge: 2018-04-12 | Disposition: A | Discharge status: Home / Self Care | Payer: Medicare Other | Source: Ambulatory Visit | Attending: Internal Medicine | Admitting: Internal Medicine

## 2018-04-12 DIAGNOSIS — Z1231 Encounter for screening mammogram for malignant neoplasm of breast: Secondary | ICD-10-CM

## 2018-04-18 ENCOUNTER — Ambulatory Visit (HOSPITAL_COMMUNITY): Payer: Medicare Other

## 2018-04-19 DIAGNOSIS — Z1331 Encounter for screening for depression: Secondary | ICD-10-CM | POA: Diagnosis not present

## 2018-04-19 DIAGNOSIS — Z1389 Encounter for screening for other disorder: Secondary | ICD-10-CM | POA: Diagnosis not present

## 2018-04-19 DIAGNOSIS — R002 Palpitations: Secondary | ICD-10-CM | POA: Diagnosis not present

## 2018-04-19 DIAGNOSIS — I70219 Atherosclerosis of native arteries of extremities with intermittent claudication, unspecified extremity: Secondary | ICD-10-CM | POA: Diagnosis not present

## 2018-04-19 DIAGNOSIS — E785 Hyperlipidemia, unspecified: Secondary | ICD-10-CM | POA: Diagnosis not present

## 2018-04-19 DIAGNOSIS — R1032 Left lower quadrant pain: Secondary | ICD-10-CM | POA: Diagnosis not present

## 2018-04-19 DIAGNOSIS — Z0000 Encounter for general adult medical examination without abnormal findings: Secondary | ICD-10-CM | POA: Diagnosis not present

## 2018-04-19 DIAGNOSIS — H9113 Presbycusis, bilateral: Secondary | ICD-10-CM | POA: Diagnosis not present

## 2018-04-19 DIAGNOSIS — R42 Dizziness and giddiness: Secondary | ICD-10-CM | POA: Diagnosis not present

## 2018-05-10 DIAGNOSIS — H6502 Acute serous otitis media, left ear: Secondary | ICD-10-CM | POA: Diagnosis not present

## 2018-05-10 DIAGNOSIS — J209 Acute bronchitis, unspecified: Secondary | ICD-10-CM | POA: Diagnosis not present

## 2018-05-18 ENCOUNTER — Encounter: Payer: Self-pay | Admitting: Podiatry

## 2018-05-18 ENCOUNTER — Ambulatory Visit (INDEPENDENT_AMBULATORY_CARE_PROVIDER_SITE_OTHER): Payer: Medicare Other | Admitting: Podiatry

## 2018-05-18 ENCOUNTER — Ambulatory Visit (INDEPENDENT_AMBULATORY_CARE_PROVIDER_SITE_OTHER): Payer: Medicare Other

## 2018-05-18 DIAGNOSIS — Z79899 Other long term (current) drug therapy: Secondary | ICD-10-CM | POA: Diagnosis not present

## 2018-05-18 DIAGNOSIS — L84 Corns and callosities: Secondary | ICD-10-CM | POA: Diagnosis not present

## 2018-05-18 DIAGNOSIS — M2041 Other hammer toe(s) (acquired), right foot: Secondary | ICD-10-CM

## 2018-05-18 DIAGNOSIS — M779 Enthesopathy, unspecified: Secondary | ICD-10-CM | POA: Diagnosis not present

## 2018-05-18 DIAGNOSIS — M21619 Bunion of unspecified foot: Secondary | ICD-10-CM | POA: Diagnosis not present

## 2018-05-18 DIAGNOSIS — E039 Hypothyroidism, unspecified: Secondary | ICD-10-CM | POA: Diagnosis not present

## 2018-05-18 DIAGNOSIS — M2042 Other hammer toe(s) (acquired), left foot: Secondary | ICD-10-CM

## 2018-05-18 DIAGNOSIS — R5383 Other fatigue: Secondary | ICD-10-CM | POA: Diagnosis not present

## 2018-05-18 DIAGNOSIS — E7800 Pure hypercholesterolemia, unspecified: Secondary | ICD-10-CM | POA: Diagnosis not present

## 2018-05-18 DIAGNOSIS — Z131 Encounter for screening for diabetes mellitus: Secondary | ICD-10-CM | POA: Diagnosis not present

## 2018-05-18 MED ORDER — TRIAMCINOLONE ACETONIDE 10 MG/ML IJ SUSP
10.0000 mg | Freq: Once | INTRAMUSCULAR | Status: AC
  Administered 2018-05-18: 10 mg

## 2018-05-18 NOTE — Patient Instructions (Signed)

## 2018-05-18 NOTE — Progress Notes (Signed)
Subjective:   Patient ID: Pamela Blair, female   DOB: 77 y.o.   MRN: [id number]   HPI Patient presents with severe bunion deformity hammertoe deformity that she wants fixed as soon as possible but understands it may be the after the first year.  States that been very sore she is tried wider shoes she is tried trimming and padding without relief and she does not smoke and likes to be active   Review of Systems  All other systems reviewed and are negative.       Objective:  Physical Exam  Constitutional: She appears well-developed and well-nourished.  Cardiovascular: Intact distal pulses.  Pulmonary/Chest: Effort normal.  Musculoskeletal: Normal range of motion.  Neurological: She is alert.  Skin: Skin is warm.  Nursing note and vitals reviewed.   Neurovascular status intact muscle strength is adequate range of motion within normal limits with patient found to have a severely contracted second digit right with rigid contracture and dorsal keratotic lesion that is painful when pressed.  Patient has inflammation fluid at the inner phalangeal joint and is also found of structural bunion deformity pushing the second toe up in the air with contracture of the third digit also noted.  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  She wants to get this done but will not be able to get it done for several months so I did go ahead today and I anesthetized the second digit and then sterile prep applied to small inner phalangeal joint injection 1.5 mg Dex Methasone Kenalog 1.5 mg Xylocaine and debrided the lesion.  There was slight dorsal bleeding and I applied dressing with  Neosporin instructed on soaks wider shoes and if any issues were to recur she will be seen back we will plan on seeing her back 1 month and discuss surgical intervention in January  X-ray indicates that there is significant rigid contracture digits 2 and 3 right with structural bunion deformity right over left

## 2018-05-19 DIAGNOSIS — E785 Hyperlipidemia, unspecified: Secondary | ICD-10-CM | POA: Diagnosis not present

## 2018-06-01 DIAGNOSIS — M7541 Impingement syndrome of right shoulder: Secondary | ICD-10-CM | POA: Diagnosis not present

## 2018-06-01 DIAGNOSIS — G5601 Carpal tunnel syndrome, right upper limb: Secondary | ICD-10-CM | POA: Diagnosis not present

## 2018-06-08 DIAGNOSIS — I10 Essential (primary) hypertension: Secondary | ICD-10-CM | POA: Diagnosis not present

## 2018-06-08 DIAGNOSIS — R42 Dizziness and giddiness: Secondary | ICD-10-CM | POA: Diagnosis not present

## 2018-06-08 DIAGNOSIS — R079 Chest pain, unspecified: Secondary | ICD-10-CM | POA: Diagnosis not present

## 2018-06-16 ENCOUNTER — Ambulatory Visit: Payer: Medicare Other | Admitting: Podiatry

## 2018-06-17 ENCOUNTER — Ambulatory Visit: Payer: Medicare Other | Admitting: Podiatry

## 2018-07-04 DIAGNOSIS — J029 Acute pharyngitis, unspecified: Secondary | ICD-10-CM | POA: Diagnosis not present

## 2018-07-04 DIAGNOSIS — J209 Acute bronchitis, unspecified: Secondary | ICD-10-CM | POA: Diagnosis not present

## 2018-07-08 ENCOUNTER — Encounter: Payer: Self-pay | Admitting: Podiatry

## 2018-07-08 ENCOUNTER — Ambulatory Visit (INDEPENDENT_AMBULATORY_CARE_PROVIDER_SITE_OTHER): Payer: Medicare Other | Admitting: Podiatry

## 2018-07-08 DIAGNOSIS — M21619 Bunion of unspecified foot: Secondary | ICD-10-CM | POA: Diagnosis not present

## 2018-07-08 DIAGNOSIS — M2042 Other hammer toe(s) (acquired), left foot: Secondary | ICD-10-CM | POA: Diagnosis not present

## 2018-07-08 DIAGNOSIS — M2041 Other hammer toe(s) (acquired), right foot: Secondary | ICD-10-CM | POA: Diagnosis not present

## 2018-07-08 NOTE — Patient Instructions (Signed)
Pre-Operative Instructions  Congratulations, you have decided to take an important step towards improving your quality of life.  You can be assured that the doctors and staff at [facility] will be with you every step of the way.  Here are some important things you should know:  1. Plan to be at the surgery center/hospital at least 1 (one) hour prior to your scheduled time, unless otherwise directed by the surgical center/hospital staff.  You must have a responsible adult accompany you, remain during the surgery and drive you home.  Make sure you have directions to the surgical center/hospital to ensure you arrive on time. 2. If you are having surgery at [facility] or [facility] hospitals, you will need a copy of your medical history and physical form from your family physician within one month prior to the date of surgery. We will give you a form for your primary physician to complete.  3. We make every effort to accommodate the date you request for surgery.  However, there are times where surgery dates or times have to be moved.  We will contact you as soon as possible if a change in schedule is required.   4. No aspirin/ibuprofen for one week before surgery.  If you are on aspirin, any non-steroidal anti-inflammatory medications (Mobic, Aleve, Ibuprofen) should not be taken seven (7) days prior to your surgery.  You make take Tylenol for pain prior to surgery.  5. Medications - If you are taking daily heart and blood pressure medications, seizure, reflux, allergy, asthma, anxiety, pain or diabetes medications, make sure you notify the surgery center/hospital before the day of surgery so they can tell you which medications you should take or avoid the day of surgery. 6. No food or drink after midnight the night before surgery unless directed otherwise by surgical center/hospital staff. 7. No alcoholic beverages 24-hours prior to surgery.  No smoking 24-hours prior or 24-hours after  surgery. 8. Wear loose pants or shorts. They should be loose enough to fit over bandages, boots, and casts. 9. Don't wear slip-on shoes. Sneakers are preferred. 10. Bring your boot with you to the surgery center/hospital.  Also bring crutches or a walker if your physician has prescribed it for you.  If you do not have this equipment, it will be provided for you after surgery. 11. If you have not been contacted by the surgery center/hospital by the day before your surgery, call to confirm the date and time of your surgery. 12. Leave-time from work may vary depending on the type of surgery you have.  Appropriate arrangements should be made prior to surgery with your employer. 13. Prescriptions will be provided immediately following surgery by your doctor.  Fill these as soon as possible after surgery and take the medication as directed. Pain medications will not be refilled on weekends and must be approved by the doctor. 14. Remove nail polish on the operative foot and avoid getting pedicures prior to surgery. 15. Wash the night before surgery.  The night before surgery wash the foot and leg well with water and the antibacterial soap provided. Be sure to pay special attention to beneath the toenails and in between the toes.  Wash for at least three (3) minutes. Rinse thoroughly with water and dry well with a towel.  Perform this wash unless told not to do so by your physician.  Enclosed: 1 Ice pack (please put in freezer the night before surgery)   1 Hibiclens skin cleaner     Pre-op instructions  If you have any questions regarding the instructions, please do not hesitate to call our office.  [place]: [address], [place], [place] [address] -- [phone]  [place]: [address] [place], [place] [address] -- [phone]  [place]: [address]  [place], [place] [address] -- [phone]  [place]: [address], [address], [place],  [address] -- [phone]  Website:  [email or web address] 

## 2018-07-09 NOTE — Progress Notes (Signed)
Subjective:   Patient ID: Pamela Blair, female   DOB: 78 y.o.   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  I allowed patient to go over consent form and I reviewed it at great length and reviewed all possible complications associated with this surgery and the fact the total recovery can take 6 months to 1 year.  Patient wants surgery understanding that her toes may not purchase the ground postoperatively and all other complications and she is scheduled for outpatient surgery.  She is encouraged to call with questions concerns and I did dispense air fracture walker for the postoperative.  And gave her instructions on its usage

## 2018-07-19 ENCOUNTER — Encounter: Payer: Self-pay | Admitting: Podiatry

## 2018-07-19 DIAGNOSIS — M2011 Hallux valgus (acquired), right foot: Secondary | ICD-10-CM | POA: Diagnosis not present

## 2018-07-19 DIAGNOSIS — M2041 Other hammer toe(s) (acquired), right foot: Secondary | ICD-10-CM

## 2018-07-21 ENCOUNTER — Telehealth: Payer: Self-pay | Admitting: Podiatry

## 2018-07-21 NOTE — Telephone Encounter (Signed)
Pt had surgery on Tuesday 1/14 and is calling with some concerns regarding her surgery foot. Pt stated "something doesn't feel right", said there was some dry blood on the foot, pt is unable to walk on foot even with boot and states she is experiencing severe pain. Please give pt a call back, concerned that this isn't normal.

## 2018-07-21 NOTE — Telephone Encounter (Signed)
I called pt and she states yesterday she was doing good and was able to walk, but this morning feels the foot is swelling and pressing on the leg, opened the boot and iced, took one pill, but still can not walk. I told pt, she was having more pain today, because all of the numbing agent had left the foot, now what she was feeling was the sensation of the foot and how the pain medication was covering the pain. Pt states she had taken her boot off because it felt like her foot was swelling but there is no swelling. I told pt that an ace wrap was applied after surgery to staunch post op bleeding and swelling, and often after it has done it's job it may feel too tight. I told pt to be seated, remove the boot, open-ended sock, ace wrap, then elevate the surgery foot for 15 minutes, after 15 minutes place foot level with the hip and beginning at the toes loosely rewrapping the ace wrap rolling single layer up the leg, replace sock and boot. Pt states understanding, and will take the ibuprofen in between the pain medications. I told pt to also use a walker when moving around in the boot, it would help her get the surgery foot up quicker even though she will not be walking faster. Pt states understanding.

## 2018-07-22 ENCOUNTER — Telehealth: Payer: Self-pay | Admitting: *Deleted

## 2018-07-22 DIAGNOSIS — M21619 Bunion of unspecified foot: Secondary | ICD-10-CM

## 2018-07-22 DIAGNOSIS — Z9889 Other specified postprocedural states: Secondary | ICD-10-CM

## 2018-07-22 NOTE — Telephone Encounter (Signed)
Pt presents to office to have surgery foot checked for dressing that she feels is too tight. I removed the cam walker from the right surgery foot, ace was wrapped only over the surgery dressing not single layer, which would decrease direct uneven pressure to the surgery foot. Pt's right leg was the same temperature as the left leg, the right foot toes were warm, and color returned quickly when pressed. I inspected the dressing that pt left remaining in place and there was a thickened area at the ball of the foot, I loosened and arranged as a single layer, then wrapped with one layer of roll gauze and covered with open-ended sock and THEN the ace wrap making it easier for pt to remove without disturbing the gauze dressing. Pt's right ankle was 1-2 swelling and instructed pt to place ice packs on either side of the ankle when resting without the boot, and to protect the skin with cloth when applying ice. I offered pt a knee scooter, she accepted and I told her to expect a call from [place]. Pt asked what to do for constipation and I wrote Col[facility] on Dr. Mellody Drown business card and gave to pt with remaining gauze from the sterile dressing pt I had used for her and place both in a [facility] Bag. Order faxed to [facility] for Knee scooter and emailed to A. Barnet Glasgow.

## 2018-07-22 NOTE — Telephone Encounter (Signed)
Pt called states the dressing on the foot is too tight, and the blood is dried, she states she has taken some of the dressing off. I informed pt if she would come to the office at noon, I would adjust the dressing. Pt states understanding.

## 2018-07-27 ENCOUNTER — Ambulatory Visit (INDEPENDENT_AMBULATORY_CARE_PROVIDER_SITE_OTHER): Payer: Medicare Other

## 2018-07-27 ENCOUNTER — Encounter: Payer: Self-pay | Admitting: Podiatry

## 2018-07-27 ENCOUNTER — Ambulatory Visit (INDEPENDENT_AMBULATORY_CARE_PROVIDER_SITE_OTHER): Payer: Medicare Other | Admitting: Podiatry

## 2018-07-27 DIAGNOSIS — Z9889 Other specified postprocedural states: Secondary | ICD-10-CM

## 2018-07-27 DIAGNOSIS — M21619 Bunion of unspecified foot: Secondary | ICD-10-CM | POA: Diagnosis not present

## 2018-07-27 DIAGNOSIS — M2041 Other hammer toe(s) (acquired), right foot: Secondary | ICD-10-CM

## 2018-07-27 DIAGNOSIS — M2042 Other hammer toe(s) (acquired), left foot: Secondary | ICD-10-CM

## 2018-07-27 NOTE — Progress Notes (Signed)
Subjective:   Patient ID: Pamela Blair, female   DOB: 78 y.o.   MRN: [id number]   HPI Patient states doing very well with surgery with minimal discomfort and walking with her boot comfortably and states she is only had to take ibuprofen   ROS      Objective:  Physical Exam  Neurovascular status intact negative Homans sign was noted with patient's right foot doing very well with wound edges well coapted hallux in rectus position and digits in good alignment with pins in place     Assessment:  Doing well post osteotomy first metatarsal right and digital fusion digits 2 3 right     Plan:  H&P condition reviewed and at this point I went ahead and reapplied sterile dressing instructed to continue compression elevation and immobilization and reappoint 2 weeks or earlier if needed  X-rays indicate osteotomies healing well good distal good alignment with fixation in place signed visit

## 2018-07-30 ENCOUNTER — Other Ambulatory Visit: Payer: Self-pay | Admitting: Podiatry

## 2018-07-30 ENCOUNTER — Telehealth: Payer: Self-pay | Admitting: Podiatry

## 2018-07-30 MED ORDER — CEPHALEXIN 500 MG PO CAPS: 500.0000 mg | ORAL_CAPSULE | Freq: Two times a day (BID) | ORAL | 0 refills | Status: DC

## 2018-07-30 NOTE — Telephone Encounter (Signed)
States she unwrapped her foot today and it is swollen, red, warm, and painful. Was seen Wednesday and the foot looked and felt fine. Denies N/V/F/Ch. Discussed Rest, Ice, Compression, Elevation. Abx called in to pharmacy as precaution. Will have pt see RN Monday for eval.

## 2018-08-01 ENCOUNTER — Ambulatory Visit (INDEPENDENT_AMBULATORY_CARE_PROVIDER_SITE_OTHER): Payer: Self-pay | Admitting: Podiatry

## 2018-08-01 ENCOUNTER — Ambulatory Visit (INDEPENDENT_AMBULATORY_CARE_PROVIDER_SITE_OTHER): Payer: Medicare Other

## 2018-08-01 DIAGNOSIS — M2041 Other hammer toe(s) (acquired), right foot: Secondary | ICD-10-CM | POA: Diagnosis not present

## 2018-08-01 DIAGNOSIS — Z9889 Other specified postprocedural states: Secondary | ICD-10-CM

## 2018-08-01 DIAGNOSIS — M21619 Bunion of unspecified foot: Secondary | ICD-10-CM

## 2018-08-01 DIAGNOSIS — M2042 Other hammer toe(s) (acquired), left foot: Secondary | ICD-10-CM

## 2018-08-02 NOTE — Progress Notes (Signed)
Subjective:   Patient ID: Pamela Blair, female   DOB: 78 y.o.   MRN: [id number]   HPI Patient presents concerned because she bumped her foot and also she thought maybe her toes were red and she wanted them checked   ROS      Objective:  Physical Exam  Neurovascular status intact negative Homans sign noted with incision site right healing well pin in place second toe wound edges well coapted hallux in rectus position with no current drainage or active redness noted     Assessment:  I did not note any pathology organically with this with good alignment of the digit pin in place and first MPJ in good alignment with no drainage     Plan:  I conveyed all this to her and I advised her to continue with elevation compression immobilization and patient will be seen back for regular visit  Precautionary x-ray taken today indicated that fixation is in place with no indications of pathology associated with

## 2018-08-10 ENCOUNTER — Ambulatory Visit (INDEPENDENT_AMBULATORY_CARE_PROVIDER_SITE_OTHER): Payer: Medicare Other | Admitting: Podiatry

## 2018-08-10 ENCOUNTER — Ambulatory Visit (INDEPENDENT_AMBULATORY_CARE_PROVIDER_SITE_OTHER): Payer: Medicare Other

## 2018-08-10 DIAGNOSIS — M21619 Bunion of unspecified foot: Secondary | ICD-10-CM

## 2018-08-10 DIAGNOSIS — M21611 Bunion of right foot: Secondary | ICD-10-CM | POA: Diagnosis not present

## 2018-08-10 DIAGNOSIS — Z9889 Other specified postprocedural states: Secondary | ICD-10-CM | POA: Diagnosis not present

## 2018-08-14 NOTE — Progress Notes (Signed)
   Subjective:  Patient presents today status post bunionectomy and hammertoe repair digits 2 and 3 right. DOS: 07/19/2018. She states she is doing well. She denies any pain or modifying factors but expresses concern that the pins may have moved. She has been using the post op shoe as directed. Patient is here for further evaluation and treatment.    Past Medical History:  Diagnosis Date  . Blood transfusion without reported diagnosis   . Cervical spinal stenosis   . Cervical spondylosis   . Hyperlipidemia   . Osteoporosis       Objective/Physical Exam Neurovascular status intact.  Skin incisions appear to be well coapted with sutures and staples intact. No sign of infectious process noted. No dehiscence. No active bleeding noted. Moderate edema noted to the surgical extremity.  Radiographic Exam:  Orthopedic hardware and osteotomies sites appear to be stable with routine healing.  Assessment: 1. s/p bunionectomy and hammertoe repair digits 2 and 3 right. DOS: 07/19/2018   Plan of Care:  1. Patient was evaluated. X-rays reviewed 2. Sutures removed. Dressing changed.  3. Continue weightbearing in post op shoe.  4. Return to clinic in 2 weeks for pin removal.    Edrick Kins, DPM [facility]  Dr. Edrick Kins, [place]                                        [place],  [address]                Office [phone]  Fax [phone]

## 2018-08-18 ENCOUNTER — Telehealth: Payer: Self-pay | Admitting: Podiatry

## 2018-08-18 NOTE — Telephone Encounter (Signed)
I'm calling to speak to the nurse. I'm concerned about my foot. It looks like my pins are coming out more.

## 2018-08-18 NOTE — Telephone Encounter (Signed)
I called pt and she states the pins looks like it is coming out and I told pt that if she is walking on the foot too much it would work the pins out, that she should rest. Pt states the foot feels heavy and is worried more. I offered pt an appt today and she states she can not come in today. I told pt to rest, and if the pin comes out, to cover the end hole with antibiotic ointment and a bandaid and call our office. Transferred pt to scheduler for appt tomorrow.

## 2018-08-18 NOTE — Telephone Encounter (Signed)
I'm concerned about my foot. It is more swollen, more tight, its just a little different and I want to make sure everything is okay.

## 2018-08-19 ENCOUNTER — Encounter: Payer: Self-pay | Admitting: Podiatry

## 2018-08-19 ENCOUNTER — Ambulatory Visit (INDEPENDENT_AMBULATORY_CARE_PROVIDER_SITE_OTHER): Payer: Medicare Other

## 2018-08-19 ENCOUNTER — Ambulatory Visit (INDEPENDENT_AMBULATORY_CARE_PROVIDER_SITE_OTHER): Payer: Medicare Other | Admitting: Podiatry

## 2018-08-19 DIAGNOSIS — M2011 Hallux valgus (acquired), right foot: Secondary | ICD-10-CM | POA: Diagnosis not present

## 2018-08-21 NOTE — Progress Notes (Signed)
Subjective:   Patient ID: Pamela Blair, female   DOB: 78 y.o.   MRN: [id number]   HPI Patient states overall doing really well and states that the pins seem to be loose at this point and they were pushed in last week but overall she is pleased and is walking with minimal discomfort   ROS      Objective:  Physical Exam  Neurovascular status intact with digits and excellent alignment first MPJ healing well wound edges well coapted hallux in rectus position with the toes sitting in good position.  The pins are slightly loose which is normal for this.  Postop intact     Assessment:  Overall doing well with pins in good alignment but getting loose     Plan:  H&P x-ray reviewed and explained that this is normal to occur over this.  Postop.  Sterile removal of pants with sterile dressings applied and gave instructions for continuing to work on range of motion and gradual return to soft shoe gear and reappoint 4 weeks or earlier if needed  X-rays indicate the osteotomy looks good fixation in place toes in good alignment signed visit

## 2018-08-25 ENCOUNTER — Other Ambulatory Visit: Payer: Medicare Other

## 2018-08-26 ENCOUNTER — Telehealth: Payer: Self-pay | Admitting: *Deleted

## 2018-08-26 NOTE — Telephone Encounter (Signed)
Pt called states she has swelling in the surgery foot from the toes to the top and does not think the sock is right for her.

## 2018-08-26 NOTE — Telephone Encounter (Signed)
I spoke with pt, she states she is wearing the compression sock to bed. I told pt that was causing the swelling. I told pt not to wear anything that would cause constriction on the foot today, to rest, and elevate when possible. I told pt beginning tomorrow before getting out of the bed, starting at the toes wrap ace loosely single layer down the foot and up the leg, wear all day and take off at night, do not wear anything on the foot in bed, do this Saturday and Sunday. I told pt to begin the compression sock on Monday 1st thing like she applied the ace wrap. Pt states understanding.

## 2018-10-07 ENCOUNTER — Encounter: Payer: Medicare Other | Admitting: Podiatry

## 2018-11-18 ENCOUNTER — Ambulatory Visit: Payer: Medicare Other | Admitting: Podiatry

## 2018-11-23 ENCOUNTER — Ambulatory Visit: Payer: Medicare Other | Admitting: Podiatry

## 2018-11-24 ENCOUNTER — Encounter: Payer: Self-pay | Admitting: Podiatry

## 2018-11-24 ENCOUNTER — Ambulatory Visit (INDEPENDENT_AMBULATORY_CARE_PROVIDER_SITE_OTHER): Payer: Medicare Other

## 2018-11-24 ENCOUNTER — Ambulatory Visit (INDEPENDENT_AMBULATORY_CARE_PROVIDER_SITE_OTHER): Payer: Medicare Other | Admitting: Podiatry

## 2018-11-24 ENCOUNTER — Other Ambulatory Visit: Payer: Self-pay

## 2018-11-24 VITALS — Temp 98.2°F

## 2018-11-24 DIAGNOSIS — M2011 Hallux valgus (acquired), right foot: Secondary | ICD-10-CM | POA: Diagnosis not present

## 2018-11-25 NOTE — Progress Notes (Signed)
Subjective:   Patient ID: Pamela Blair, female   DOB: 78 y.o.   MRN: [id number]   HPI Patient states that she is doing really well with mild discomfort only if she is been very active but overall having a little swelling in her toes are in good alignment with no pain   ROS      Objective:  Physical Exam  Neurovascular status intact with excellent structure first metatarsal right second and third toes with mild dorsal swelling consistent with this.  Postop     Assessment:  Doing well post forefoot surgery right     Plan:  H&P and x-ray reviewed and allowed patient to return to normal activities.  Patient will be seen back on an as-needed basis and is encouraged to call with questions concerns  X-rays indicate that she has good alignment noted fixation in good place at this time

## 2018-12-01 ENCOUNTER — Other Ambulatory Visit (HOSPITAL_COMMUNITY): Payer: Self-pay | Admitting: *Deleted

## 2018-12-02 ENCOUNTER — Inpatient Hospital Stay (HOSPITAL_COMMUNITY): Admission: RE | Admit: 2018-12-02 | Payer: Medicare Other | Source: Ambulatory Visit

## 2018-12-07 ENCOUNTER — Other Ambulatory Visit: Payer: Self-pay

## 2018-12-07 ENCOUNTER — Ambulatory Visit (HOSPITAL_COMMUNITY)
Admission: RE | Admit: 2018-12-07 | Discharge: 2018-12-07 | Disposition: A | Discharge status: Home / Self Care | Payer: Medicare Other | Source: Ambulatory Visit | Attending: Internal Medicine | Admitting: Internal Medicine

## 2018-12-07 DIAGNOSIS — M810 Age-related osteoporosis without current pathological fracture: Secondary | ICD-10-CM | POA: Insufficient documentation

## 2018-12-07 MED ORDER — DENOSUMAB 60 MG/ML ~~LOC~~ SOSY
PREFILLED_SYRINGE | SUBCUTANEOUS | Status: AC
  Filled 2018-12-07: qty 1

## 2018-12-07 MED ORDER — DENOSUMAB 60 MG/ML ~~LOC~~ SOSY
60.0000 mg | PREFILLED_SYRINGE | Freq: Once | SUBCUTANEOUS | Status: AC
  Administered 2018-12-07: 60 mg via SUBCUTANEOUS

## 2019-03-29 ENCOUNTER — Other Ambulatory Visit: Payer: Self-pay | Admitting: Internal Medicine

## 2019-03-29 DIAGNOSIS — E785 Hyperlipidemia, unspecified: Secondary | ICD-10-CM

## 2019-03-29 LAB — IFOBT (OCCULT BLOOD): IFOBT: POSITIVE

## 2019-04-03 ENCOUNTER — Other Ambulatory Visit: Payer: Self-pay | Admitting: Internal Medicine

## 2019-04-03 DIAGNOSIS — D329 Benign neoplasm of meninges, unspecified: Secondary | ICD-10-CM

## 2019-09-12 ENCOUNTER — Encounter: Payer: Self-pay | Admitting: Gastroenterology

## 2019-09-19 ENCOUNTER — Ambulatory Visit: Payer: Medicare Other | Admitting: Gastroenterology

## 2019-09-26 ENCOUNTER — Ambulatory Visit: Payer: Medicare Other | Admitting: Gastroenterology

## 2019-10-23 ENCOUNTER — Other Ambulatory Visit: Payer: Self-pay

## 2019-10-23 ENCOUNTER — Ambulatory Visit (INDEPENDENT_AMBULATORY_CARE_PROVIDER_SITE_OTHER): Payer: Medicare Other | Admitting: Internal Medicine

## 2019-10-23 ENCOUNTER — Encounter: Payer: Self-pay | Admitting: Internal Medicine

## 2019-10-23 VITALS — BP 138/82 | HR 74 | Temp 97.2°F | Ht 60.0 in | Wt 138.0 lb

## 2019-10-23 DIAGNOSIS — R0602 Shortness of breath: Secondary | ICD-10-CM

## 2019-10-23 DIAGNOSIS — R072 Precordial pain: Secondary | ICD-10-CM | POA: Diagnosis not present

## 2019-10-23 DIAGNOSIS — Z01812 Encounter for preprocedural laboratory examination: Secondary | ICD-10-CM

## 2019-10-23 DIAGNOSIS — R9431 Abnormal electrocardiogram [ECG] [EKG]: Secondary | ICD-10-CM | POA: Diagnosis not present

## 2019-10-23 MED ORDER — METOPROLOL TARTRATE 50 MG PO TABS: ORAL_TABLET | ORAL | 0 refills | Status: DC

## 2019-10-23 NOTE — Patient Instructions (Addendum)
Medication Instructions:  Your physician recommends that you continue on your current medications as directed. Please refer to the Current Medication list given to you today.  *If you need a refill on your cardiac medications before your next appointment, please call your pharmacy*  Testing/Procedures: Dr. Debara Pickett has ordered a cardiac CT. This is done at [facility]. The test will be pre-certified and then you will get a call to schedule.    Follow-Up: At [facility], you and your health needs are our priority.  As part of our continuing mission to provide you with exceptional heart care, we have created designated Provider Care Teams.  These Care Teams include your primary Cardiologist (physician) and Advanced Practice Providers (APPs -  Physician Assistants and Nurse Practitioners) who all work together to provide you with the care you need, when you need it.  We recommend signing up for the patient portal called "MyChart".  Sign up information is provided on this After Visit Summary.  MyChart is used to connect with patients for Virtual Visits (Telemedicine).  Patients are able to view lab/test results, encounter notes, upcoming appointments, etc.  Non-urgent messages can be sent to your provider as well.   To learn more about what you can do with MyChart, go to [email or web address].    Your next appointment:   6-8 week(s)  The format for your next appointment:   In Person  Provider:   You may see Dr. Lyman Bishop or one of the following Advanced Practice Providers on your designated Care Team:    Almyra Deforest, PA-C  Fabian Sharp, [place] or   Roby Lofts, [place]    Other Instructions  Your cardiac CT will be scheduled at one of the below locations:   [facility] [address] [place], [place] [address] [phone]  [place] [address] [place], [place] [address] [phone]  If scheduled at  [facility], please arrive at the [facility] main entrance of [facility] 30 minutes prior to test start time. Proceed to the [facility] Radiology Department (first floor) to check-in and test prep.  If scheduled at [facility], please arrive 15 mins early for check-in and test prep.  Please follow these instructions carefully (unless otherwise directed):  Hold all erectile dysfunction medications at least 3 days (72 hrs) prior to test.  On the Night Before the Test: . Be sure to Drink plenty of water. . Do not consume any caffeinated/decaffeinated beverages or chocolate 12 hours prior to your test. . Do not take any antihistamines 12 hours prior to your test.  On the Day of the Test: . Drink plenty of water. Do not drink any water within one hour of the test. . Do not eat any food 4 hours prior to the test. . You may take your regular medications prior to the test.  . Take metoprolol (Lopressor) two hours prior to test.. . FEMALES- please wear underwire-free bra if available      After the Test: . Drink plenty of water. . After receiving IV contrast, you may experience a mild flushed feeling. This is normal. . On occasion, you may experience a mild rash up to 24 hours after the test. This is not dangerous. If this occurs, you can take Benadryl 25 mg and increase your fluid intake. . If you experience trouble breathing, this can be serious. If it is severe call 911 IMMEDIATELY. If it is  mild, please call our office. . If you take any of these medications: Glipizide/Metformin, Avandament, Glucavance, please do not take 48 hours after completing test unless otherwise instructed.   Once we have confirmed authorization from your insurance company, we will call you to set up a date and time for your test.   For non-scheduling related questions, please contact the cardiac imaging nurse navigator should you have any questions/concerns: Marchia Bond,  RN Navigator Cardiac Imaging [facility] [phone] office  For scheduling needs, including cancellations and rescheduling, please call [phone].

## 2019-10-24 ENCOUNTER — Encounter: Payer: Self-pay | Admitting: Internal Medicine

## 2019-10-24 NOTE — Progress Notes (Signed)
OFFICE NOTE  Chief Complaint:  Chest discomfort  Primary Care Physician: Crist Infante, MD  HPI:  Pamela Blair is a pleasant 79 year old female who was present followed by Dr. Linna Darner and currently is seeing Dr. Hardie Shackleton. She was referred for evaluation of chest pain per Dr. Huntley Estelle notes, however she is "not clear why she is here". She occasionally has some chest discomfort in fact she notes some pain in her left upper chest and left shoulder blade particular when moving and lifting things. She gets pain it goes down her left arm. She does have a history of neck surgery from an anterior approach. 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 She does have a prior spinal compression fracture. In addition she seemed to have aortic atherosclerosis on imaging. A family history is not significant for any coronary disease. She does have some Arctic risk factors including dyslipidemia and was recently started on pravastatin. She was also prescribed etodolac for arthritis, but she never got the prescription filled and is not taking it. Her chest pain is not necessarily worse with exertion or relieved by rest. She does report some increasing shortness of breath which is unusual for her.  10/24/2019  Pamela Blair returns today for follow-up.  I last saw her in 2016 and therefore she is considered a new patient.  In the past she had undergone some testing for presumed angina although she had no complaints at that time.  Today she is also without any significant symptoms.  She reported that she had some unusual symptoms that were concerning for possible heart disease.  She has been under a lot of stress as her husband apparently died over the holidays from COVID-19.  She says she was out of town and was able to come back with her son who was a Engineer, drilling.  They were living in [place].  She is describing more shortness of breath and fatigue and seems to be quite  emotional at this point.  She denies any pain in the chest.  She also feels some palpitations at times or symptoms of her heart racing.  PMHx:  Past Medical History:  Diagnosis Date  . Blood transfusion without reported diagnosis   . Cervical spinal stenosis   . Cervical spondylosis   . Hyperlipidemia   . Osteoporosis     Past Surgical History:  Procedure Laterality Date  . ABDOMINAL HYSTERECTOMY     and BSO, dx- dysfunctional bleeding  . ANTERIOR CERVICAL DECOMP/DISCECTOMY FUSION  10/3-10/2010   Dr Pamala Hurry , [place] , [place]   . CESAREAN SECTION     x 4  . COLONOSCOPY     Dr Deatra Ina  . 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 TOTAL KNEE ARTHROPLASTY      FAMHx:  Family History  Problem Relation Age of Onset  . Stroke Mother 34  . Heart attack Father 4  . Diabetes Neg Hx   . Cancer Neg Hx     SOCHx:   reports that she has never smoked. She has never used smokeless tobacco. She reports that she does not drink alcohol or use drugs.  ALLERGIES:  Allergies  Allergen Reactions  . Niacin     rash  . Clarithromycin Other (See Comments)    unknown reaction  . Neomycin-Bacitracin-Polymyxin  [Bacitracin-Neomycin-Polymyxin]   . Neomycin Rash    ROS: Pertinent items noted in  HPI and remainder of comprehensive ROS otherwise negative.  HOME MEDS: Current Outpatient Medications  Medication Sig Dispense Refill  . acetaminophen-codeine (TYLENOL #2) 300-15 MG tablet Take by mouth.    . Bromfenac Sodium 0.07 % SOLN Apply to eye.    Marland Kitchen buPROPion (WELLBUTRIN XL) 150 MG 24 hr tablet Take 150 mg by mouth daily.    . Cholecalciferol (VITAMIN D3) 2000 UNITS capsule Take 1 capsule (2,000 Units total) by mouth daily. 100 capsule 3  . diclofenac Sodium (VOLTAREN) 1 % GEL Apply topically.    Marland Kitchen ezetimibe (ZETIA) 10 MG tablet Take 10 mg by mouth daily.    Marland Kitchen loteprednol (LOTEMAX) 0.5 % ophthalmic suspension 1 drop 4 (four) times daily.    Marland Kitchen LUMIGAN  0.01 % SOLN 1 drop daily.    . nepafenac (ILEVRO) 0.3 % ophthalmic suspension Apply to eye.    . traMADol (ULTRAM) 50 MG tablet Take 1 tablet (50 mg total) by mouth every 8 (eight) hours as needed. 60 tablet 1  . metoprolol tartrate (LOPRESSOR) 50 MG tablet Take ONE tablet TWO HOURS prior to test. 1 tablet 0   No current facility-administered medications for this visit.    LABS/IMAGING: No results found for this or any previous visit (from the past 48 hour(s)). No results found.  WEIGHTS: Wt Readings from Last 3 Encounters:  10/23/19 138 lb (62.6 kg)  10/31/15 131 lb (59.4 kg)  10/02/15 131 lb 12.8 oz (59.8 kg)    VITALS: BP 138/82   Pulse 74   Temp (!) 97.2 F (36.2 C)   Ht 5' (1.524 m)   Wt 138 lb (62.6 kg)   BMI 26.95 kg/m   EXAM: General appearance: alert and no distress Neck: no carotid bruit and no JVD Lungs: clear to auscultation bilaterally Heart: regular rate and rhythm, S1, S2 normal, no murmur, click, rub or gallop Abdomen: soft, non-tender; bowel sounds normal; no masses,  no organomegaly Extremities: Reproducible left upper chest and left upper scapular pain on palpation Pulses: 2+ and symmetric Skin: Skin color, texture, turgor normal. No rashes or lesions Neurologic: Grossly normal Psych: Pleasant  EKG: Normal sinus rhythm at 74, nonspecific T wave changes, QTC 479 ms-personally reviewed  ASSESSMENT: 1. Progressive dyspnea on exertion, palpitations, fatigue 2. Aortic atherosclerosis 3. Cervical and thoracic spine disease 4. 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   Pamela Blair is again describing some progressive dyspnea on exertion, palpitations and fatigue but no real chest pain.  She is grieving after the loss of her husband only a few months ago to COVID-19 which seems somewhat unexpected.  I suspect stress is playing a significant role in this.  We discussed options including an echocardiogram or CT coronary angiography.  She was very  concerned about "a blockage".  We will therefore start with CT coronary angiography.  If there is no significant obstructive disease we could consider follow-up echocardiogram or other imaging to evaluate structure and function.  Follow-up with me afterwards.  Thanks again for the kind referral.  Pixie Casino, MD, FACC, [place] Director of the [facility] Diplomate of the American Board of Clinical Lipidology Attending Cardiologist  Direct Dial: [phone]  Fax: [phone]  Website:  [email or web address] 10/24/2019, 9:27 PM

## 2019-10-30 ENCOUNTER — Ambulatory Visit: Payer: Medicare Other | Attending: Internal Medicine

## 2019-10-30 DIAGNOSIS — Z20822 Contact with and (suspected) exposure to covid-19: Secondary | ICD-10-CM

## 2019-10-31 LAB — SARS-COV-2, NAA 2 DAY TAT

## 2019-10-31 LAB — NOVEL CORONAVIRUS, NAA: SARS-CoV-2, NAA: NOT DETECTED

## 2019-11-08 ENCOUNTER — Encounter: Payer: Self-pay | Admitting: Physician Assistant

## 2019-11-08 ENCOUNTER — Ambulatory Visit (INDEPENDENT_AMBULATORY_CARE_PROVIDER_SITE_OTHER): Payer: Medicare Other | Admitting: Physician Assistant

## 2019-11-08 VITALS — BP 126/64 | HR 65 | Temp 98.5°F | Ht 60.0 in | Wt 139.0 lb

## 2019-11-08 DIAGNOSIS — Z86010 Personal history of colonic polyps: Secondary | ICD-10-CM | POA: Diagnosis not present

## 2019-11-08 DIAGNOSIS — R195 Other fecal abnormalities: Secondary | ICD-10-CM | POA: Diagnosis not present

## 2019-11-08 MED ORDER — NA SULFATE-K SULFATE-MG SULF 17.5-3.13-1.6 GM/177ML PO SOLN
1.0000 | Freq: Once | ORAL | 0 refills | Status: AC
Start: 2019-11-08 — End: 2019-11-08

## 2019-11-08 NOTE — Patient Instructions (Addendum)
If you are age 77 or older, your body mass index should be between 23-30. Your Body mass index is 27.15 kg/m. If this is out of the aforementioned range listed, please consider follow up with your Primary Care Provider.  If you are age 66 or younger, your body mass index should be between 19-25. Your Body mass index is 27.15 kg/m. If this is out of the aformentioned range listed, please consider follow up with your Primary Care Provider.   You have been scheduled for a colonoscopy. Please follow written instructions given to you at your visit today.  Please pick up your prep supplies at the pharmacy within the next 1-3 days. If you use inhalers (even only as needed), please bring them with you on the day of your procedure.

## 2019-11-08 NOTE — Progress Notes (Addendum)
Chief Complaint: Heme positive stool  HPI:    Pamela Blair is a 79 year old female with past medical history as listed below, known to Dr. Rachael Darby, who was referred to me by Crist Infante, MD for a complaint of positive stool.      10/31/2015 colonoscopy for screening with a 2 mm polyp in the cecum and otherwise normal.  Pathology showed tubular adenoma and repeat was not recommended due to age.    Today, the patient presents clinic and tells me that about 5 months ago now she had a Hemoccult study with her PCP which was positive during an annual exam.  She was then told that she needed another colonoscopy but got called down to [place] to be with her son and his family for a period of time and has just now made it back into town.  Patient is very worried about this positive Hemoccult and what it could mean for her.  She has been anxious about wanting to get her colonoscopy done.    Denies fever, chills, weight loss, seeing any blood in her stool, change in bowel habits or abdominal pain.  Past Medical History:  Diagnosis Date  . Blood transfusion without reported diagnosis   . Cervical spinal stenosis   . Cervical spondylosis   . Hyperlipidemia   . Osteoporosis     Past Surgical History:  Procedure Laterality Date  . ABDOMINAL HYSTERECTOMY     and BSO, dx- dysfunctional bleeding  . ANTERIOR CERVICAL DECOMP/DISCECTOMY FUSION  10/3-10/2010   Dr Pamala Hurry , [place] , [place]   . CESAREAN SECTION     x 4  . COLONOSCOPY     Dr Deatra Ina  . 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 TOTAL KNEE ARTHROPLASTY      Current Outpatient Medications  Medication Sig Dispense Refill  . buPROPion (WELLBUTRIN XL) 150 MG 24 hr tablet Take 150 mg by mouth daily.    . Cholecalciferol (VITAMIN D3) 2000 UNITS capsule Take 1 capsule (2,000 Units total) by mouth daily. 100 capsule 3  . ezetimibe (ZETIA) 10 MG tablet Take 10 mg by mouth daily.     No current  facility-administered medications for this visit.    Allergies as of 11/08/2019 - Review Complete 11/08/2019  Allergen Reaction Noted  . Niacin  08/10/2017  . Clarithromycin Other (See Comments)   . Neomycin-bacitracin-polymyxin  [bacitracin-neomycin-polymyxin]  08/10/2017  . Neomycin Rash 01/31/2015    Family History  Problem Relation Age of Onset  . Stroke Mother 86  . Heart attack Father 45  . Diabetes Neg Hx   . Cancer Neg Hx     Social History   Socioeconomic History  . Marital status: Widowed    Spouse name: Not on file  . Number of children: 4  . Years of education: Not on file  . Highest education level: Not on file  Occupational History  . Occupation: house wife  Tobacco Use  . Smoking status: Never Smoker  . Smokeless tobacco: Never Used  Substance and Sexual Activity  . Alcohol use: No    Alcohol/week: 0.0 standard drinks  . Drug use: No  . Sexual activity: Not on file  Other Topics Concern  . Not on file  Social History Narrative  . Not on file   Social Determinants of Health   Financial Resource Strain:   . Difficulty of Paying Living Expenses:   Food Insecurity:   .  Worried About Charity fundraiser in the Last Year:   . Arboriculturist in the Last Year:   Transportation Needs:   . Film/video editor (Medical):   Marland Kitchen Lack of Transportation (Non-Medical):   Physical Activity:   . Days of Exercise per Week:   . Minutes of Exercise per Session:   Stress:   . Feeling of Stress :   Social Connections:   . Frequency of Communication with Friends and Family:   . Frequency of Social Gatherings with Friends and Family:   . Attends Religious Services:   . Active Member of Clubs or Organizations:   . Attends Archivist Meetings:   Marland Kitchen Marital Status:   Intimate Partner Violence:   . Fear of Current or Ex-Partner:   . Emotionally Abused:   Marland Kitchen Physically Abused:   . Sexually Abused:     Review of Systems:    Constitutional: No weight  loss, fever or chills Skin: No rash Cardiovascular: No chest pain   Respiratory: No SOB  Gastrointestinal: See HPI and otherwise negative Genitourinary: No dysuria  Neurological: No headache, dizziness or syncope Musculoskeletal: No new muscle or joint pain Hematologic: No bleeding  Psychiatric: No history of depression or anxiety   Physical Exam:  Vital signs: BP 126/64   Pulse 65   Temp 98.5 F (36.9 C)   Ht 5' (1.524 m)   Wt 139 lb (63 kg)   BMI 27.15 kg/m   Constitutional:   Pleasant Caucasian female appears to be in NAD, Well developed, Well nourished, alert and cooperative Head:  Normocephalic and atraumatic. Eyes:   PEERL, EOMI. No icterus. Conjunctiva pink. Ears:  Normal auditory acuity. Neck:  Supple Throat: Oral cavity and pharynx without inflammation, swelling or lesion.  Respiratory: Respirations even and unlabored. Lungs clear to auscultation bilaterally.   No wheezes, crackles, or rhonchi.  Cardiovascular: Normal S1, S2. No MRG. Regular rate and rhythm. No peripheral edema, cyanosis or pallor.  Gastrointestinal:  Soft, nondistended, nontender. No rebound or guarding. Normal bowel sounds. No appreciable masses or hepatomegaly. Rectal:  Not performed.  Msk:  Symmetrical without gross deformities. Without edema, no deformity or joint abnormality.  Neurologic:  Alert and  oriented x4;  grossly normal neurologically.  Skin:   Dry and intact without significant lesions or rashes. Psychiatric: Demonstrates good judgement and reason without abnormal affect or behaviors.  Requesting recent labs from PCP.  Assessment: 1.  Positive Hemoccult: 5 months ago per patient, we are requesting records; consider polyp versus hemorrhoids versus other 2.  History of adenomatous polyps: Last colonoscopy in 2017  Plan: 1.  Scheduled the patient for a diagnostic colonoscopy in the [facility] with Dr. Loletha Carrow.  Did discuss risks, benefits, limitations and alternatives and the patient agrees to  proceed. 2.  Requesting records from primary care provider. 3.  Patient to follow in clinic per recommendations from Dr. Loletha Carrow after time of procedure.  Ellouise Newer, PA-C [facility] 11/08/2019, 2:18 PM  Cc: Crist Infante, MD   Addendum: 11/09/2019 908  03/29/2019 + hemosure  No change to plans  Ellouise Newer, PA-C

## 2019-11-09 ENCOUNTER — Telehealth: Payer: Self-pay | Admitting: Physician Assistant

## 2019-11-09 NOTE — Telephone Encounter (Signed)
Returned call to patient.  She was asking for an alternative prep due to taste.  She has already purchased Suprep.  Offered advice on things she can do to help improve taste..  Patient receptive and agreed to go forward with Suprep.

## 2019-11-10 ENCOUNTER — Encounter: Payer: Self-pay | Admitting: Gastroenterology

## 2019-11-10 ENCOUNTER — Other Ambulatory Visit: Payer: Self-pay

## 2019-11-10 ENCOUNTER — Ambulatory Visit (AMBULATORY_SURGERY_CENTER): Payer: Medicare Other | Admitting: Gastroenterology

## 2019-11-10 VITALS — BP 125/76 | HR 18 | Temp 97.1°F | Resp 12 | Ht 60.0 in | Wt 139.0 lb

## 2019-11-10 DIAGNOSIS — R195 Other fecal abnormalities: Secondary | ICD-10-CM

## 2019-11-10 MED ORDER — SODIUM CHLORIDE 0.9 % IV SOLN: 500.0000 mL | INTRAVENOUS | Status: DC

## 2019-11-10 NOTE — Progress Notes (Signed)
Temp by LS and vitals by DT.

## 2019-11-10 NOTE — Op Note (Signed)
[place] Patient Name: Pamela Blair Procedure Date: 11/10/2019 1:19 PM MRN: [id number] Endoscopist: Mallie Mussel L. Loletha Carrow , MD Age: 79 Referring MD:  Date of Birth: 1941/02/26 Gender: Female Account #: [id number] Procedure:                Colonoscopy Indications:              Heme positive stool (routine test - asymptomatic,                            diminutive adenoma 10/2015) Medicines:                Monitored Anesthesia Care Procedure:                Pre-Anesthesia Assessment:                           - Prior to the procedure, a History and Physical                            was performed, and patient medications and                            allergies were reviewed. The patient's tolerance of                            previous anesthesia was also reviewed. The risks                            and benefits of the procedure and the sedation                            options and risks were discussed with the patient.                            All questions were answered, and informed consent                            was obtained. Prior Anticoagulants: The patient has                            taken no previous anticoagulant or antiplatelet                            agents. ASA Grade Assessment: II - A patient with                            mild systemic disease. After reviewing the risks                            and benefits, the patient was deemed in                            satisfactory condition to undergo the procedure.  After obtaining informed consent, the colonoscope                            was passed under direct vision. Throughout the                            procedure, the patient's blood pressure, pulse, and                            oxygen saturations were monitored continuously. The                            Colonoscope was introduced through the anus and                            advanced to the the terminal ileum,  with                            identification of the appendiceal orifice and IC                            valve. The colonoscopy was performed without                            difficulty. The patient tolerated the procedure                            well. The quality of the bowel preparation was                            excellent. The terminal ileum, ileocecal valve,                            appendiceal orifice, and rectum were photographed.                            The quality of the bowel preparation was evaluated                            using the BBPS [facility] Bowel Preparation Scale)                            with scores of: Right Colon = 3, Transverse Colon =                            3 and Left Colon = 3. The total BBPS score equals                            9. The bowel preparation used was SUPREP. Scope In: 1:29:20 PM Scope Out: 1:41:03 PM Scope Withdrawal Time: 0 hours 7 minutes 5 seconds  Total Procedure Duration: 0 hours 11 minutes 43 seconds  Findings:  The perianal and digital rectal examinations were                            normal.                           The terminal ileum appeared normal.                           The entire examined colon appeared normal on direct                            and retroflexion views. Complications:            No immediate complications. Estimated Blood Loss:     Estimated blood loss: none. Impression:               - The examined portion of the ileum was normal.                           - The entire examined colon is normal on direct and                            retroflexion views.                           - No specimens collected. Recommendation:           - Patient has a contact number available for                            emergencies. The signs and symptoms of potential                            delayed complications were discussed with the                            patient. Return to normal  activities tomorrow.                            Written discharge instructions were provided to the                            patient.                           - Resume previous diet.                           - Continue present medications.                           - Based on current guidelines, no repeat                            screening/surveillance colonoscopy or annual FOBT  indicated. Malakhi Markwood L. Loletha Carrow, MD 11/10/2019 1:45:51 PM This report has been signed electronically.

## 2019-11-10 NOTE — Progress Notes (Signed)
____________________________________________________________  Attending physician addendum:  Thank you for sending this case to me. I have reviewed the entire note, and the outlined plan seems appropriate.  Notwithstanding question of indication for routine FOBT in patient < 5 years out from screening colonoscopy, positive result warrants diagnostic colonoscopy.  Wilfrid Lund, MD  ____________________________________________________________

## 2019-11-10 NOTE — Patient Instructions (Signed)
YOU HAD AN ENDOSCOPIC PROCEDURE TODAY AT THE [facility]:   Refer to the procedure report that was given to you for any specific questions about what was found during the examination.  If the procedure report does not answer your questions, please call your gastroenterologist to clarify.  If you requested that your care partner not be given the details of your procedure findings, then the procedure report has been included in a sealed envelope for you to review at your convenience later.  YOU SHOULD EXPECT: Some feelings of bloating in the abdomen. Passage of more gas than usual.  Walking can help get rid of the air that was put into your GI tract during the procedure and reduce the bloating. If you had a lower endoscopy (such as a colonoscopy or flexible sigmoidoscopy) you may notice spotting of blood in your stool or on the toilet paper. If you underwent a bowel prep for your procedure, you may not have a normal bowel movement for a few days.  Please Note:  You might notice some irritation and congestion in your nose or some drainage.  This is from the oxygen used during your procedure.  There is no need for concern and it should clear up in a day or so.  SYMPTOMS TO REPORT IMMEDIATELY:   Following lower endoscopy (colonoscopy or flexible sigmoidoscopy):  Excessive amounts of blood in the stool  Significant tenderness or worsening of abdominal pains  Swelling of the abdomen that is new, acute  Fever of 100F or higher  For urgent or emergent issues, a gastroenterologist can be reached at any hour by calling [phone]. Do not use MyChart messaging for urgent concerns.    DIET:  We do recommend a small meal at first, but then you may proceed to your regular diet.  Drink plenty of fluids but you should avoid alcoholic beverages for 24 hours.  ACTIVITY:  You should plan to take it easy for the rest of today and you should NOT DRIVE or use heavy machinery until tomorrow (because  of the sedation medicines used during the test).    FOLLOW UP: Our staff will call the number listed on your records 48-72 hours following your procedure to check on you and address any questions or concerns that you may have regarding the information given to you following your procedure. If we do not reach you, we will leave a message.  We will attempt to reach you two times.  During this call, we will ask if you have developed any symptoms of COVID 19. If you develop any symptoms (ie: fever, flu-like symptoms, shortness of breath, cough etc.) before then, please call [phone].  If you test positive for Covid 19 in the 2 weeks post procedure, please call and report this information to us.    If any biopsies were taken you will be contacted by phone or by letter within the next 1-3 weeks.  Please call us at [phone] if you have not heard about the biopsies in 3 weeks.    SIGNATURES/CONFIDENTIALITY: You and/or your care partner have signed paperwork which will be entered into your electronic medical record.  These signatures attest to the fact that that the information above on your After Visit Summary has been reviewed and is understood.  Full responsibility of the confidentiality of this discharge information lies with you and/or your care-partner. 

## 2019-11-10 NOTE — Progress Notes (Signed)
A/ox3, pleased with MAC, report to RN 

## 2019-11-14 ENCOUNTER — Telehealth: Payer: Self-pay

## 2019-11-14 ENCOUNTER — Telehealth: Payer: Self-pay | Admitting: *Deleted

## 2019-11-14 NOTE — Telephone Encounter (Signed)
  Follow up Call-  Call back number 11/10/2019 11/10/2019  Post procedure Call Back phone  # in recovery rm pt asked that we call [phone] for call back on Tuesday [phone]  Permission to leave phone message - Yes  Some recent data might be hidden     Patient questions:  Do you have a fever, pain , or abdominal swelling? No. Pain Score  0 *  Have you tolerated food without any problems? Yes.    Have you been able to return to your normal activities? Yes.    Do you have any questions about your discharge instructions: Diet   No. Medications  No. Follow up visit  No.  Do you have questions or concerns about your Care? No.  Actions: * If pain score is 4 or above: No action needed, pain <4  1. Have you developed a fever since your procedure? NO  2.   Have you had an respiratory symptoms (SOB or cough) since your procedure? NO  3.   Have you tested positive for COVID 19 since your procedure NO  4.   Have you had any family members/close contacts diagnosed with the COVID 19 since your procedure?  NO   If yes to any of these questions please route to Joylene John, RN and Erenest Rasher, RN

## 2019-11-14 NOTE — Telephone Encounter (Signed)
  Follow up Call-  Call back number 11/10/2019 11/10/2019  Post procedure Call Back phone  # in recovery rm pt asked that we call [phone] for call back on Tuesday [phone]  Permission to leave phone message - Yes  Some recent data might be hidden     Left message

## 2019-11-16 ENCOUNTER — Ambulatory Visit (HOSPITAL_COMMUNITY): Payer: Medicare Other

## 2019-11-24 ENCOUNTER — Telehealth (HOSPITAL_COMMUNITY): Payer: Self-pay | Admitting: *Deleted

## 2019-11-24 ENCOUNTER — Telehealth: Payer: Self-pay | Admitting: Internal Medicine

## 2019-11-24 NOTE — Telephone Encounter (Signed)
Reaching out to patient to offer assistance regarding upcoming cardiac imaging study; pt verbalizes understanding of appt date/time, parking situation and where to check in, pre-test NPO status and medications ordered, and verified current allergies; name and call back number provided for further questions should they arise  Courtney Fenlon Tai RN Navigator Cardiac Imaging [facility] [phone] office [phone] cell 

## 2019-11-24 NOTE — Telephone Encounter (Signed)
Called patient to review cardiac CT instructions Also sent via MyChart - her son checks her portal and will be the one taking her to test She will have BMET today

## 2019-11-25 LAB — BASIC METABOLIC PANEL
BUN/Creatinine Ratio: 21 (ref 12–28)
BUN: 14 mg/dL (ref 8–27)
CO2: 25 mmol/L (ref 20–29)
Calcium: 9.5 mg/dL (ref 8.7–10.3)
Chloride: 102 mmol/L (ref 96–106)
Creatinine, Ser: 0.68 mg/dL (ref 0.57–1.00)
GFR calc Af Amer: 98 mL/min/{1.73_m2} (ref >59)
GFR calc non Af Amer: 85 mL/min/{1.73_m2} (ref >59)
Glucose: 87 mg/dL (ref 65–99)
Potassium: 4.8 mmol/L (ref 3.5–5.2)
Sodium: 140 mmol/L (ref 134–144)

## 2019-11-27 ENCOUNTER — Ambulatory Visit (HOSPITAL_COMMUNITY)
Admission: RE | Admit: 2019-11-27 | Discharge: 2019-11-27 | Disposition: A | Discharge status: Home / Self Care | Payer: Medicare Other | Source: Ambulatory Visit | Attending: Internal Medicine | Admitting: Internal Medicine

## 2019-11-27 ENCOUNTER — Other Ambulatory Visit: Payer: Self-pay

## 2019-11-27 DIAGNOSIS — R072 Precordial pain: Secondary | ICD-10-CM | POA: Diagnosis not present

## 2019-11-27 DIAGNOSIS — I2510 Atherosclerotic heart disease of native coronary artery without angina pectoris: Secondary | ICD-10-CM | POA: Insufficient documentation

## 2019-11-27 DIAGNOSIS — R0602 Shortness of breath: Secondary | ICD-10-CM | POA: Insufficient documentation

## 2019-11-27 DIAGNOSIS — I700 Atherosclerosis of aorta: Secondary | ICD-10-CM | POA: Insufficient documentation

## 2019-11-27 MED ORDER — IOHEXOL 350 MG/ML SOLN
80.0000 mL | Freq: Once | INTRAVENOUS | Status: AC | PRN
  Administered 2019-11-27: 80 mL via INTRAVENOUS

## 2019-11-27 MED ORDER — NITROGLYCERIN 0.4 MG SL SUBL
SUBLINGUAL_TABLET | SUBLINGUAL | Status: AC
  Filled 2019-11-27: qty 2

## 2019-11-27 MED ORDER — NITROGLYCERIN 0.4 MG SL SUBL
0.8000 mg | SUBLINGUAL_TABLET | Freq: Once | SUBLINGUAL | Status: AC
  Administered 2019-11-27: 0.8 mg via SUBLINGUAL

## 2019-11-28 DIAGNOSIS — R931 Abnormal findings on diagnostic imaging of heart and coronary circulation: Secondary | ICD-10-CM | POA: Diagnosis not present

## 2019-11-28 DIAGNOSIS — R0602 Shortness of breath: Secondary | ICD-10-CM | POA: Diagnosis not present

## 2019-11-28 DIAGNOSIS — I2510 Atherosclerotic heart disease of native coronary artery without angina pectoris: Secondary | ICD-10-CM | POA: Diagnosis not present

## 2019-11-28 DIAGNOSIS — R079 Chest pain, unspecified: Secondary | ICD-10-CM | POA: Diagnosis not present

## 2019-11-30 ENCOUNTER — Other Ambulatory Visit: Payer: Self-pay

## 2019-11-30 ENCOUNTER — Encounter: Payer: Self-pay | Admitting: Medical

## 2019-11-30 ENCOUNTER — Ambulatory Visit (INDEPENDENT_AMBULATORY_CARE_PROVIDER_SITE_OTHER): Payer: Medicare Other | Admitting: Medical

## 2019-11-30 VITALS — BP 145/81 | HR 75 | Temp 97.9°F | Resp 18 | Ht 60.0 in | Wt 138.6 lb

## 2019-11-30 DIAGNOSIS — I25110 Atherosclerotic heart disease of native coronary artery with unstable angina pectoris: Secondary | ICD-10-CM

## 2019-11-30 DIAGNOSIS — R030 Elevated blood-pressure reading, without diagnosis of hypertension: Secondary | ICD-10-CM | POA: Diagnosis not present

## 2019-11-30 DIAGNOSIS — E785 Hyperlipidemia, unspecified: Secondary | ICD-10-CM | POA: Diagnosis not present

## 2019-11-30 DIAGNOSIS — R011 Cardiac murmur, unspecified: Secondary | ICD-10-CM | POA: Diagnosis not present

## 2019-11-30 MED ORDER — ATORVASTATIN CALCIUM 40 MG PO TABS
40.0000 mg | ORAL_TABLET | Freq: Every day | ORAL | 3 refills | Status: DC
Start: 2019-11-30 — End: 2019-12-05

## 2019-11-30 MED ORDER — NITROGLYCERIN 0.4 MG SL SUBL
0.4000 mg | SUBLINGUAL_TABLET | SUBLINGUAL | 3 refills | Status: AC | PRN
Start: 2019-11-30 — End: 2024-03-09

## 2019-11-30 NOTE — Progress Notes (Signed)
Cardiology Office Note   Date:  12/01/2019   ID:  Pamela, Blair 06/12/1941, MRN [id number]  PCP:  Crist Infante, MD  Cardiologist:  Pixie Casino, MD EP: None  Chief Complaint  Patient presents with  . Follow-up    Abnormal coronary CTA      History of Present Illness: Pamela Blair is a 79 y.o. female with PMH of HLD and recent diagnosis of CAD on a coronary CTA, who presents for follow-up to discuss her coronary CTA results.  She was last evaluated by cardiology at an outpatient visit with Dr. Debara Pickett 10/23/19, at which time she reported DOE and fatigue without significant chest pain, and was under increased stress following the death of her husband a few months earlier due to COVID-19. She was recommended to undergo a coronary CTA, which occurred 11/27/19 and revealed significant flow limiting p-mLAD disease and possibly the mLCx. She was recommended to undergo a cardiac catheterization, for which this visit was scheduled.  She presents today with her son for follow-up of her coronary CTA. We discussed her results in detail and next steps to evaluate these findings. Thankfully she has not had any recurrent chest pain. She has some SOB which has been stable over the past week. No complaints of dizziness, lightheadedness, syncope, palpitations, orthopnea, PND, or LE edema. She reports prior intolerance to pravastatin due to myalgias. BP has been stable with SBP in the 120s at home. She recently had positive hemoccult stools and underwent a colonoscopy which was normal. No complaints of hematuria, melena, or hematochezia.    Past Medical History:  Diagnosis Date  . Blood transfusion without reported diagnosis   . Cervical spinal stenosis   . Cervical spondylosis   . Hyperlipidemia   . Osteoporosis     Past Surgical History:  Procedure Laterality Date  . ABDOMINAL HYSTERECTOMY     and BSO, dx- dysfunctional bleeding  . ANTERIOR CERVICAL DECOMP/DISCECTOMY FUSION   10/3-10/2010   Dr Pamala Hurry , [place] , [place]   . CESAREAN SECTION     x 4  . COLONOSCOPY     Dr Deatra Ina  . 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 TOTAL KNEE ARTHROPLASTY       Current Outpatient Medications  Medication Sig Dispense Refill  . Calcium Carbonate Antacid (CALCIUM CARBONATE PO) Take 1,000 mg by mouth daily.     . Cholecalciferol (VITAMIN D3) 2000 UNITS capsule Take 1 capsule (2,000 Units total) by mouth daily. 100 capsule 3  . ezetimibe (ZETIA) 10 MG tablet Take 10 mg by mouth at bedtime.     Marland Kitchen omega-3 fish oil (MAXEPA) 1000 MG CAPS capsule Take 3,000 mg by mouth daily.     Marland Kitchen acetaminophen (TYLENOL) 500 MG tablet Take 500-1,000 mg by mouth every 6 (six) hours as needed for mild pain or moderate pain.     Marland Kitchen aspirin EC 81 MG tablet Take 81 mg by mouth at bedtime.     Marland Kitchen atorvastatin (LIPITOR) 40 MG tablet Take 1 tablet (40 mg total) by mouth daily. (Patient taking differently: Take 40 mg by mouth at bedtime. ) 90 tablet 3  . b complex vitamins tablet Take 1 tablet by mouth daily.    . bimatoprost (LUMIGAN) 0.01 % SOLN Place 1 drop into both eyes at bedtime.    Marland Kitchen CALCIUM CITRATE PO Take 1,000 mg by mouth daily.    . Coenzyme Q10 (  COQ10 PO) Take 20 mg by mouth daily at 12 noon.   0  . diclofenac Sodium (VOLTAREN) 1 % GEL Apply 2 g topically at bedtime as needed (Hip Pain).    . Glycerin-Hypromellose-PEG 400 (DRY EYE RELIEF DROPS) 0.2-0.2-1 % SOLN Place 1 drop into both eyes daily as needed (Dry eye).    Marland Kitchen MAGNESIUM PO Take 1 tablet by mouth daily.     . nitroGLYCERIN (NITROSTAT) 0.4 MG SL tablet Place 1 tablet (0.4 mg total) under the tongue every 5 (five) minutes as needed for chest pain. 25 tablet 3  . TURMERIC PO Take 1 capsule by mouth daily at 12 noon.     No current facility-administered medications for this visit.    Allergies:   Niacin, Clarithromycin, Neomycin-bacitracin-polymyxin  [bacitracin-neomycin-polymyxin], and Neomycin      Social History:  The patient  reports that she has never smoked. She has never used smokeless tobacco. She reports that she does not drink alcohol or use drugs.   Family History:  The patient's family history includes Heart attack (age of onset: 74) in her father; Stroke (age of onset: 29) in her mother.    ROS:  Please see the history of present illness.   Otherwise, review of systems are positive for none.   All other systems are reviewed and negative.    PHYSICAL EXAM: VS:  BP (!) 145/81   Pulse 75   Temp 97.9 F (36.6 C)   Resp 18   Ht 5' (1.524 m)   Wt 138 lb 9.6 oz (62.9 kg)   SpO2 99%   BMI 27.07 kg/m  , BMI Body mass index is 27.07 kg/m. GEN: Well nourished, well developed, in no acute distress HEENT: sclera anicteric Neck: no JVD, carotid bruits, or masses Cardiac: RRR; +murmur, no rubs or gallops, no edema  Respiratory:  clear to auscultation bilaterally, normal work of breathing GI: soft, nontender, nondistended, + BS MS: no deformity or atrophy Skin: warm and dry, no rash Neuro:  Strength and sensation are intact Psych: euthymic mood, full affect   EKG:  EKG is ordered today. The ekg ordered today demonstrates sinus rhythm with rate 74 bpm, non-specific ST abnormalities seen on previous, no STE   Recent Labs: 11/30/2019: BUN 14; Creatinine, Ser 0.71; Hemoglobin 9.1; Platelets 373; Potassium 4.8; Sodium 138    Lipid Panel    Component Value Date/Time   CHOL 204 (H) 10/17/2013 0934   TRIG 60.0 10/17/2013 0934   HDL 58.10 10/17/2013 0934   CHOLHDL 4 10/17/2013 0934   VLDL 12.0 10/17/2013 0934   LDLCALC 134 (H) 10/17/2013 0934   LDLDIRECT 158.8 11/11/2012 0935      Wt Readings from Last 3 Encounters:  11/30/19 138 lb 9.6 oz (62.9 kg)  11/10/19 139 lb (63 kg)  11/08/19 139 lb (63 kg)      Other studies Reviewed: Additional studies/ records that were reviewed today include:   Coronary CTA 11/27/19: IMPRESSION: 1. At least moderate mixed  proximal LAD stenosis, CADRADS = 3. CT FFR will be performed and reported separately.  2. Coronary calcium score of 901. This was 92nd percentile for age and sex matched control.  3. Normal coronary origin with right dominance.  4. Aortic and mitral annular calcification.  5. Extensive aortic atherosclerosis.  FFR IMPRESSION: 1. CT FFR demonstrates significant flow limitation in the proximal to mid-LAD and possibly the mid-LCx. Borderline flow limitation in the D1 vessel.  2. Cardiac catheterization is recommended based on findings  and symptom pattern.    ASSESSMENT AND PLAN:  1. Coronary CTA c/w obstructive CAD: imaging c/f for obstructive LAD and possibly LCx disease.  - Will plan for LHC to further evaluate findings.  - The patient understands that risks included but are not limited to stroke (1 in 1000), death (1 in 75), kidney failure [usually temporary] (1 in 500), bleeding (1 in 200), allergic reaction [possibly serious] (1 in 200).  The patient understands and agrees to proceed.  - Will start aspirin 81mg  daily - Will send Rx for SL nitro  2. HLD: LDL 150 03/2019; goal LDL <70. Previously intolerant to pravastatin - Will trial atorvastatin 40mg  daily for aggressive risk factor modifications. If she fails, will consider a referral to the lipid clinic for PSK9i consideration - Continue zetia and omega 3 - Will need a repeat FLP/LFTs in 6-8 weeks if she tolerates atorvastatin  3. Murmur: noted on exam today; heard best at right upper sternal border.  - Will check an echocardiogram - likely do be completed following her cardiac catheterization  4. Elevated blood pressure without diagnosis of HTN: SBP is often in the 120s at home.  - If obstructive CAD noted on her cardiac cath, would consider addition of a BBlocker for risk factor modifications.    Current medicines are reviewed at length with the patient today.  The patient does not have concerns regarding  medicines.  The following changes have been made:  As above  Labs/ tests ordered today include:   Orders Placed This Encounter  Procedures  . Basic metabolic panel  . CBC w/Diff/Platelet  . PT and PTT  . EKG 12-Lead  . ECHOCARDIOGRAM COMPLETE     Disposition:   FU with Dr. Rennie Plowman 1-2 weeks following cardiac cath.  Signed, Abigail Butts, PA-C  12/01/2019 8:46 AM

## 2019-11-30 NOTE — Patient Instructions (Addendum)
Medication Instructions:  Continue same medications *If you need a refill on your cardiac medications before your next appointment, please call your pharmacy*   Lab Work: Bmet,cbc,pt today    Testing/Procedures: Schedule Echo  Cardiac Cath  See instructions below   Follow-Up: At [facility], you and your health needs are our priority.  As part of our continuing mission to provide you with exceptional heart care, we have created designated Provider Care Teams.  These Care Teams include your primary Cardiologist (physician) and Advanced Practice Providers (APPs -  Physician Assistants and Nurse Practitioners) who all work together to provide you with the care you need, when you need it.  We recommend signing up for the patient portal called "MyChart".  Sign up information is provided on this After Visit Summary.  MyChart is used to connect with patients for Virtual Visits (Telemedicine).  Patients are able to view lab/test results, encounter notes, upcoming appointments, etc.  Non-urgent messages can be sent to your provider as well.   To learn more about what you can do with MyChart, go to [email or web address].    Your next appointment: Tuesday 6/8 at 2:45 pm   The format for your next appointment: Office    Provider: Almyra Deforest PA            [address] [place] [address] Dept: [phone] Loc: [place]  11/30/2019  You are scheduled for a Cardiac Cath on Tuesday 12/05/19 with Dr.Kelly.  1. Please arrive at the [facility] (Main Entrance A) at [facility]: [address] [place], [place] [address] at 10:00 am (This time is two hours before your procedure to ensure your preparation). Free valet parking service is available.   Special note: Every effort is made to have your procedure done on time. Please understand that emergencies  sometimes delay scheduled procedures.  2. Diet: Do not eat solid foods after midnight.  The patient may have clear liquids until 5am upon the day of the procedure.  3. Labs: You will need to have blood drawn on Thursday 5/27 You do not need to be fasting.  4. Medication instructions in preparation for your procedure:     On the morning of your procedure, take Aspirin 81 mg and any morning medicines NOT listed above.  You may use sips of water.  5. Plan for one night stay--bring personal belongings. 6. Bring a current list of your medications and current insurance cards. 7. You MUST have a responsible person to drive you home. 8. Someone MUST be with you the first 24 hours after you arrive home or your discharge will be delayed. 9. Please wear clothes that are easy to get on and off and wear slip-on shoes.  Thank you for allowing [place] to care for you!   -- [facility]

## 2019-12-01 ENCOUNTER — Other Ambulatory Visit (HOSPITAL_COMMUNITY)
Admission: RE | Admit: 2019-12-01 | Discharge: 2019-12-01 | Disposition: A | Discharge status: Home / Self Care | Payer: Medicare Other | Source: Ambulatory Visit | Attending: Cardiovascular Disease | Admitting: Cardiovascular Disease

## 2019-12-01 ENCOUNTER — Encounter: Payer: Self-pay | Admitting: Medical

## 2019-12-01 ENCOUNTER — Telehealth: Payer: Self-pay | Admitting: Medical

## 2019-12-01 DIAGNOSIS — Z20822 Contact with and (suspected) exposure to covid-19: Secondary | ICD-10-CM | POA: Diagnosis not present

## 2019-12-01 DIAGNOSIS — Z01812 Encounter for preprocedural laboratory examination: Secondary | ICD-10-CM | POA: Insufficient documentation

## 2019-12-01 LAB — CBC WITH DIFFERENTIAL/PLATELET
Basophils Absolute: 0.1 10*3/uL (ref 0.0–0.2)
Basos: 1 %
EOS (ABSOLUTE): 0.2 10*3/uL (ref 0.0–0.4)
Eos: 4 %
Hematocrit: 29.9 % — ABNORMAL LOW (ref 34.0–46.6)
Hemoglobin: 9.1 g/dL — ABNORMAL LOW (ref 11.1–15.9)
Immature Grans (Abs): 0 10*3/uL (ref 0.0–0.1)
Immature Granulocytes: 0 %
Lymphocytes Absolute: 2.8 10*3/uL (ref 0.7–3.1)
Lymphs: 42 %
MCH: 23.6 pg — ABNORMAL LOW (ref 26.6–33.0)
MCHC: 30.4 g/dL — ABNORMAL LOW (ref 31.5–35.7)
MCV: 78 fL — ABNORMAL LOW (ref 79–97)
Monocytes Absolute: 0.5 10*3/uL (ref 0.1–0.9)
Monocytes: 7 %
Neutrophils Absolute: 3 10*3/uL (ref 1.4–7.0)
Neutrophils: 46 %
Platelets: 373 10*3/uL (ref 150–450)
RBC: 3.86 x10E6/uL (ref 3.77–5.28)
RDW: 14.9 % (ref 11.7–15.4)
WBC: 6.6 10*3/uL (ref 3.4–10.8)

## 2019-12-01 LAB — BASIC METABOLIC PANEL
BUN/Creatinine Ratio: 20 (ref 12–28)
BUN: 14 mg/dL (ref 8–27)
CO2: 22 mmol/L (ref 20–29)
Calcium: 9.6 mg/dL (ref 8.7–10.3)
Chloride: 103 mmol/L (ref 96–106)
Creatinine, Ser: 0.71 mg/dL (ref 0.57–1.00)
GFR calc Af Amer: 95 mL/min/{1.73_m2} (ref >59)
GFR calc non Af Amer: 82 mL/min/{1.73_m2} (ref >59)
Glucose: 100 mg/dL — ABNORMAL HIGH (ref 65–99)
Potassium: 4.8 mmol/L (ref 3.5–5.2)
Sodium: 138 mmol/L (ref 134–144)

## 2019-12-01 LAB — SARS CORONAVIRUS 2 (TAT 6-24 HRS): SARS Coronavirus 2: NEGATIVE

## 2019-12-01 LAB — PT AND PTT
INR: 1 (ref 0.9–1.2)
Prothrombin Time: 10.5 s (ref 9.1–12.0)
aPTT: 24 s (ref 24–33)

## 2019-12-01 NOTE — Telephone Encounter (Signed)
   Pt's son calling, they would like to know if the 5:30 am arrival and 7:30 surgery time is still available. They would like to move to earlier time

## 2019-12-01 NOTE — Telephone Encounter (Signed)
Spoke with patient's son. Confirmed with Santiago Glad at the cath lab, no earlier time slots available. Son will have patient at hospital for procedure as planned previously.

## 2019-12-05 ENCOUNTER — Telehealth: Payer: Self-pay | Admitting: Medical

## 2019-12-05 ENCOUNTER — Encounter (HOSPITAL_COMMUNITY)
Admission: RE | Disposition: A | Discharge status: Home / Self Care | Payer: Self-pay | Source: Home / Self Care | Attending: Cardiovascular Disease

## 2019-12-05 ENCOUNTER — Other Ambulatory Visit: Payer: Self-pay

## 2019-12-05 ENCOUNTER — Ambulatory Visit (HOSPITAL_COMMUNITY)
Admission: RE | Admit: 2019-12-05 | Discharge: 2019-12-05 | Disposition: A | Discharge status: Home / Self Care | Payer: Medicare Other | Attending: Cardiovascular Disease | Admitting: Cardiovascular Disease

## 2019-12-05 DIAGNOSIS — Z79899 Other long term (current) drug therapy: Secondary | ICD-10-CM | POA: Insufficient documentation

## 2019-12-05 DIAGNOSIS — M810 Age-related osteoporosis without current pathological fracture: Secondary | ICD-10-CM | POA: Insufficient documentation

## 2019-12-05 DIAGNOSIS — E785 Hyperlipidemia, unspecified: Secondary | ICD-10-CM | POA: Insufficient documentation

## 2019-12-05 DIAGNOSIS — R030 Elevated blood-pressure reading, without diagnosis of hypertension: Secondary | ICD-10-CM | POA: Diagnosis not present

## 2019-12-05 DIAGNOSIS — I2510 Atherosclerotic heart disease of native coronary artery without angina pectoris: Secondary | ICD-10-CM | POA: Diagnosis not present

## 2019-12-05 DIAGNOSIS — Z7982 Long term (current) use of aspirin: Secondary | ICD-10-CM | POA: Diagnosis not present

## 2019-12-05 DIAGNOSIS — R931 Abnormal findings on diagnostic imaging of heart and coronary circulation: Secondary | ICD-10-CM | POA: Diagnosis present

## 2019-12-05 HISTORY — PX: LEFT HEART CATH AND CORONARY ANGIOGRAPHY: CATH118249

## 2019-12-05 LAB — CBC
HCT: 30.1 % — ABNORMAL LOW (ref 36.0–46.0)
Hemoglobin: 8.7 g/dL — ABNORMAL LOW (ref 12.0–15.0)
MCH: 23.3 pg — ABNORMAL LOW (ref 26.0–34.0)
MCHC: 28.9 g/dL — ABNORMAL LOW (ref 30.0–36.0)
MCV: 80.7 fL (ref 80.0–100.0)
Platelets: 333 10*3/uL (ref 150–400)
RBC: 3.73 MIL/uL — ABNORMAL LOW (ref 3.87–5.11)
RDW: 17.2 % — ABNORMAL HIGH (ref 11.5–15.5)
WBC: 5.3 10*3/uL (ref 4.0–10.5)
nRBC: 0 % (ref 0.0–0.2)

## 2019-12-05 SURGERY — LEFT HEART CATH AND CORONARY ANGIOGRAPHY
Anesthesia: LOCAL

## 2019-12-05 MED ORDER — VERAPAMIL HCL 2.5 MG/ML IV SOLN
INTRAVENOUS | Status: AC
  Filled 2019-12-05: qty 2

## 2019-12-05 MED ORDER — SODIUM CHLORIDE 0.9% FLUSH: 3.0000 mL | INTRAVENOUS | Status: DC | PRN

## 2019-12-05 MED ORDER — ACETAMINOPHEN 325 MG PO TABS: 650.0000 mg | ORAL_TABLET | ORAL | Status: DC | PRN

## 2019-12-05 MED ORDER — METOPROLOL SUCCINATE ER 25 MG PO TB24
25.0000 mg | ORAL_TABLET | Freq: Every day | ORAL | 3 refills | Status: DC
Start: 2019-12-05 — End: 2020-02-28

## 2019-12-05 MED ORDER — SODIUM CHLORIDE 0.9 % IV SOLN: 250.0000 mL | INTRAVENOUS | Status: DC | PRN

## 2019-12-05 MED ORDER — ASPIRIN 81 MG PO CHEW: 81.0000 mg | CHEWABLE_TABLET | Freq: Every day | ORAL | Status: DC

## 2019-12-05 MED ORDER — ASPIRIN 81 MG PO CHEW: 81.0000 mg | CHEWABLE_TABLET | ORAL | Status: DC

## 2019-12-05 MED ORDER — MIDAZOLAM HCL 2 MG/2ML IJ SOLN
INTRAMUSCULAR | Status: DC | PRN
  Administered 2019-12-05 (×2): 1 mg via INTRAVENOUS

## 2019-12-05 MED ORDER — AMLODIPINE BESYLATE 5 MG PO TABS
5.0000 mg | ORAL_TABLET | Freq: Every day | ORAL | 6 refills | Status: DC
Start: 2019-12-05 — End: 2020-02-22

## 2019-12-05 MED ORDER — AMLODIPINE BESYLATE 5 MG PO TABS
5.0000 mg | ORAL_TABLET | Freq: Every day | ORAL | Status: DC
  Filled 2019-12-05: qty 1

## 2019-12-05 MED ORDER — SODIUM CHLORIDE 0.9% FLUSH: 3.0000 mL | Freq: Two times a day (BID) | INTRAVENOUS | Status: DC

## 2019-12-05 MED ORDER — HEPARIN (PORCINE) IN NACL 1000-0.9 UT/500ML-% IV SOLN
INTRAVENOUS | Status: DC | PRN
  Administered 2019-12-05 (×2): 500 mL

## 2019-12-05 MED ORDER — LABETALOL HCL 5 MG/ML IV SOLN: 10.0000 mg | INTRAVENOUS | Status: DC | PRN

## 2019-12-05 MED ORDER — VERAPAMIL HCL 2.5 MG/ML IV SOLN
INTRAVENOUS | Status: DC | PRN
  Administered 2019-12-05: 10 mL via INTRA_ARTERIAL

## 2019-12-05 MED ORDER — HEPARIN SODIUM (PORCINE) 1000 UNIT/ML IJ SOLN
INTRAMUSCULAR | Status: AC
  Filled 2019-12-05: qty 1

## 2019-12-05 MED ORDER — FENTANYL CITRATE (PF) 100 MCG/2ML IJ SOLN
INTRAMUSCULAR | Status: DC | PRN
  Administered 2019-12-05 (×2): 25 ug via INTRAVENOUS

## 2019-12-05 MED ORDER — SODIUM CHLORIDE 0.9 % WEIGHT BASED INFUSION: 1.0000 mL/kg/h | INTRAVENOUS | Status: DC

## 2019-12-05 MED ORDER — LIDOCAINE HCL (PF) 1 % IJ SOLN
INTRAMUSCULAR | Status: DC | PRN
  Administered 2019-12-05: 2 mL via INTRADERMAL

## 2019-12-05 MED ORDER — ONDANSETRON HCL 4 MG/2ML IJ SOLN: 4.0000 mg | Freq: Four times a day (QID) | INTRAMUSCULAR | Status: DC | PRN

## 2019-12-05 MED ORDER — HEPARIN SODIUM (PORCINE) 1000 UNIT/ML IJ SOLN
INTRAMUSCULAR | Status: DC | PRN
  Administered 2019-12-05: 3100 [IU] via INTRAVENOUS

## 2019-12-05 MED ORDER — ATORVASTATIN CALCIUM 80 MG PO TABS
80.0000 mg | ORAL_TABLET | Freq: Every day | ORAL | Status: DC
Start: 2019-12-05 — End: 2019-12-05

## 2019-12-05 MED ORDER — ATORVASTATIN CALCIUM 80 MG PO TABS
80.0000 mg | ORAL_TABLET | Freq: Every day | ORAL | 3 refills | Status: DC
Start: 2019-12-05 — End: 2020-02-22

## 2019-12-05 MED ORDER — SODIUM CHLORIDE 0.9 % WEIGHT BASED INFUSION
3.0000 mL/kg/h | INTRAVENOUS | Status: DC
  Administered 2019-12-05: 3 mL/kg/h via INTRAVENOUS

## 2019-12-05 MED ORDER — LIDOCAINE HCL (PF) 1 % IJ SOLN
INTRAMUSCULAR | Status: AC
  Filled 2019-12-05: qty 30

## 2019-12-05 MED ORDER — FENTANYL CITRATE (PF) 100 MCG/2ML IJ SOLN
INTRAMUSCULAR | Status: AC
  Filled 2019-12-05: qty 2

## 2019-12-05 MED ORDER — HYDRALAZINE HCL 20 MG/ML IJ SOLN: 10.0000 mg | INTRAMUSCULAR | Status: DC | PRN

## 2019-12-05 MED ORDER — HEPARIN (PORCINE) IN NACL 1000-0.9 UT/500ML-% IV SOLN
INTRAVENOUS | Status: AC
  Filled 2019-12-05: qty 1000

## 2019-12-05 MED ORDER — METOPROLOL SUCCINATE ER 25 MG PO TB24
25.0000 mg | ORAL_TABLET | Freq: Every day | ORAL | Status: DC
  Filled 2019-12-05: qty 1

## 2019-12-05 MED ORDER — MIDAZOLAM HCL 2 MG/2ML IJ SOLN
INTRAMUSCULAR | Status: AC
  Filled 2019-12-05: qty 2

## 2019-12-05 MED ORDER — SODIUM CHLORIDE 0.9 % IV SOLN: INTRAVENOUS | Status: DC

## 2019-12-05 SURGICAL SUPPLY — 12 items
CATH INFINITI JR4 5F (CATHETERS) ×1 IMPLANT
CATH OPTITORQUE TIG 4.0 5F (CATHETERS) ×1 IMPLANT
DEVICE RAD COMP TR BAND LRG (VASCULAR PRODUCTS) ×1 IMPLANT
GLIDESHEATH SLEND SS 6F .021 (SHEATH) ×1 IMPLANT
GUIDEWIRE INQWIRE 1.5J.035X260 (WIRE) IMPLANT
INQWIRE 1.5J .035X260CM (WIRE) ×2
KIT HEART LEFT (KITS) ×2 IMPLANT
PACK CARDIAC CATHETERIZATION (CUSTOM PROCEDURE TRAY) ×2 IMPLANT
SHEATH PROBE COVER 6X72 (BAG) ×1 IMPLANT
TRANSDUCER W/STOPCOCK (MISCELLANEOUS) ×2 IMPLANT
TUBING CIL FLEX 10 FLL-RA (TUBING) ×2 IMPLANT
WIRE HI TORQ VERSACORE-J 145CM (WIRE) ×1 IMPLANT

## 2019-12-05 NOTE — Discharge Instructions (Signed)
Please keep a log of your blood pressures to bring to your follow-up visit next week - check morning and night.    Please follow-up with your primary care provider for ongoing anemia work-up. You may need to be re-evaluated by your gastroenterologist     Drink plenty of fluids for 48 hours and keep wrist elevated at heart level for 24 hours  Radial Site Care   This sheet gives you information about how to care for yourself after your procedure. Your health care provider may also give you more specific instructions. If you have problems or questions, contact your health care provider. What can I expect after the procedure? After the procedure, it is common to have:  Bruising and tenderness at the catheter insertion area. Follow these instructions at home: Medicines  Take over-the-counter and prescription medicines only as told by your health care provider. Insertion site care 1. Follow instructions from your health care provider about how to take care of your insertion site. Make sure you: ? Wash your hands with soap and water before you change your bandage (dressing). If soap and water are not available, use hand sanitizer. ? Remove your dressing as told by your health care provider. In 24 hours 2. Check your insertion site every day for signs of infection. Check for: ? Redness, swelling, or pain. ? Fluid or blood. ? Pus or a bad smell. ? Warmth. 3. Do not take baths, swim, or use a hot tub until your health care provider approves. 4. You may shower 24-48 hours after the procedure, or as directed by your health care provider. ? Remove the dressing and gently wash the site with plain soap and water. ? Pat the area dry with a clean towel. ? Do not rub the site. That could cause bleeding. 5. Do not apply powder or lotion to the site. Activity   1. For 24 hours after the procedure, or as directed by your health care provider: ? Do not flex or bend the affected arm. ? Do not push  or pull heavy objects with the affected arm. ? Do not drive yourself home from the hospital or clinic. You may drive 24 hours after the procedure unless your health care provider tells you not to. ? Do not operate machinery or power tools. 2. Do not lift anything that is heavier than 10 lb (4.5 kg), or the limit that you are told, until your health care provider says that it is safe.  For 4 days 3. Ask your health care provider when it is okay to: ? Return to work or school. ? Resume usual physical activities or sports. ? Resume sexual activity. General instructions  If the catheter site starts to bleed, raise your arm and put firm pressure on the site. If the bleeding does not stop, get help right away. This is a medical emergency.  If you went home on the same day as your procedure, a responsible adult should be with you for the first 24 hours after you arrive home.  Keep all follow-up visits as told by your health care provider. This is important. Contact a health care provider if:  You have a fever.  You have redness, swelling, or yellow drainage around your insertion site. Get help right away if:  You have unusual pain at the radial site.  The catheter insertion area swells very fast.  The insertion area is bleeding, and the bleeding does not stop when you hold steady pressure on the area.  Your arm or hand becomes pale, cool, tingly, or numb. These symptoms may represent a serious problem that is an emergency. Do not wait to see if the symptoms will go away. Get medical help right away. Call your local emergency services (911 in the [place] Do not drive yourself to the hospital. Summary  After the procedure, it is common to have bruising and tenderness at the site.  Follow instructions from your health care provider about how to take care of your radial site wound. Check the wound every day for signs of infection.  Do not lift anything that is heavier than 10 lb (4.5 kg), or  the limit that you are told, until your health care provider says that it is safe. This information is not intended to replace advice given to you by your health care provider. Make sure you discuss any questions you have with your health care provider. Document Revised: 07/28/2017 Document Reviewed: 07/28/2017 Elsevier Patient Education  2020 [employer].

## 2019-12-05 NOTE — Telephone Encounter (Signed)
[name] the patient's son is calling stating he is returning [place] Kroeger's call. I was unable to find any documentation of this call, but advised [name] I would send a message in regards to it. Please advise.

## 2019-12-05 NOTE — Progress Notes (Signed)
   Pre-cath blood work found Hgb 9.1, previously 12 03/2019. Repeat today confirmed Hgb 8.7. She had a colonoscopy 11/2019 to evaluate positive hemoccult stools which was negative. She did not have an endoscopy at that time. No complaints of hematuria, hematochezia, melena, hematemesis, or vaginal bleeding at this time.   She presented today for LHC to evaluate an abnormal coronary CTA. Thankfully she has not had chest pain or SOB recently.  Discussed with Dr. Debara Pickett and Dr. Claiborne Billings who are in agreement to proceed with a diagnostic heart catheterization.   Patient and son [name]) updated to plan and in agreement to proceed.   Abigail Butts, PA-C 12/05/19; 1:07 PM

## 2019-12-05 NOTE — Telephone Encounter (Signed)
Returned call to patient's son [name].He stated he would like to speak to [place].Stated he spoke to her earlier today.Advised she is not in office this afternoon.I will send message to her.

## 2019-12-05 NOTE — Telephone Encounter (Signed)
   Returned sons call. All questions answered. Will see in follow-up 12/12/19 at 3:15pm.   Abigail Butts, PA-C 12/05/19; 4:58 PM

## 2019-12-08 ENCOUNTER — Telehealth: Payer: Self-pay | Admitting: Physician Assistant

## 2019-12-08 DIAGNOSIS — R195 Other fecal abnormalities: Secondary | ICD-10-CM

## 2019-12-08 NOTE — Telephone Encounter (Signed)
Message sent via MY Chart:  "Hemoglobin was 9.1 last month, and 8.7 this week.  Since recent colonoscopy was clear, requesting and endoscopy to help discover any bleeding issues?  Currently, observing no blood loss,  (coughing, stool, or vaginal).  I went in for a Catherization, and was told that I would have to solve this anemic issue before my next intervention.   Please advise.

## 2019-12-11 ENCOUNTER — Other Ambulatory Visit: Payer: Self-pay

## 2019-12-11 ENCOUNTER — Ambulatory Visit (HOSPITAL_COMMUNITY): Payer: Medicare Other | Attending: Cardiovascular Disease

## 2019-12-11 DIAGNOSIS — E785 Hyperlipidemia, unspecified: Secondary | ICD-10-CM | POA: Insufficient documentation

## 2019-12-11 DIAGNOSIS — I25110 Atherosclerotic heart disease of native coronary artery with unstable angina pectoris: Secondary | ICD-10-CM | POA: Diagnosis not present

## 2019-12-11 DIAGNOSIS — R011 Cardiac murmur, unspecified: Secondary | ICD-10-CM | POA: Insufficient documentation

## 2019-12-11 NOTE — Telephone Encounter (Signed)
PCP (Per[facility]) sent her to [place] for heme positive stool but this is the first I have been made aware that she is anemic.  Looks like CBC checked by cardiology on 11/30/19 and 12/05/19 and cardiac cath done due to shortness of breath.  Multivessel CAD found, no stent or other intervention taken.  Difficult to know if the anemia is entirely due to heme positive stool detected by PCP in Sept 2020, or whether there may be other contributing factors.  Anemia can occur for many reasons.  An upper endoscopy is warranted to rule out an upper GI source of blood loss, but she needs some additional blood testing first and, if iron deficient, a period of iron supplementation to increase hemoglobin before proceeding with EGD.  Since her PCP is outside of [facility] and this electronic chart system, I will order the labs to be done at our office this week.  Please arrange : Iron,TIBC, ferritin, folic acid, vitamin R00 and reticulocyte count  Will contact patient with results and next step.  Thanks  - HD

## 2019-12-11 NOTE — Telephone Encounter (Signed)
Message sent to the pt via My Chart to come in for lab work.  Orders entered

## 2019-12-12 ENCOUNTER — Other Ambulatory Visit (INDEPENDENT_AMBULATORY_CARE_PROVIDER_SITE_OTHER): Payer: Medicare Other

## 2019-12-12 ENCOUNTER — Ambulatory Visit (INDEPENDENT_AMBULATORY_CARE_PROVIDER_SITE_OTHER): Payer: Medicare Other | Admitting: Medical

## 2019-12-12 ENCOUNTER — Encounter: Payer: Self-pay | Admitting: Medical

## 2019-12-12 ENCOUNTER — Ambulatory Visit: Payer: Medicare Other | Admitting: Physician Assistant

## 2019-12-12 VITALS — BP 126/77 | HR 65 | Ht 60.0 in | Wt 139.0 lb

## 2019-12-12 DIAGNOSIS — R195 Other fecal abnormalities: Secondary | ICD-10-CM

## 2019-12-12 DIAGNOSIS — I25110 Atherosclerotic heart disease of native coronary artery with unstable angina pectoris: Secondary | ICD-10-CM | POA: Diagnosis not present

## 2019-12-12 DIAGNOSIS — I10 Essential (primary) hypertension: Secondary | ICD-10-CM | POA: Diagnosis not present

## 2019-12-12 DIAGNOSIS — E785 Hyperlipidemia, unspecified: Secondary | ICD-10-CM | POA: Diagnosis not present

## 2019-12-12 DIAGNOSIS — R011 Cardiac murmur, unspecified: Secondary | ICD-10-CM | POA: Diagnosis not present

## 2019-12-12 DIAGNOSIS — D509 Iron deficiency anemia, unspecified: Secondary | ICD-10-CM

## 2019-12-12 LAB — FOLATE: Folate: >24.8 ng/mL (ref >5.9)

## 2019-12-12 LAB — IBC + FERRITIN
Ferritin: 3.8 ng/mL — ABNORMAL LOW (ref 10.0–291.0)
Iron: 15 ug/dL — ABNORMAL LOW (ref 42–145)
Saturation Ratios: 2.7 % — ABNORMAL LOW (ref 20.0–50.0)
Transferrin: 404 mg/dL — ABNORMAL HIGH (ref 212.0–360.0)

## 2019-12-12 LAB — RETICULOCYTES
ABS Retic: 47450 cells/uL (ref 20000–8000)
Retic Ct Pct: 1.3 %

## 2019-12-12 LAB — VITAMIN B12: Vitamin B-12: 1123 pg/mL — ABNORMAL HIGH (ref 211–911)

## 2019-12-12 NOTE — Patient Instructions (Signed)
Medication Instructions:  Continue current medications  *If you need a refill on your cardiac medications before your next appointment, please call your pharmacy*   Lab Work: None Ordered.   Testing/Procedures: None Ordered   Follow-Up: At [facility], you and your health needs are our priority.  As part of our continuing mission to provide you with exceptional heart care, we have created designated Provider Care Teams.  These Care Teams include your primary Cardiologist (physician) and Advanced Practice Providers (APPs -  Physician Assistants and Nurse Practitioners) who all work together to provide you with the care you need, when you need it.  We recommend signing up for the patient portal called "MyChart".  Sign up information is provided on this After Visit Summary.  MyChart is used to connect with patients for Virtual Visits (Telemedicine).  Patients are able to view lab/test results, encounter notes, upcoming appointments, etc.  Non-urgent messages can be sent to your provider as well.   To learn more about what you can do with MyChart, go to [email or web address].    Your next appointment:   2 month(s)  The format for your next appointment:   In Person  Provider:   You may see Pixie Casino, MD or one of the following Advanced Practice Providers on your designated Care Team:    Almyra Deforest, PA-C  Fabian Sharp, PA-C or   Roby Lofts, [place]

## 2019-12-12 NOTE — Progress Notes (Signed)
Cardiology Office Note   Date:  12/17/2019   ID:  Pamela Blair, Pamela Blair July 28, 1940, MRN [id number]  PCP:  Crist Infante, MD  Cardiologist:  Pixie Casino, MD EP: None  Chief Complaint  Patient presents with  . Follow-up    recent cardiac cath      History of Present Illness: Pamela Blair is a 79 y.o. female with PMH of CAD, HTN, HLD,  who presents for post-catheterization follow-up.   She was last evaluated by cardiology at an outpatient visit with myself 11/30/19 to discuss a recent abnormal coronary CTA. She was recommended for a LHC to further evaluate findings. Unfortunately pre-cath lab work revealed new anemia from last labs 03/2019. Hgb was 9.1. Plan was discussed with Dr. Debara Pickett, as well as Dr. Claiborne Billings who was performing her catheterization and decision made to perform a diagnosed cath only. She underwent a LHC 12/05/19 which revealed 80% pLCx stenosis, 70% 1st diagonal stenosis, and 75% mLAD stenosis, otherwise with diffuse moderate CAD. Also noted to have extensive mitral annular calcifications. She was recommended to start metoprolol succinate 25mg  daily in addition to amlodipine 5mg  daily for medical management while she underwent ongoing evaluation for her anemia. She was subsequently recommended for an anemia work-up with GI with plans to likely start iron infusions and ultimately undergo an EGD to evaluate for source of bleeding given recent benign colonoscopy 11/2019.   She presents today with her son, Pamela Blair, for post-cath follow-up. She has been doing fine since her procedure. They have several questions regarding expectations for management going forward. Thankfully she has not had any recurrent chest pain or DOE. We discussed the goal at this point is preventing progression of disease. We discussed her current medications and the reasons she is taking them. Her blood pressures have been in the 110s-130s/60s-70s. She has noticed some fatigue which we discussed could be related to  her anemia vs adjustment to her BBlocker which was started last week. She has been in touch with her gastroenterologist with plans for anemia blood work today and likely EGD at some point in the future. She has intermittent ankle edema which does not last for any significant amount of time. We discussed limiting salt intake and adhering to a mediterranean diet. No complaints of dizziness, lightheadedness, syncope, orthopnea, PND, palpitations, hematuria, hematochezia, melena, or vaginal bleeding. We discussed the plans for possible coronary artery stenting will follow her anemia/GI work-up.    Past Medical History:  Diagnosis Date  . Blood transfusion without reported diagnosis   . Cervical spinal stenosis   . Cervical spondylosis   . Hyperlipidemia   . Osteoporosis     Past Surgical History:  Procedure Laterality Date  . ABDOMINAL HYSTERECTOMY     and BSO, dx- dysfunctional bleeding  . ANTERIOR CERVICAL DECOMP/DISCECTOMY FUSION  10/3-10/2010   Dr Pamala Hurry , [place] , [place]   . CESAREAN SECTION     x 4  . COLONOSCOPY     Dr Deatra Ina  . 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 LEFT HEART CATH AND CORONARY ANGIOGRAPHY N/A 12/05/2019   Procedure: LEFT HEART CATH AND CORONARY ANGIOGRAPHY;  Surgeon: Troy Sine, MD;  Location: [facility];  Service: Cardiovascular;  Laterality: N/A;  . TOTAL KNEE ARTHROPLASTY       Current Outpatient Medications  Medication Sig Dispense Refill  . acetaminophen (TYLENOL) 500 MG tablet Take 500-1,000 mg by mouth  every 6 (six) hours as needed for mild pain or moderate pain.     Marland Kitchen amLODipine (NORVASC) 5 MG tablet Take 1 tablet (5 mg total) by mouth daily. 30 tablet 6  . aspirin EC 81 MG tablet Take 81 mg by mouth at bedtime.     Marland Kitchen atorvastatin (LIPITOR) 80 MG tablet Take 1 tablet (80 mg total) by mouth daily. 90 tablet 3  . b complex vitamins tablet Take 1 tablet by mouth daily.    . bimatoprost (LUMIGAN) 0.01 %  SOLN Place 1 drop into both eyes at bedtime.    . Calcium Carbonate Antacid (CALCIUM CARBONATE PO) Take 1,000 mg by mouth daily.     Marland Kitchen CALCIUM CITRATE PO Take 1,000 mg by mouth daily.    . Cholecalciferol (VITAMIN D3) 2000 UNITS capsule Take 1 capsule (2,000 Units total) by mouth daily. 100 capsule 3  . Coenzyme Q10 (COQ10 PO) Take 20 mg by mouth daily at 12 noon.   0  . diclofenac Sodium (VOLTAREN) 1 % GEL Apply 2 g topically at bedtime as needed (Hip Pain).    Marland Kitchen ezetimibe (ZETIA) 10 MG tablet Take 10 mg by mouth at bedtime.     . Glycerin-Hypromellose-PEG 400 (DRY EYE RELIEF DROPS) 0.2-0.2-1 % SOLN Place 1 drop into both eyes daily as needed (Dry eye).    Marland Kitchen MAGNESIUM PO Take 1 tablet by mouth daily.     . metoprolol succinate (TOPROL XL) 25 MG 24 hr tablet Take 1 tablet (25 mg total) by mouth daily. 30 tablet 3  . nitroGLYCERIN (NITROSTAT) 0.4 MG SL tablet Place 1 tablet (0.4 mg total) under the tongue every 5 (five) minutes as needed for chest pain. 25 tablet 3  . omega-3 fish oil (MAXEPA) 1000 MG CAPS capsule Take 3,000 mg by mouth daily.     . TURMERIC PO Take 1 capsule by mouth daily at 12 noon.     No current facility-administered medications for this visit.    Allergies:   Niacin, Clarithromycin, Neomycin-bacitracin-polymyxin  [bacitracin-neomycin-polymyxin], and Neomycin    Social History:  The patient  reports that she has never smoked. She has never used smokeless tobacco. She reports that she does not drink alcohol and does not use drugs.   Family History:  The patient's family history includes Heart attack (age of onset: 63) in her father; Stroke (age of onset: 75) in her mother.    ROS:  Please see the history of present illness.   Otherwise, review of systems are positive for none.   All other systems are reviewed and negative.    PHYSICAL EXAM: VS:  BP 126/77   Pulse 65   Ht 5' (1.524 m)   Wt 139 lb (63 kg)   SpO2 99%   BMI 27.15 kg/m  , BMI Body mass index is 27.15  kg/m. GEN: Well nourished, well developed, in no acute distress HEENT: sclera anicteric Neck: no JVD, carotid bruits, or masses Cardiac: RRR; +murmur, no rubs or gallops,no edema  Respiratory:  clear to auscultation bilaterally, normal work of breathing GI: soft, nontender, nondistended, + BS MS: no deformity or atrophy Skin: warm and dry, no rash Neuro:  Strength and sensation are intact Psych: euthymic mood, full affect   EKG:  EKG is not ordered today.   Recent Labs: 11/30/2019: BUN 14; Creatinine, Ser 0.71; Potassium 4.8; Sodium 138 12/05/2019: Hemoglobin 8.7; Platelets 333    Lipid Panel    Component Value Date/Time   CHOL 204 (H)  10/17/2013 0934   TRIG 60.0 10/17/2013 0934   HDL 58.10 10/17/2013 0934   CHOLHDL 4 10/17/2013 0934   VLDL 12.0 10/17/2013 0934   LDLCALC 134 (H) 10/17/2013 0934   LDLDIRECT 158.8 11/11/2012 0935      Wt Readings from Last 3 Encounters:  12/12/19 139 lb (63 kg)  12/05/19 136 lb (61.7 kg)  11/30/19 138 lb 9.6 oz (62.9 kg)      Other studies Reviewed: Additional studies/ records that were reviewed today include:   Coronary CTA 11/27/19: IMPRESSION: 1. At least moderate mixed proximal LAD stenosis, CADRADS = 3. CT FFR will be performed and reported separately.  2. Coronary calcium score of 901. This was 92nd percentile for age and sex matched control.  3. Normal coronary origin with right dominance.  4. Aortic and mitral annular calcification.  5. Extensive aortic atherosclerosis.  FFR IMPRESSION: 1. CT FFR demonstrates significant flow limitation in the proximal to mid-LAD and possibly the mid-LCx. Borderline flow limitation in the D1 vessel.  2. Cardiac catheterization is recommended based on findings and symptom pattern.  Left heart catheterization 12/05/19:  Ost Cx to Prox Cx lesion is 80% stenosed.  2nd Mrg lesion is 60% stenosed.  1st Diag lesion is 70% stenosed.  Prox LAD to Mid LAD lesion is 60%  stenosed.  Mid LAD lesion is 75% stenosed.  Dist LAD lesion is 60% stenosed.  Ost RCA lesion is 20% stenosed.  Prox RCA lesion is 20% stenosed.  RPDA lesion is 50% stenosed.  RPAV-1 lesion is 40% stenosed.  RPAV-2 lesion is 40% stenosed.  The left ventricular systolic function is normal.  LV end diastolic pressure is normal.   Multivessel coronary artery disease with coronary calcification.  The LAD gives rise to a proximal diagonal vessel that has 75% ostial stenosis.  The LAD has 60% stenosis between the first diagonal and septal perforating artery followed by diffuse area of 70 to 80% stenosis in the mid vessel followed by 60% mid stenosis.  The left circumflex vessel has a very sharp 90 degree angle proximally and in the segment there is an 80% focal stenosis.  There is diffuse 60% stenosis in a large OM1 vessel.  He has mild 20% narrowing followed by 20% mid stenosis.  There is 50% ostial PDA stenosis with 40% stenosis in the distal RCA followed by an additional 40% more distal stenosis.  Preserved global LV contractility with EF estimation at least 55%.  LVEDP 14 mmHg.  There is extensive mitral annular calcification.  RECOMMENDATION: I had a long discussion with the patient and reviewed the catheterization findings.  Since she is not on any anti-ischemic medications she will be discharged today with initiation of metoprolol succinate as well as amlodipine.  She will need follow-up hemoglobin to reassess her progressive anemia with probable need for endoscopy. Titrate medications as blood pressure and heart rate allow.  May need future intervention.   Echocardiogram 12/11/19: 1. Left ventricular ejection fraction, by estimation, is 60 to 65%. The  left ventricle has normal function. The left ventricle has no regional  wall motion abnormalities. Left ventricular diastolic parameters are  consistent with Grade II diastolic  dysfunction (pseudonormalization).  2. Right  ventricular systolic function is normal. The right ventricular  size is normal. There is normal pulmonary artery systolic pressure.  3. Left atrial size was moderately dilated.  4. The mitral valve is normal in structure. Trivial mitral valve  regurgitation. No evidence of mitral stenosis.  5. The aortic  valve is grossly normal. Aortic valve regurgitation is  mild. No aortic stenosis is present.     ASSESSMENT AND PLAN:  1. CAD: patient was found to have 80% pLCx stenosis, 70% 1st diagonal stenosis, and 75% mLAD stenosis, otherwise with diffuse moderate CAD. Thankfully no anginal complaints - Continue aspirin and statin - Continue metoprolol succinate and amlodipine - Could consider intervention should she develop symptoms and anemia has been corrected.   2. HTN: BP 126/77 today - Continue metoprolol succinate and amlodipine  3. HLD: LDL 150 03/2019; goal LDL <70. Previously intolerant to pravastatin, though seems to be tolerating atorvastatin 80mg  daily for now - Continue atorvastatin 80mg  daily for aggressive risk factor modifications. If she fails, will refer to the lipid clinic for PSK9i consideration - Continue zetia and omega 3 - Will need a repeat FLP/LFTs in 6 weeks   4. Murmur: No significant valvular abnormalities on recent echo.  - Continue to monitor  5. Anemia: Hgb 9.1>8.7 on the day of her heart cath. Going for blood work with her GI provider after this visit. Anticipating starting iron infusions with likely EGD in the future.  - Continue close follow-up with GI       Current medicines are reviewed at length with the patient today.  The patient does not have concerns regarding medicines.  The following changes have been made:  As above  Labs/ tests ordered today include:  No orders of the defined types were placed in this encounter.    Disposition:   FU with Dr. Debara Pickett in 2 months  Signed, Abigail Butts, PA-C  12/17/2019 10:29 PM

## 2019-12-13 ENCOUNTER — Ambulatory Visit (HOSPITAL_COMMUNITY)
Admission: RE | Admit: 2019-12-13 | Discharge: 2019-12-13 | Disposition: A | Discharge status: Home / Self Care | Payer: Medicare Other | Source: Ambulatory Visit | Attending: Internal Medicine | Admitting: Internal Medicine

## 2019-12-13 ENCOUNTER — Other Ambulatory Visit: Payer: Self-pay

## 2019-12-13 DIAGNOSIS — M810 Age-related osteoporosis without current pathological fracture: Secondary | ICD-10-CM | POA: Diagnosis not present

## 2019-12-13 DIAGNOSIS — D509 Iron deficiency anemia, unspecified: Secondary | ICD-10-CM

## 2019-12-13 MED ORDER — DENOSUMAB 60 MG/ML ~~LOC~~ SOSY
PREFILLED_SYRINGE | SUBCUTANEOUS | Status: AC
  Filled 2019-12-13: qty 1

## 2019-12-13 MED ORDER — DENOSUMAB 60 MG/ML ~~LOC~~ SOSY
60.0000 mg | PREFILLED_SYRINGE | Freq: Once | SUBCUTANEOUS | Status: AC
  Administered 2019-12-13: 60 mg via SUBCUTANEOUS

## 2019-12-17 ENCOUNTER — Encounter: Payer: Self-pay | Admitting: Medical

## 2019-12-18 ENCOUNTER — Telehealth: Payer: Self-pay | Admitting: Internal Medicine

## 2019-12-18 NOTE — Telephone Encounter (Signed)
Spoke with Dayna and advised that most recent CBC on 6/1 will be e-faxed to their office.

## 2019-12-18 NOTE — Telephone Encounter (Signed)
Dayna from [facility] is calling requesting the patient's most recent CBC labs be faxed over to their office. Their fax number is [phone]. Please advise.

## 2019-12-19 NOTE — Telephone Encounter (Signed)
Duplicate message. 

## 2019-12-20 ENCOUNTER — Other Ambulatory Visit: Payer: Self-pay

## 2019-12-20 ENCOUNTER — Ambulatory Visit (AMBULATORY_SURGERY_CENTER): Payer: Self-pay | Admitting: *Deleted

## 2019-12-20 ENCOUNTER — Telehealth: Payer: Self-pay | Admitting: Gastroenterology

## 2019-12-20 VITALS — Ht 60.0 in | Wt 138.0 lb

## 2019-12-20 DIAGNOSIS — D509 Iron deficiency anemia, unspecified: Secondary | ICD-10-CM

## 2019-12-20 DIAGNOSIS — I2510 Atherosclerotic heart disease of native coronary artery without angina pectoris: Secondary | ICD-10-CM

## 2019-12-20 NOTE — Telephone Encounter (Signed)
Dr. Loletha Carrow the pt care center at [facility] state they have no appts this week. Short stay at [facility] states the earliest they can take her would be Tues for type and cross with blood on Wednesday. Do you want her set up for an observation bed at the hospital? Please advise.

## 2019-12-20 NOTE — Telephone Encounter (Signed)
EGD with possible APC has been scheduled for 01-23-2020 @ 730 am. I have informed Pamela Blair of the new instructions and have emailed them to his e-mail provided ( [email or web address]). I also mailed a paper copy to the address on file.

## 2019-12-20 NOTE — Progress Notes (Signed)
Patient and son is here in-person for PV. Patient denies any allergies to eggs or soy. Patient denies any problems with anesthesia/sedation. Patient denies any oxygen use at home. Patient denies taking any diet/weight loss medications or blood thinners. Patient is not being treated for MRSA or C-diff. Patient is aware of our care-partner policy and [id number] safety protocol. Completed covid vaccines per pt on 10/13/19.

## 2019-12-20 NOTE — Telephone Encounter (Signed)
Pamela Blair,   Regarding the following patient portal message today:  "good afternoon,  I am contacting you today because I am concerned about my dropping BP numbers.  here is a list of my recent observations. typically these reading are taken 1st thing in the morning.  the afternoon reading is around 7-8pm after dinner.   June 9th. am   102/47.    63                     pm. 125/54.    63  June 10th.  am. 107/43.     69                         pm.  99/54.      70  June 11th.  am.  103/57.     68                          pm. 97/70.       65  June 12th. am. 85/54.      74                        pm. 123/47.   67  June 13th.    am. 124/51.    66                           pm.   no reading   June 14 th am.         133/30.     66                   pm.         no reading   June 15th. am.        105/69.  72                         pm.       120 /46. 77     I'm waiting for my endoscopy, Dr Loletha Carrow would like to wait for my iron infusion to be completed 1st, but my iron levels are dangerously low, and we are waiting for the insurance to approve (ridiculous).  if we can get the infusion and be put on a payment plan, vs the health being compromised we will be open to this option.  The health and wellness, is paramount to payment!   The cardiologist, Dr hilti, would like to have the low hgb resolved prior to moving forward with possible stent(s) of vessels with blockages.   I need a more comprehensive explanation of what to do (not to do).   I need a definitive answer on taking an iron supplement, before my fe infusion   Thank you in advance for your time."     My MA [name] came to me with patient/son concerns when they were at the office today to schedule the upper endoscopy.  I have just learned from them that the patient's IV iron has not yet been scheduled because of some insurance difficulties.  I spoke with this patient's son [name] at length just now regarding his  mother's situation and the current plan.  I also have a call out to the cardiologist, Dr. Debara Pickett, discussed the cardiac cath findings and plan for any potential coronary interventions.  This is the  advice I gave him:  1-start once daily slow release iron OTC.  If not causing digestive upset, increase to twice daily.  Take a daily stool softener with it.  2-given this patient's symptomatic anemia and multivessel coronary disease, I do not feel we can wait an unknown period of time for insurance authorization for IV iron and eventual improvement in the hemoglobin prior to the need for upper endoscopy and possible coronary intervention to follow.  Please arrange transfusion of 2 units of PRBCs at the [facility] infusion center this week. Cancel the order for IV iron with [facility] infusion.  3 -  [name], as we discussed, please cancel the upper endoscopy in the [facility] on June 25, and instead put the patient on for an upper endoscopy with me 7:30 AM Friday, July 2 at [facility] outpatient endoscopy.  I discussed with Melodie's son my rationale for doing it there, since she has symptomatic anemia and multivessel coronary disease.  Sedation risks are higher in that case, and it would be more safely done in a closely monitored anesthesia setting.  Also, if any endoscopic intervention is necessary, it can be performed at that time. Please move first several AM [facility] cases back 30 minutes to start at 830.  4 -I asked Maher to contact Dr. Lysbeth Penner office today regarding the recent low blood pressure readings, as adjustments may need to be made in her new antihypertensive medicines. 5 -I will speak with cardiology soon.  - HD

## 2019-12-20 NOTE — Telephone Encounter (Signed)
Then it will have to be next week.    Her hemoglobin is not critically low,she is not overtly GI bleeding (is heme pos with IDA) and it can wait until next week. Does not need hospital admission.  - HD

## 2019-12-21 ENCOUNTER — Other Ambulatory Visit: Payer: Self-pay

## 2019-12-21 DIAGNOSIS — D509 Iron deficiency anemia, unspecified: Secondary | ICD-10-CM

## 2019-12-21 NOTE — Telephone Encounter (Signed)
Noted  

## 2019-12-21 NOTE — Telephone Encounter (Signed)
Med changes from PCP are fine with me.  I am aware Dr. Debara Pickett is out of town - I will speak with him when he returns next week.  My opinion is that the transfusion can wait until the 23rd.  Thanks for the update.  - HD

## 2019-12-21 NOTE — Telephone Encounter (Signed)
Pt scheduled for type and cross [facility] Short stay 6/22@12 :30pm, blood scheduled at [facility] short stay 6/23@8am . Pts son aware of appts.   Pts son states her cardiologist is out of town. Son states PCP Perini started her on protonix 40mg  daily, Ferrous sulfate 325mg  bid and stopped her amlodipine. PCP was hesitant to have her stop the ASA. Son states Dr. Joylene Draft told them if no appt for blood this week they could go through the ER. Let pts son know that Dr. Loletha Carrow feels she is ok to have the blood next week but if she develops any dizziness, chest pain, SOB they can always go to the ER. Dr. Loletha Carrow aware.

## 2019-12-22 ENCOUNTER — Encounter: Payer: Medicare Other | Admitting: Gastroenterology

## 2019-12-26 ENCOUNTER — Ambulatory Visit (HOSPITAL_COMMUNITY)
Admission: RE | Admit: 2019-12-26 | Discharge: 2019-12-26 | Disposition: A | Discharge status: Home / Self Care | Payer: Medicare Other | Source: Ambulatory Visit | Attending: Gastroenterology | Admitting: Gastroenterology

## 2019-12-26 ENCOUNTER — Other Ambulatory Visit: Payer: Self-pay

## 2019-12-26 DIAGNOSIS — M810 Age-related osteoporosis without current pathological fracture: Secondary | ICD-10-CM | POA: Diagnosis present

## 2019-12-26 LAB — PREPARE RBC (CROSSMATCH)

## 2019-12-27 ENCOUNTER — Telehealth: Payer: Self-pay

## 2019-12-27 ENCOUNTER — Encounter (HOSPITAL_COMMUNITY)
Admission: RE | Admit: 2019-12-27 | Discharge: 2019-12-27 | Disposition: A | Discharge status: Home / Self Care | Payer: Medicare Other | Source: Ambulatory Visit | Attending: Gastroenterology | Admitting: Gastroenterology

## 2019-12-27 ENCOUNTER — Other Ambulatory Visit: Payer: Self-pay

## 2019-12-27 DIAGNOSIS — D509 Iron deficiency anemia, unspecified: Secondary | ICD-10-CM | POA: Diagnosis not present

## 2019-12-27 MED ORDER — SODIUM CHLORIDE 0.9% IV SOLUTION: Freq: Once | INTRAVENOUS | Status: DC

## 2019-12-27 NOTE — Progress Notes (Signed)
Spoke with Vaughan Basta at [facility] about patient stating she was little short of breath.  O2 Sat 100, slight coarse breath sounds at bil bases.  Vaughan Basta stated she would talk with physician and call back.

## 2019-12-27 NOTE — Telephone Encounter (Signed)
Pt just finished her second unit of blood at [facility]. 1st unit ran over 3 hours, second unit ran over 2 1/2 hours. Short stay RN calling stating pt is a little more SOB than when she arrived. Stated lungs sound good but wondered if Dr. Loletha Carrow would want pt to receive any lasix. Please advise.

## 2019-12-27 NOTE — Telephone Encounter (Signed)
Spoke with Short Stay RN Sonia Baller and she is aware.

## 2019-12-27 NOTE — Telephone Encounter (Signed)
Thank you for checking.  This patient has normal LV function, so is unlikely to be in pulmonary edema with that rate of infusion.  No lasix.  Thank you.  - HD

## 2019-12-27 NOTE — Progress Notes (Signed)
Spoke with Vaughan Basta with [facility] and Per Dr. Loletha Carrow patient has normal LV function and with infusion rate was unlikely pulmonary overload and we were okay to let patient go home.  Patient also now stated that she does feel "better".

## 2019-12-28 ENCOUNTER — Telehealth: Payer: Self-pay

## 2019-12-28 LAB — BPAM RBC
Blood Product Expiration Date: 202107222359
Blood Product Expiration Date: 202107232359
ISSUE DATE / TIME: 202106230824
ISSUE DATE / TIME: 202106231143
Unit Type and Rh: 6200
Unit Type and Rh: 6200

## 2019-12-28 LAB — TYPE AND SCREEN
ABO/RH(D): A POS
Antibody Screen: POSITIVE
Donor AG Type: NEGATIVE
Donor AG Type: NEGATIVE
PT AG Type: NEGATIVE
Unit division: 0
Unit division: 0

## 2019-12-28 NOTE — Telephone Encounter (Signed)
Please advise 

## 2019-12-28 NOTE — Telephone Encounter (Signed)
Thanks for checking - I have already addressed that in 2 portal messages that the patient and/or son sent today.  - HD

## 2019-12-28 NOTE — Telephone Encounter (Signed)
Patients son was asking about if Elliona still needs iron infusions now after she has had a blood transfusion? Please advise

## 2019-12-29 ENCOUNTER — Encounter: Payer: Medicare Other | Admitting: Gastroenterology

## 2020-01-02 ENCOUNTER — Other Ambulatory Visit (HOSPITAL_COMMUNITY)
Admission: RE | Admit: 2020-01-02 | Discharge: 2020-01-02 | Disposition: A | Discharge status: Home / Self Care | Payer: Medicare Other | Source: Ambulatory Visit | Attending: Gastroenterology | Admitting: Gastroenterology

## 2020-01-02 DIAGNOSIS — Z01812 Encounter for preprocedural laboratory examination: Secondary | ICD-10-CM | POA: Diagnosis present

## 2020-01-02 DIAGNOSIS — Z20822 Contact with and (suspected) exposure to covid-19: Secondary | ICD-10-CM | POA: Insufficient documentation

## 2020-01-02 LAB — SARS CORONAVIRUS 2 (TAT 6-24 HRS): SARS Coronavirus 2: NEGATIVE

## 2020-01-04 ENCOUNTER — Other Ambulatory Visit: Payer: Self-pay

## 2020-01-04 NOTE — Anesthesia Preprocedure Evaluation (Addendum)
Anesthesia Evaluation  Patient identified by MRN, date of birth, ID band Patient awake    Reviewed: Allergy & Precautions, NPO status , Patient's Chart, lab work & pertinent test results  Airway Mallampati: I       Dental no notable dental hx. (+) Teeth Intact   Pulmonary    Pulmonary exam normal breath sounds clear to auscultation       Cardiovascular + CAD  Normal cardiovascular exam Rhythm:Regular Rate:Normal     Neuro/Psych    GI/Hepatic negative GI ROS, Neg liver ROS,   Endo/Other  negative endocrine ROS  Renal/GU negative Renal ROS     Musculoskeletal   Abdominal Normal abdominal exam  (+)   Peds  Hematology negative hematology ROS (+)   Anesthesia Other Findings Left ventricular ejection fraction, by estimation, is 60 to 65%. The left ventricle has normal function. The left ventricle has no regional wall motion abnormalities. Left ventricular diastolic parameters are consistent with Grade II diastolic dysfunction (pseudonormalization). 2. Right ventricular systolic function is normal. The right ventricular size is normal. There is normal pulmonary artery systolic pressure. 3. Left atrial size was moderately dilated. 4. The mitral valve is normal in structure. Trivial mitral valve regurgitation. No evidence of mitral stenosis. 5. The aortic valve is grossly normal. Aortic valve regurgitation is mild. No aortic stenosis is Present  Ost Cx to Prox Cx lesion is 80% stenosed.  2nd Mrg lesion is 60% stenosed.  1st Diag lesion is 70% stenosed.  Prox LAD to Mid LAD lesion is 60% stenosed.  Mid LAD lesion is 75% stenosed.  Dist LAD lesion is 60% stenosed.  Ost RCA lesion is 20% stenosed.  Prox RCA lesion is 20% stenosed.  RPDA lesion is 50% stenosed.  RPAV-1 lesion is 40% stenosed.  RPAV-2 lesion is 40% stenosed.  The left ventricular systolic function is normal.  LV end diastolic pressure is  normal.   Multivessel coronary artery disease with coronary calcification.  The LAD gives rise to a proximal diagonal vessel that has 75% ostial stenosis.  The LAD has 60% stenosis between the first diagonal and septal perforating artery followed by diffuse area of 70 to 80% stenosis in the mid vessel followed by 60% mid stenosis.  The left circumflex vessel has a very sharp 90 degree angle proximally and in the segment there is an 80% focal stenosis.  There is diffuse 60% stenosis in a large OM1 vessel.  He has mild 20% narrowing followed by 20% mid stenosis.  There is 50% ostial PDA stenosis with 40% stenosis in the distal RCA followed by an additional 40% more distal stenosis.  Preserved global LV contractility with EF estimation at least 55%.  LVEDP 14 mmHg.  There is extensive mitral annular calcification.  RECOMMENDATION: I had a long discussion with the patient and reviewed the catheterization findings.  Since she is not on any anti-ischemic medications she will be discharged today with initiation of metoprolol succinate as well as amlodipine.  She will need follow-up hemoglobin to reassess her progressive anemia with probable need for endoscopy. Titrate medications as blood pressure and heart rate allow.  May need future intervention.    Reproductive/Obstetrics                           Anesthesia Physical Anesthesia Plan  ASA: III  Anesthesia Plan: MAC   Post-op Pain Management:    Induction:   PONV Risk Score and Plan: Propofol infusion and  TIVA  Airway Management Planned: Natural Airway and Mask  Additional Equipment: None  Intra-op Plan:   Post-operative Plan:   Informed Consent: I have reviewed the patients History and Physical, chart, labs and discussed the procedure including the risks, benefits and alternatives for the proposed anesthesia with the patient or authorized representative who has indicated his/her understanding and  acceptance.       Plan Discussed with: CRNA  Anesthesia Plan Comments:        Anesthesia Quick Evaluation

## 2020-01-05 ENCOUNTER — Other Ambulatory Visit: Payer: Self-pay

## 2020-01-05 ENCOUNTER — Ambulatory Visit (HOSPITAL_COMMUNITY): Payer: Medicare Other | Admitting: Certified Registered Nurse Anesthetist

## 2020-01-05 ENCOUNTER — Ambulatory Visit (HOSPITAL_COMMUNITY)
Admission: RE | Admit: 2020-01-05 | Discharge: 2020-01-05 | Disposition: A | Discharge status: Home / Self Care | Payer: Medicare Other | Attending: Gastroenterology | Admitting: Gastroenterology

## 2020-01-05 ENCOUNTER — Encounter (HOSPITAL_COMMUNITY)
Admission: RE | Disposition: A | Discharge status: Home / Self Care | Payer: Self-pay | Source: Home / Self Care | Attending: Gastroenterology

## 2020-01-05 ENCOUNTER — Encounter (HOSPITAL_COMMUNITY): Payer: Self-pay | Admitting: Gastroenterology

## 2020-01-05 DIAGNOSIS — D500 Iron deficiency anemia secondary to blood loss (chronic): Secondary | ICD-10-CM | POA: Diagnosis not present

## 2020-01-05 DIAGNOSIS — K922 Gastrointestinal hemorrhage, unspecified: Secondary | ICD-10-CM | POA: Diagnosis not present

## 2020-01-05 DIAGNOSIS — Z96659 Presence of unspecified artificial knee joint: Secondary | ICD-10-CM | POA: Insufficient documentation

## 2020-01-05 DIAGNOSIS — R195 Other fecal abnormalities: Secondary | ICD-10-CM

## 2020-01-05 DIAGNOSIS — I2510 Atherosclerotic heart disease of native coronary artery without angina pectoris: Secondary | ICD-10-CM | POA: Insufficient documentation

## 2020-01-05 DIAGNOSIS — D509 Iron deficiency anemia, unspecified: Secondary | ICD-10-CM

## 2020-01-05 HISTORY — PX: HOT HEMOSTASIS: SHX5433

## 2020-01-05 HISTORY — PX: ENTEROSCOPY: SHX5533

## 2020-01-05 LAB — CBC WITH DIFFERENTIAL/PLATELET
Abs Immature Granulocytes: 0.01 10*3/uL (ref 0.00–0.07)
Basophils Absolute: 0.1 10*3/uL (ref 0.0–0.1)
Basophils Relative: 1 %
Eosinophils Absolute: 0.3 10*3/uL (ref 0.0–0.5)
Eosinophils Relative: 6 %
HCT: 40.8 % (ref 36.0–46.0)
Hemoglobin: 12.5 g/dL (ref 12.0–15.0)
Immature Granulocytes: 0 %
Lymphocytes Relative: 47 %
Lymphs Abs: 2.3 10*3/uL (ref 0.7–4.0)
MCH: 26 pg (ref 26.0–34.0)
MCHC: 30.6 g/dL (ref 30.0–36.0)
MCV: 84.8 fL (ref 80.0–100.0)
Monocytes Absolute: 0.4 10*3/uL (ref 0.1–1.0)
Monocytes Relative: 9 %
Neutro Abs: 1.8 10*3/uL (ref 1.7–7.7)
Neutrophils Relative %: 37 %
Platelets: 256 10*3/uL (ref 150–400)
RBC: 4.81 MIL/uL (ref 3.87–5.11)
RDW: 21.8 % — ABNORMAL HIGH (ref 11.5–15.5)
WBC: 4.9 10*3/uL (ref 4.0–10.5)
nRBC: 0 % (ref 0.0–0.2)

## 2020-01-05 LAB — IRON AND TIBC
Iron: 84 ug/dL (ref 28–170)
Saturation Ratios: 17 % (ref 10.4–31.8)
TIBC: 503 ug/dL — ABNORMAL HIGH (ref 250–450)
UIBC: 419 ug/dL

## 2020-01-05 LAB — FERRITIN: Ferritin: 19 ng/mL (ref 11–307)

## 2020-01-05 SURGERY — EGD, WITH ARGON PLASMA COAGULATION
Anesthesia: Monitor Anesthesia Care

## 2020-01-05 MED ORDER — PROPOFOL 1000 MG/100ML IV EMUL
INTRAVENOUS | Status: AC
  Filled 2020-01-05: qty 200

## 2020-01-05 MED ORDER — LACTATED RINGERS IV SOLN
INTRAVENOUS | Status: DC
  Administered 2020-01-05: 1000 mL via INTRAVENOUS

## 2020-01-05 MED ORDER — SODIUM CHLORIDE 0.9 % IV SOLN: INTRAVENOUS | Status: DC

## 2020-01-05 MED ORDER — PROPOFOL 10 MG/ML IV BOLUS
INTRAVENOUS | Status: DC | PRN
  Administered 2020-01-05: 60 mg via INTRAVENOUS
  Administered 2020-01-05: 75 ug/kg/min via INTRAVENOUS

## 2020-01-05 MED ORDER — PROPOFOL 1000 MG/100ML IV EMUL
INTRAVENOUS | Status: AC
  Filled 2020-01-05: qty 100

## 2020-01-05 MED ORDER — LIDOCAINE 2% (20 MG/ML) 5 ML SYRINGE
INTRAMUSCULAR | Status: DC | PRN
  Administered 2020-01-05: 40 mg via INTRAVENOUS

## 2020-01-05 MED ORDER — PROPOFOL 500 MG/50ML IV EMUL
INTRAVENOUS | Status: AC
  Filled 2020-01-05: qty 50

## 2020-01-05 SURGICAL SUPPLY — 14 items

## 2020-01-05 NOTE — H&P (Signed)
History:  This patient presents for endoscopic testing for Iron deficiency anemia and heme positive stool.  Iva Boop Referring physician: Crist Infante, MD  Past Medical History: Past Medical History:  Diagnosis Date  . Blood transfusion without reported diagnosis   . Cervical spinal stenosis   . Cervical spondylosis   . Hyperlipidemia   . Osteoporosis      Past Surgical History: Past Surgical History:  Procedure Laterality Date  . ABDOMINAL HYSTERECTOMY     and BSO, dx- dysfunctional bleeding  . ANTERIOR CERVICAL DECOMP/DISCECTOMY FUSION  10/3-10/2010   Dr Pamala Hurry , [place] , [place]   . CESAREAN SECTION     x 4  . COLONOSCOPY     Dr Deatra Ina  . 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 LEFT HEART CATH AND CORONARY ANGIOGRAPHY N/A 12/05/2019   Procedure: LEFT HEART CATH AND CORONARY ANGIOGRAPHY;  Surgeon: Troy Sine, MD;  Location: [facility];  Service: Cardiovascular;  Laterality: N/A;  . TOTAL KNEE ARTHROPLASTY      Allergies: Allergies  Allergen Reactions  . Niacin     rash  . Clarithromycin Other (See Comments)    unknown reaction  . Neomycin-Bacitracin-Polymyxin  [Bacitracin-Neomycin-Polymyxin]   . Neomycin Rash    Outpatient Meds: Current Facility-Administered Medications  Medication Dose Route Frequency Provider Last Rate Last Admin  . 0.9 %  sodium chloride infusion   Intravenous Continuous Danis, Estill Cotta III, MD      . lactated ringers infusion   Intravenous Continuous Nelida Meuse III, MD 10 mL/hr at 01/05/20 0716 1,000 mL at 01/05/20 0716      ___________________________________________________________________ Objective   Exam:  BP 131/76   Pulse 69   Temp 98 F (36.7 C) (Oral)   Resp 16   Ht 5' (1.524 m)   Wt 62.6 kg   SpO2 97%   BMI 26.95 kg/m    CV: RRR without murmur, S1/S2, no JVD, no peripheral edema  Resp: clear to auscultation bilaterally, normal RR and effort noted  GI:  soft, no tenderness, with active bowel sounds. No guarding or palpable organomegaly noted.  Neuro: awake, alert and oriented x 3. Normal gross motor function and fluent speech   Assessment:  IDA, heme positive Had PRBC transfusion last week  Plan:  Small bowel Enteroscopy (discussed rationale for this study vs EGD with patient in pre-op)   Nelida Meuse III

## 2020-01-05 NOTE — Discharge Instructions (Signed)
YOU HAD AN ENDOSCOPIC PROCEDURE TODAY: Refer to the procedure report and other information in the discharge instructions given to you for any specific questions about what was found during the examination. If this information does not answer your questions, please call [place] office at [phone] to clarify.  ° °YOU SHOULD EXPECT: Some feelings of bloating in the abdomen. Passage of more gas than usual. Walking can help get rid of the air that was put into your GI tract during the procedure and reduce the bloating. If you had a lower endoscopy (such as a colonoscopy or flexible sigmoidoscopy) you may notice spotting of blood in your stool or on the toilet paper. Some abdominal soreness may be present for a day or two, also. ° °DIET: Your first meal following the procedure should be a light meal and then it is ok to progress to your normal diet. A half-sandwich or bowl of soup is an example of a good first meal. Heavy or fried foods are harder to digest and may make you feel nauseous or bloated. Drink plenty of fluids but you should avoid alcoholic beverages for 24 hours. If you had a esophageal dilation, please see attached instructions for diet.   ° °ACTIVITY: Your care partner should take you home directly after the procedure. You should plan to take it easy, moving slowly for the rest of the day. You can resume normal activity the day after the procedure however YOU SHOULD NOT DRIVE, use power tools, machinery or perform tasks that involve climbing or major physical exertion for 24 hours (because of the sedation medicines used during the test).  ° °SYMPTOMS TO REPORT IMMEDIATELY: °A gastroenterologist can be reached at any hour. Please call [phone]  for any of the following symptoms:  °Following lower endoscopy (colonoscopy, flexible sigmoidoscopy) °Excessive amounts of blood in the stool  °Significant tenderness, worsening of abdominal pains  °Swelling of the abdomen that is new, acute  °Fever of 100° or  higher  °Following upper endoscopy (EGD, EUS, ERCP, esophageal dilation) °Vomiting of blood or coffee ground material  °New, significant abdominal pain  °New, significant chest pain or pain under the shoulder blades  °Painful or persistently difficult swallowing  °New shortness of breath  °Black, tarry-looking or red, bloody stools ° °FOLLOW UP:  °If any biopsies were taken you will be contacted by phone or by letter within the next 1-3 weeks. Call [phone]  if you have not heard about the biopsies in 3 weeks.  °Please also call with any specific questions about appointments or follow up tests. ° °

## 2020-01-05 NOTE — Interval H&P Note (Signed)
History and Physical Interval Note:  01/05/2020 7:30 AM  Pamela Blair  has presented today for surgery, with the diagnosis of symptomatic anemia, IDA, Multivessel coronary artery disease.  The various methods of treatment have been discussed with the patient and family. After consideration of risks, benefits and other options for treatment, the patient has consented to  Procedure(s): HOT HEMOSTASIS (ARGON PLASMA COAGULATION/BICAP) (N/A) ENTEROSCOPY (N/A) as a surgical intervention.  The patient's history has been reviewed, patient examined, no change in status, stable for surgery.  I have reviewed the patient's chart and labs.  Questions were answered to the patient's satisfaction.     Nelida Meuse III

## 2020-01-05 NOTE — Anesthesia Postprocedure Evaluation (Signed)
Anesthesia Post Note  Patient: Pamela Blair  Procedure(s) Performed: HOT HEMOSTASIS (ARGON PLASMA COAGULATION/BICAP) (N/A ) ENTEROSCOPY (N/A )     Patient location during evaluation: Endoscopy Anesthesia Type: MAC Level of consciousness: sedated Pain management: pain level controlled Vital Signs Assessment: post-procedure vital signs reviewed and stable Respiratory status: spontaneous breathing Cardiovascular status: stable Postop Assessment: no apparent nausea or vomiting Anesthetic complications: no   No complications documented.  Last Vitals:  Vitals:   01/05/20 0707 01/05/20 0753  BP: 131/76 (!) 113/59  Pulse: 69 61  Resp: 16 (!) 24  Temp: 36.7 C 36.6 C  SpO2: 97% 100%    Last Pain:  Vitals:   01/05/20 0753  TempSrc: Oral  PainSc: 0-No pain                 Huston Foley

## 2020-01-05 NOTE — Transfer of Care (Signed)
Immediate Anesthesia Transfer of Care Note  Patient: Pamela Blair  Procedure(s) Performed: Procedure(s): HOT HEMOSTASIS (ARGON PLASMA COAGULATION/BICAP) (N/A) ENTEROSCOPY (N/A)  Patient Location: PACU and Endoscopy Unit  Anesthesia Type:MAC  Level of Consciousness: awake, alert  and oriented  Airway & Oxygen Therapy: Patient Spontanous Breathing and Patient connected to nasal cannula oxygen  Post-op Assessment: Report given to RN and Post -op Vital signs reviewed and stable  Post vital signs: Reviewed and stable  Last Vitals:  Vitals:   01/05/20 0707  BP: 131/76  Pulse: 69  Resp: 16  Temp: 36.7 C  SpO2: 16%    Complications: No apparent anesthesia complications

## 2020-01-05 NOTE — Op Note (Signed)
[facility] Patient Name: Pamela Blair Procedure Date: 01/05/2020 MRN: [id number] Attending MD: Estill Cotta. Loletha Carrow , MD Date of Birth: 1941-01-30 CSN: [id number] Age: 79 Admit Type: Outpatient Procedure:                Small bowel enteroscopy Indications:              Iron deficiency anemia secondary to chronic blood                            loss, GI bleeding source not documented by previous                            colonoscopy Providers:                Estill Cotta. Loletha Carrow, MD, Wynonia Sours, RN, Lazaro Arms,                            Technician, Tyrone Apple, Technician, Eliberto Ivory, CRNA Referring MD:             Crist Infante, MD Medicines:                Monitored Anesthesia Care Complications:            No immediate complications. Estimated Blood Loss:     Estimated blood loss: none. Procedure:                Pre-Anesthesia Assessment:                           - Prior to the procedure, a History and Physical                            was performed, and patient medications and                            allergies were reviewed. The patient's tolerance of                            previous anesthesia was also reviewed. The risks                            and benefits of the procedure and the sedation                            options and risks were discussed with the patient.                            All questions were answered, and informed consent                            was obtained. Prior Anticoagulants: The patient has  taken no previous anticoagulant or antiplatelet                            agents except for aspirin. ASA Grade Assessment:                            III - A patient with severe systemic disease. After                            reviewing the risks and benefits, the patient was                            deemed in satisfactory condition to undergo the                             procedure.                           After obtaining informed consent, the endoscope was                            passed under direct vision. Throughout the                            procedure, the patient's blood pressure, pulse, and                            oxygen saturations were monitored continuously. The                            PCF-H190DL ([id number]) Olympus pediatric colonscope                            was introduced through the mouth, and advanced to                            the proximal jejunum. After obtaining informed                            consent, the endoscope was passed under direct                            vision. Throughout the procedure, the patient's                            blood pressure, pulse, and oxygen saturations were                            monitored continuously.The small bowel enteroscopy                            was accomplished without difficulty. The patient  tolerated the procedure well. Scope In: Scope Out: Findings:      The esophagus was normal.      The stomach was normal.      The examined duodenum was normal.      There was no evidence of significant pathology in the entire examined       portion of jejunum. Impression:               - Normal esophagus.                           - Normal stomach.                           - Normal examined duodenum.                           - The examined portion of the jejunum was normal.                           - No specimens collected. Recommendation:           - Patient has a contact number available for                            emergencies. The signs and symptoms of potential                            delayed complications were discussed with the                            patient. Return to normal activities tomorrow.                            Written discharge instructions were provided to the                            patient.                            - Resume previous diet.                           - Continue present medications, including oral iron.                           - To visualize the small bowel, perform video                            capsule endoscopy at appointment to be scheduled.                           - CBC and iron studies were drawn today - results                            pending.  Repeat CBC and iron studies in about 4 weeks.                           Return to Cardiology (Dr. Debara Pickett) to discuss                            management of multivessel CAD found on recent                            cardiac catheterization. If coronary stent and DAPT                            required, the risk of overt GI bleeding is likely                            low without a cause of anemia seen on this study or                            recent colonoscopy. Procedure Code(s):        --- Professional ---                           [phone], Small intestinal endoscopy, enteroscopy                            beyond second portion of duodenum, not including                            ileum; diagnostic, including collection of                            specimen(s) by brushing or washing, when performed                            (separate procedure) Diagnosis Code(s):        --- Professional ---                           D50.0, Iron deficiency anemia secondary to blood                            loss (chronic)                           K92.2, Gastrointestinal hemorrhage, unspecified CPT copyright 2019 American Medical Association. All rights reserved. The codes documented in this report are preliminary and upon coder review may  be revised to meet current compliance requirements. [name], MD 01/05/2020 7:55:24 AM This report has been signed electronically. Number of Addenda: 0

## 2020-01-09 ENCOUNTER — Encounter (HOSPITAL_COMMUNITY): Payer: Self-pay | Admitting: Gastroenterology

## 2020-01-11 ENCOUNTER — Other Ambulatory Visit: Payer: Self-pay

## 2020-01-11 DIAGNOSIS — D509 Iron deficiency anemia, unspecified: Secondary | ICD-10-CM

## 2020-01-11 NOTE — Progress Notes (Signed)
Attempted to call son to schedule capsule endo and received message that his voice mailbox is full and cannot accept messages at this time. Message sent via mychart for him to call the office to schedule the capsule endo.

## 2020-01-11 NOTE — Progress Notes (Signed)
Pt scheduled for capsule endo 01/19/20@8 :30am. Pts son aware of appt and instructions. Report faxed to pts PCP.

## 2020-01-12 ENCOUNTER — Telehealth: Payer: Self-pay

## 2020-01-12 NOTE — Telephone Encounter (Signed)
Pts son notified via mychart to have pt hold iron.

## 2020-01-12 NOTE — Telephone Encounter (Signed)
-----   Message from Doran Stabler, MD sent at 01/12/2020  7:51 AM EDT ----- Regarding: RE: Capsule endo Yes, please ----- Message ----- From: Algernon Huxley, RN Sent: 01/11/2020   4:22 PM EDT To: Nelida Meuse III, MD Subject: Capsule endo                                   Dr. Loletha Carrow,  Pts son wants to know if you want pt to hold her Iron for 7 days prior to capsule endo or if she should continue it. Please let me know.  Thanks, [employer]

## 2020-01-19 ENCOUNTER — Encounter: Payer: Self-pay | Admitting: Gastroenterology

## 2020-01-19 ENCOUNTER — Ambulatory Visit (INDEPENDENT_AMBULATORY_CARE_PROVIDER_SITE_OTHER): Payer: Medicare Other | Admitting: Gastroenterology

## 2020-01-19 DIAGNOSIS — D500 Iron deficiency anemia secondary to blood loss (chronic): Secondary | ICD-10-CM | POA: Diagnosis not present

## 2020-01-19 NOTE — Patient Instructions (Signed)

## 2020-01-19 NOTE — Progress Notes (Signed)
The pt arrived with her daughter at her appointed time.  She was seated in a patient room and asked if her prep for her Capsule endoscopy was effective and if she was ready to swallow the capsule. The pt confirmed prep was complete and she is ready to proceed with capsule.  We discussed her instructions for the day and to contact our office if she has any questions or concerns.  She was provided written documentation and kit with collection supplies. The pt was offered water with simethicone to swallow capsule; capsule was blinking prior to ingestion.  The pt swallowed without complication.  She was again advised to follow written and verbal instructions and told to call with any problems or concerns.  Time was offered for questions to the pt and her daughter.    LOT 07-27-23 SN [id number] EXP 2021-07-25

## 2020-02-22 ENCOUNTER — Encounter: Payer: Self-pay | Admitting: Internal Medicine

## 2020-02-22 ENCOUNTER — Ambulatory Visit (INDEPENDENT_AMBULATORY_CARE_PROVIDER_SITE_OTHER): Payer: Medicare Other | Admitting: Internal Medicine

## 2020-02-22 ENCOUNTER — Other Ambulatory Visit: Payer: Self-pay

## 2020-02-22 VITALS — BP 98/60 | HR 63 | Ht 60.0 in | Wt 136.4 lb

## 2020-02-22 DIAGNOSIS — E785 Hyperlipidemia, unspecified: Secondary | ICD-10-CM

## 2020-02-22 DIAGNOSIS — D509 Iron deficiency anemia, unspecified: Secondary | ICD-10-CM

## 2020-02-22 DIAGNOSIS — I2510 Atherosclerotic heart disease of native coronary artery without angina pectoris: Secondary | ICD-10-CM

## 2020-02-22 DIAGNOSIS — I10 Essential (primary) hypertension: Secondary | ICD-10-CM | POA: Diagnosis not present

## 2020-02-22 MED ORDER — ATORVASTATIN CALCIUM 40 MG PO TABS: 40.0000 mg | ORAL_TABLET | Freq: Every day | ORAL | 3 refills | Status: DC

## 2020-02-22 MED ORDER — AMLODIPINE BESYLATE 2.5 MG PO TABS: 2.5000 mg | ORAL_TABLET | Freq: Every day | ORAL | 3 refills | Status: AC

## 2020-02-22 NOTE — Patient Instructions (Addendum)
Medication Instructions:  DECREASE atorvastatin to 40mg  daily  DECREASE amlodipine to 2.5mg  daily  Dr. Debara Pickett recommends Repatha or Praluent (PCSK9). This is an injectable cholesterol medication self-administered once every 14 days. This medication will likely need prior approval with your insurance company, which we will work on. If the medication is not approved initially, we may need to do an appeal with your insurance. We will keep you updated on this process.   Administer medication in area of fatty tissue such as abdomen, outer thigh, back up of arm - and rotate site with each injection Store medication in refrigerator until ready to administer - allow to sit at room temp for 30 mins - 1 hour prior to injection Dispose of medication in a SHARPS container - your pharmacy should be able to direct you on this and proper disposal   If you need co-pay assistance grant, please look into the program at healthwellfoundation.org >> disease funds >> hypercholesterolemia. This is an online application or you can call to complete. Once approved, you will provide the "pharmacy card" information to your pharmacy and they will deduct the co-pays from this grant.  If you need a co-pay card for Repatha: [email or web address] >> paying for Repatha or red box that says "Repatha Copay Card" in top right If you need a co-pay card for Praluent: WedMap.it >> starting & paying for Praluent  *If you need a refill on your cardiac medications before your next appointment, please call your pharmacy*   Lab Work: FASTING lipid panel in 3-4 months to check cholesterol  -- please complete about 1 week before your next visit with Dr. Debara Pickett  If you have labs (blood work) drawn today and your tests are completely normal, you will receive your results only by: Marland Kitchen MyChart Message (if you have MyChart) OR . A paper copy in the mail If you have any lab test that is abnormal or we need to change your treatment, we will call you to  review the results.   Testing/Procedures: NONE   Follow-Up: At [facility], you and your health needs are our priority.  As part of our continuing mission to provide you with exceptional heart care, we have created designated Provider Care Teams.  These Care Teams include your primary Cardiologist (physician) and Advanced Practice Providers (APPs -  Physician Assistants and Nurse Practitioners) who all work together to provide you with the care you need, when you need it.  We recommend signing up for the patient portal called "MyChart".  Sign up information is provided on this After Visit Summary.  MyChart is used to connect with patients for Virtual Visits (Telemedicine).  Patients are able to view lab/test results, encounter notes, upcoming appointments, etc.  Non-urgent messages can be sent to your provider as well.   To learn more about what you can do with MyChart, go to [email or web address].    Your next appointment:   3-4 month(s)  The format for your next appointment:   In Person  Provider:   K. Mali Hilty, MD   Other Instructions

## 2020-02-22 NOTE — Progress Notes (Signed)
OFFICE NOTE  Chief Complaint:  Follow-up chest discomfort  Primary Care Physician: Pamela Infante, MD  HPI:  Pamela Blair is a pleasant 79 year old female who was present followed by Dr. Linna Blair and currently is seeing Dr. Hardie Blair. She was referred for evaluation of chest pain per Dr. Huntley Blair notes, however she is "not clear why she is here". She occasionally has some chest discomfort in fact she notes some pain in her left upper chest and left shoulder blade particular when moving and lifting things. She gets pain it goes down her left arm. She does have a history of neck surgery from an anterior approach. 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 She does have a prior spinal compression fracture. In addition she seemed to have aortic atherosclerosis on imaging. A family history is not significant for any coronary disease. She does have some Arctic risk factors including dyslipidemia and was recently started on pravastatin. She was also prescribed etodolac for arthritis, but she never got the prescription filled and is not taking it. Her chest pain is not necessarily worse with exertion or relieved by rest. She does report some increasing shortness of breath which is unusual for her.  10/24/2019  [name] returns today for follow-up.  I last saw her in 2016 and therefore she is considered a new patient.  In the past she had undergone some testing for presumed angina although she had no complaints at that time.  Today she is also without any significant symptoms.  She reported that she had some unusual symptoms that were concerning for possible heart disease.  She has been under a lot of stress as her husband apparently died over the holidays from COVID-19.  She says she was out of town and was able to come back with her son who was a Engineer, drilling.  They were living in [place].  She is describing more shortness of breath and fatigue and seems to be  quite emotional at this point.  She denies any pain in the chest.  She also feels some palpitations at times or symptoms of her heart racing.  02/22/2020  Pamela Blair returns today for follow-up from left heart catheterization.  She had been complaining of symptoms concerning for coronary disease and I recommended a CT coronary angiogram.  I personally interpreted this study which demonstrated least moderate mixed proximal LAD stenosis.  The calcium score was significantly elevated at 901, 92nd percentile for age and sex matched control.  Additionally, her FFR demonstrated significant flow limitation in the proximal to mid LAD as suspected and possibly the mid circumflex artery.  Subsequently she was referred for definitive cardiac catheterization on 12/05/2019.  This demonstrated multivessel coronary artery calcification with 75% proximal diagonal ostial stenosis, 60% proximal LAD stenosis and diffuse 7080% stenosis in the mid LAD.  There was an 80% focal stenosis in left circumflex and 60% diffuse stenosis in a large OM vessel.  LVEF was 55%.  Based on the cath findings and the fact that she was not on anti-ischemic therapy, she was placed on metoprolol and amlodipine and given her recent anemia, further work-up was recommended.  She did undergo significant GI work-up by Pamela Blair due to heme positive stool, including colonoscopy in 11/2019, endoscopy and capsule endoscopy which showed one small area of erosion in the small bowel but no clear sources of her anemia.  She was subsequently transfused and provided iron which she remains on.  Anemia has significantly improved from 8.7 hemoglobin 2 months ago to 12.5.,  Iron, TIBC and trans sat ratios are now normal.  In the office today she reports that she feels better.  She does not have any of the typical anginal symptoms she was describing previously.  EKG is normal sinus rhythm without ischemia today.  Blood pressure is noted to be low at 98/60 and more recently she  has had some lower blood pressures based on home readings.  In addition she has been complaining of bilateral leg pain and cramping which started after increasing her atorvastatin from 40 to 80 mg daily.  That being said her most recent LDL in September 2020 was 150.  She will likely need more definitive therapy to reach a target LDL less than 70.  PMHx:  Past Medical History:  Diagnosis Date  . Blood transfusion without reported diagnosis   . Cervical spinal stenosis   . Cervical spondylosis   . Hyperlipidemia   . Osteoporosis     Past Surgical History:  Procedure Laterality Date  . ABDOMINAL HYSTERECTOMY     and BSO, dx- dysfunctional bleeding  . ANTERIOR CERVICAL DECOMP/DISCECTOMY FUSION  10/3-10/2010   Dr Pamala Hurry , [place] , [place]   . CESAREAN SECTION     x 4  . COLONOSCOPY     Dr Deatra Ina  . ENTEROSCOPY N/A 01/05/2020   Procedure: ENTEROSCOPY;  Surgeon: Doran Stabler, MD;  Location: [facility];  Service: Gastroenterology;  Laterality: N/A;  . HOT HEMOSTASIS N/A 01/05/2020   Procedure: HOT HEMOSTASIS (ARGON PLASMA COAGULATION/BICAP);  Surgeon: Doran Stabler, MD;  Location: [facility];  Service: Gastroenterology;  Laterality: N/A;  . 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 LEFT HEART CATH AND CORONARY ANGIOGRAPHY N/A 12/05/2019   Procedure: LEFT HEART CATH AND CORONARY ANGIOGRAPHY;  Surgeon: Troy Sine, MD;  Location: [facility];  Service: Cardiovascular;  Laterality: N/A;  . TOTAL KNEE ARTHROPLASTY      FAMHx:  Family History  Problem Relation Age of Onset  . Stroke Mother 40  . Heart attack Father 28  . Diabetes Neg Hx   . Cancer Neg Hx   . Colon cancer Neg Hx   . Colon polyps Neg Hx   . Stomach cancer Neg Hx   . Esophageal cancer Neg Hx     SOCHx:   reports that she has never smoked. She has never used smokeless tobacco. She reports that she does not drink alcohol and does not use drugs.  ALLERGIES:    Allergies  Allergen Reactions  . Niacin     rash  . Clarithromycin Other (See Comments)    unknown reaction  . Neomycin-Bacitracin-Polymyxin  [Bacitracin-Neomycin-Polymyxin]   . Neomycin Rash    ROS: Pertinent items noted in HPI and remainder of comprehensive ROS otherwise negative.  HOME MEDS: Current Outpatient Medications  Medication Sig Dispense Refill  . acetaminophen (TYLENOL) 500 MG tablet Take 500-1,000 mg by mouth every 6 (six) hours as needed for mild pain or moderate pain.     Marland Kitchen amLODipine (NORVASC) 5 MG tablet Take 1 tablet (5 mg total) by mouth daily. 30 tablet 6  . aspirin EC 81 MG tablet Take 81 mg by mouth at bedtime.     Marland Kitchen atorvastatin (LIPITOR) 80 MG tablet Take 1 tablet (80 mg total) by mouth daily. 90 tablet 3  . b complex vitamins tablet Take 1  tablet by mouth daily.    . bimatoprost (LUMIGAN) 0.01 % SOLN Place 1 drop into both eyes at bedtime.    Marland Kitchen buPROPion (WELLBUTRIN XL) 150 MG 24 hr tablet Take 150 mg by mouth every morning.    . Calcium Carbonate Antacid (CALCIUM CARBONATE PO) Take 1,000 mg by mouth daily.     Marland Kitchen CALCIUM CITRATE PO Take 1,000 mg by mouth daily.    . Cholecalciferol (VITAMIN D3) 2000 UNITS capsule Take 1 capsule (2,000 Units total) by mouth daily. 100 capsule 3  . Coenzyme Q10 (COQ10 PO) Take 20 mg by mouth daily at 12 noon.   0  . diclofenac Sodium (VOLTAREN) 1 % GEL Apply 2 g topically at bedtime as needed (Hip Pain).    Marland Kitchen ezetimibe (ZETIA) 10 MG tablet Take 10 mg by mouth at bedtime.     . ferrous sulfate 325 (65 FE) MG tablet Take 325 mg by mouth daily with breakfast.    . Glycerin-Hypromellose-PEG 400 (DRY EYE RELIEF DROPS) 0.2-0.2-1 % SOLN Place 1 drop into both eyes daily as needed (Dry eye).    Marland Kitchen MAGNESIUM PO Take 1 tablet by mouth daily.     . metoprolol succinate (TOPROL XL) 25 MG 24 hr tablet Take 1 tablet (25 mg total) by mouth daily. 30 tablet 3  . nitroGLYCERIN (NITROSTAT) 0.4 MG SL tablet Place 1 tablet (0.4 mg total) under  the tongue every 5 (five) minutes as needed for chest pain. 25 tablet 3  . omega-3 fish oil (MAXEPA) 1000 MG CAPS capsule Take 3,000 mg by mouth daily.     . pantoprazole (PROTONIX) 40 MG tablet Take 40 mg by mouth daily.    . TURMERIC PO Take 1 capsule by mouth daily at 12 noon.     No current facility-administered medications for this visit.    LABS/IMAGING: No results found for this or any previous visit (from the past 48 hour(s)). No results found.  WEIGHTS: Wt Readings from Last 3 Encounters:  02/22/20 136 lb 6.4 oz (61.9 kg)  01/05/20 138 lb 0.1 oz (62.6 kg)  12/27/19 138 lb (62.6 kg)    VITALS: BP 98/60   Pulse 63   Ht 5' (1.524 m)   Wt 136 lb 6.4 oz (61.9 kg)   BMI 26.64 kg/m   EXAM: General appearance: alert and no distress Neck: no carotid bruit and no JVD Lungs: clear to auscultation bilaterally Heart: regular rate and rhythm, S1, S2 normal, no murmur, click, rub or gallop Abdomen: soft, non-tender; bowel sounds normal; no masses,  no organomegaly Extremities: Reproducible left upper chest and left upper scapular pain on palpation Pulses: 2+ and symmetric Skin: Skin color, texture, turgor normal. No rashes or lesions Neurologic: Grossly normal Psych: Pleasant  EKG: Normal sinus rhythm at 63-personally reviewed  ASSESSMENT: 1. 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 Progressive dyspnea on exertion, palpitations, fatigue - suspect due to CAD/anemia 3. Anemia - likely chronic, no clear GI blood loss source, improved with tranfusion/iron 4. Aortic atherosclerosis 5. Cervical and thoracic spine disease 6. 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   Mrs. Deloach seems to have had symptomatic improvement in her angina.  Despite this she had moderate to severe multivessel coronary disease.  She is now on medical therapy including aspirin, statin, beta-blocker and calcium channel blocker, following  management recommendations from the ISCHEMIA trial.  Recently she has had some lower blood pressures and advised decreasing her amlodipine  from 5 to 2.5 mg daily.  In addition she has had worsening leg cramps and pain after titrating her atorvastatin from 40 to 80 mg daily.  It is unlikely that this small increase will help her reach a target LDL less than 70 given her more recent LDL of 150 on 40 mg of atorvastatin.  Based on that and statin intolerance, she is currently on max tolerated high potency statin and ezetimibe and would benefit from the addition of a PCSK9 inhibitor to reach goal LDL <70.  Will plan a close follow-up in about 3 months.  Pixie Casino, MD, [facility], [place] Director of the Advanced Lipid Disorders &  Cardiovascular Risk Reduction Clinic Diplomate of the American Board of Clinical Lipidology Attending Cardiologist  Direct Dial: [phone]  Fax: [phone]  Website:  [email or web address] 02/22/2020, 3:43 PM

## 2020-02-23 ENCOUNTER — Encounter: Payer: Self-pay | Admitting: Internal Medicine

## 2020-02-26 ENCOUNTER — Telehealth: Payer: Self-pay | Admitting: Internal Medicine

## 2020-02-26 NOTE — Telephone Encounter (Signed)
Repatha has been denied as patient has NOT had labs within last 120 days: (1) One of the following low-density lipoprotein cholesterol (LDL-C) values while on maximally tolerated lipid lowering therapy within the last 120 days: (A) LDL-C greater than or equal to 70 mg/dL with atherosclerotic cardiovascular disease; OR (B) LDL-C greater than or equal to 100 mg/dL without atherosclerotic cardiovascular disease. Reviewed by: Reina Fuse with son and he will bring patient by for lab work

## 2020-02-26 NOTE — Telephone Encounter (Signed)
PA for [employer] submitted via [place] (Key: [id number])  ID: [id number] BIN: [id number] PCN: [id number] RxGrp: [id number]

## 2020-02-28 ENCOUNTER — Other Ambulatory Visit: Payer: Self-pay | Admitting: Medical

## 2020-02-28 LAB — LIPID PANEL
Chol/HDL Ratio: 3.2 ratio (ref 0.0–4.4)
Cholesterol, Total: 138 mg/dL (ref 100–199)
HDL: 43 mg/dL (ref >39)
LDL Chol Calc (NIH): 80 mg/dL (ref 0–99)
Triglycerides: 78 mg/dL (ref 0–149)
VLDL Cholesterol Cal: 15 mg/dL (ref 5–40)

## 2020-02-29 ENCOUNTER — Encounter: Payer: Self-pay | Admitting: *Deleted

## 2020-02-29 NOTE — Telephone Encounter (Signed)
Lab work completed. Appeals letter composed.   Appeal info (MD note, labs, letter) faxed to [place] Department @ [phone]

## 2020-03-01 ENCOUNTER — Telehealth: Payer: Self-pay | Admitting: Internal Medicine

## 2020-03-01 MED ORDER — REPATHA SURECLICK 140 MG/ML ~~LOC~~ SOAJ: 1.0000 | SUBCUTANEOUS | 11 refills | Status: AC

## 2020-03-01 NOTE — Telephone Encounter (Signed)
Patient notified via MyChart Rx sent to [facility]

## 2020-03-01 NOTE — Telephone Encounter (Signed)
Appeal to her Repatha, it was approved. Auth # [id number]

## 2020-03-07 NOTE — Telephone Encounter (Signed)
mychart message reply sent to patient

## 2020-03-07 NOTE — Telephone Encounter (Signed)
LDL remains above 70 - lipids are not bad - will be much lower on PCSK9 and may be able to back off on statin to see if it improves some side effects.  Dr Lemmie Evens

## 2020-05-08 ENCOUNTER — Ambulatory Visit: Payer: Medicare Other | Admitting: Internal Medicine

## 2020-05-08 ENCOUNTER — Encounter: Payer: Self-pay | Admitting: Internal Medicine

## 2020-05-08 ENCOUNTER — Other Ambulatory Visit: Payer: Self-pay

## 2020-05-08 ENCOUNTER — Ambulatory Visit (INDEPENDENT_AMBULATORY_CARE_PROVIDER_SITE_OTHER): Payer: Medicare Other | Admitting: Internal Medicine

## 2020-05-08 VITALS — BP 150/80 | HR 72 | Ht 59.0 in | Wt 140.0 lb

## 2020-05-08 DIAGNOSIS — D509 Iron deficiency anemia, unspecified: Secondary | ICD-10-CM

## 2020-05-08 DIAGNOSIS — I2510 Atherosclerotic heart disease of native coronary artery without angina pectoris: Secondary | ICD-10-CM | POA: Diagnosis not present

## 2020-05-08 DIAGNOSIS — E785 Hyperlipidemia, unspecified: Secondary | ICD-10-CM

## 2020-05-08 NOTE — Progress Notes (Signed)
OFFICE NOTE  Chief Complaint:  Follow-up  Primary Care Physician: Crist Infante, MD  HPI:  Pamela Blair is a pleasant 79 year old female who was present followed by Dr. Linna Darner and currently is seeing Dr. Hardie Shackleton. She was referred for evaluation of chest pain per Dr. Huntley Estelle notes, however she is "not clear why she is here". She occasionally has some chest discomfort in fact she notes some pain in her left upper chest and left shoulder blade particular when moving and lifting things. She gets pain it goes down her left arm. She does have a history of neck surgery from an anterior approach. 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 She does have a prior spinal compression fracture. In addition she seemed to have aortic atherosclerosis on imaging. A family history is not significant for any coronary disease. She does have some Arctic risk factors including dyslipidemia and was recently started on pravastatin. She was also prescribed etodolac for arthritis, but she never got the prescription filled and is not taking it. Her chest pain is not necessarily worse with exertion or relieved by rest. She does report some increasing shortness of breath which is unusual for her.  10/24/2019  Pamela Blair returns today for follow-up.  I last saw her in 2016 and therefore she is considered a new patient.  In the past she had undergone some testing for presumed angina although she had no complaints at that time.  Today she is also without any significant symptoms.  She reported that she had some unusual symptoms that were concerning for possible heart disease.  She has been under a lot of stress as her husband apparently died over the holidays from COVID-19.  She says she was out of town and was able to come back with her son who was a Engineer, drilling.  They were living in [place].  She is describing more shortness of breath and fatigue and seems to be quite emotional at  this point.  She denies any pain in the chest.  She also feels some palpitations at times or symptoms of her heart racing.  02/22/2020  Pamela Blair returns today for follow-up from left heart catheterization.  She had been complaining of symptoms concerning for coronary disease and I recommended a CT coronary angiogram.  I personally interpreted this study which demonstrated least moderate mixed proximal LAD stenosis.  The calcium score was significantly elevated at 901, 92nd percentile for age and sex matched control.  Additionally, her FFR demonstrated significant flow limitation in the proximal to mid LAD as suspected and possibly the mid circumflex artery.  Subsequently she was referred for definitive cardiac catheterization on 12/05/2019.  This demonstrated multivessel coronary artery calcification with 75% proximal diagonal ostial stenosis, 60% proximal LAD stenosis and diffuse 7080% stenosis in the mid LAD.  There was an 80% focal stenosis in left circumflex and 60% diffuse stenosis in a large OM vessel.  LVEF was 55%.  Based on the cath findings and the fact that she was not on anti-ischemic therapy, she was placed on metoprolol and amlodipine and given her recent anemia, further work-up was recommended.  She did undergo significant GI work-up by Dr. Loletha Carrow due to heme positive stool, including colonoscopy in 11/2019, endoscopy and capsule endoscopy which showed one small area of erosion in the small bowel but no clear sources of her anemia.  She was subsequently transfused and provided iron which she remains on.  Anemia has  significantly improved from 8.7 hemoglobin 2 months ago to 12.5.,  Iron, TIBC and trans sat ratios are now normal.  In the office today she reports that she feels better.  She does not have any of the typical anginal symptoms she was describing previously.  EKG is normal sinus rhythm without ischemia today.  Blood pressure is noted to be low at 98/60 and more recently she has had some lower  blood pressures based on home readings.  In addition she has been complaining of bilateral leg pain and cramping which started after increasing her atorvastatin from 40 to 80 mg daily.  That being said her most recent LDL in September 2020 was 150.  She will likely need more definitive therapy to reach a target LDL less than 70.  05/08/2020  Pamela Blair returns today for follow-up with her son.  She reports no symptoms of chest pain or worsening shortness of breath.  Apparently her hemoglobin has improved somewhat and she had had transfusion and remains on iron.  Recent lipids showed improvement with total cholesterol 138, triglycerides 78, HDL 43 and LDL of 180.  She just started on Repatha, having taken her first dose this week.  Apparently her PCP had instructed them to only use Repatha once monthly because of the concern of a very low LDL cholesterol.  I also think that her LDL will be quite low however there is no study that I am aware of demonstrating the efficacy of once monthly PCSK9 inhibitors-in fact the pharmacokinetics suggest that during the month in between doses, the cholesterol is likely to rise back to the pretreatment levels.  Ultimately though, I think we could consider discontinuing her ezetimibe or reducing her statin dose.  PMHx:  Past Medical History:  Diagnosis Date  . Blood transfusion without reported diagnosis   . Cervical spinal stenosis   . Cervical spondylosis   . Hyperlipidemia   . Osteoporosis     Past Surgical History:  Procedure Laterality Date  . ABDOMINAL HYSTERECTOMY     and BSO, dx- dysfunctional bleeding  . ANTERIOR CERVICAL DECOMP/DISCECTOMY FUSION  10/3-10/2010   Dr Pamala Hurry , [place] , [place]   . CESAREAN SECTION     x 4  . COLONOSCOPY     Dr Deatra Ina  . ENTEROSCOPY N/A 01/05/2020   Procedure: ENTEROSCOPY;  Surgeon: Doran Stabler, MD;  Location: [facility];  Service: Gastroenterology;  Laterality: N/A;  . HOT HEMOSTASIS N/A 01/05/2020   Procedure: HOT  HEMOSTASIS (ARGON PLASMA COAGULATION/BICAP);  Surgeon: Doran Stabler, MD;  Location: [facility];  Service: Gastroenterology;  Laterality: N/A;  . 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 LEFT HEART CATH AND CORONARY ANGIOGRAPHY N/A 12/05/2019   Procedure: LEFT HEART CATH AND CORONARY ANGIOGRAPHY;  Surgeon: Troy Sine, MD;  Location: [facility];  Service: Cardiovascular;  Laterality: N/A;  . TOTAL KNEE ARTHROPLASTY      FAMHx:  Family History  Problem Relation Age of Onset  . Stroke Mother 78  . Heart attack Father 57  . Diabetes Neg Hx   . Cancer Neg Hx   . Colon cancer Neg Hx   . Colon polyps Neg Hx   . Stomach cancer Neg Hx   . Esophageal cancer Neg Hx     SOCHx:   reports that she has never smoked. She has never used smokeless tobacco. She reports that she does not drink alcohol and  does not use drugs.  ALLERGIES:  Allergies  Allergen Reactions  . Niacin     rash  . Clarithromycin Other (See Comments)    unknown reaction  . Neomycin-Bacitracin-Polymyxin  [Bacitracin-Neomycin-Polymyxin]   . Neomycin Rash    ROS: Pertinent items noted in HPI and remainder of comprehensive ROS otherwise negative.  HOME MEDS: Current Outpatient Medications  Medication Sig Dispense Refill  . acetaminophen (TYLENOL) 500 MG tablet Take 500-1,000 mg by mouth every 6 (six) hours as needed for mild pain or moderate pain.     Marland Kitchen amLODipine (NORVASC) 2.5 MG tablet Take 1 tablet (2.5 mg total) by mouth daily. 90 tablet 3  . aspirin EC 81 MG tablet Take 81 mg by mouth at bedtime.     Marland Kitchen atorvastatin (LIPITOR) 40 MG tablet Take 1 tablet (40 mg total) by mouth daily. 90 tablet 3  . b complex vitamins tablet Take 1 tablet by mouth daily.    . bimatoprost (LUMIGAN) 0.01 % SOLN Place 1 drop into both eyes at bedtime.    Marland Kitchen buPROPion (WELLBUTRIN XL) 150 MG 24 hr tablet Take 150 mg by mouth every morning.    . Calcium Carbonate Antacid  (CALCIUM CARBONATE PO) Take 1,000 mg by mouth daily.     Marland Kitchen CALCIUM CITRATE PO Take 1,000 mg by mouth daily.    . Cholecalciferol (VITAMIN D3) 2000 UNITS capsule Take 1 capsule (2,000 Units total) by mouth daily. 100 capsule 3  . Coenzyme Q10 (COQ10 PO) Take 20 mg by mouth daily at 12 noon.   0  . diclofenac Sodium (VOLTAREN) 1 % GEL Apply 2 g topically at bedtime as needed (Hip Pain).    . Evolocumab (REPATHA SURECLICK) 673 MG/ML SOAJ Inject 1 Dose into the skin every 14 (fourteen) days. 2.1 mL 11  . ezetimibe (ZETIA) 10 MG tablet Take 10 mg by mouth at bedtime.     . ferrous sulfate 325 (65 FE) MG tablet Take 325 mg by mouth daily with breakfast.    . Glycerin-Hypromellose-PEG 400 (DRY EYE RELIEF DROPS) 0.2-0.2-1 % SOLN Place 1 drop into both eyes daily as needed (Dry eye).    Marland Kitchen MAGNESIUM PO Take 1 tablet by mouth daily.     . metoprolol succinate (TOPROL-XL) 25 MG 24 hr tablet TAKE 1 TABLET BY MOUTH EVERY DAY 90 tablet 3  . omega-3 fish oil (MAXEPA) 1000 MG CAPS capsule Take 3,000 mg by mouth daily.     . pantoprazole (PROTONIX) 40 MG tablet Take 40 mg by mouth daily.    . TURMERIC PO Take 1 capsule by mouth daily at 12 noon.    . nitroGLYCERIN (NITROSTAT) 0.4 MG SL tablet Place 1 tablet (0.4 mg total) under the tongue every 5 (five) minutes as needed for chest pain. 25 tablet 3   No current facility-administered medications for this visit.    LABS/IMAGING: No results found for this or any previous visit (from the past 48 hour(s)). No results found.  WEIGHTS: Wt Readings from Last 3 Encounters:  05/08/20 140 lb (63.5 kg)  02/22/20 136 lb 6.4 oz (61.9 kg)  01/05/20 138 lb 0.1 oz (62.6 kg)    VITALS: BP (!) 150/80   Pulse 72   Ht 4\' 11"  (1.499 m)   Wt 140 lb (63.5 kg)   BMI 28.28 kg/m   EXAM: General appearance: alert and no distress Neck: no carotid bruit and no JVD Lungs: clear to auscultation bilaterally Heart: regular rate and rhythm, S1, S2 normal, no  murmur, click, rub  or gallop Abdomen: soft, non-tender; bowel sounds normal; no masses,  no organomegaly Extremities: Reproducible left upper chest and left upper scapular pain on palpation Pulses: 2+ and symmetric Skin: Skin color, texture, turgor normal. No rashes or lesions Neurologic: Grossly normal Psych: Pleasant  EKG: Normal sinus rhythm at 72, nonspecific ST and T wave changes-personally reviewed  ASSESSMENT: 1. 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 Progressive dyspnea on exertion, palpitations, fatigue - suspect due to CAD/anemia 3. Anemia - likely chronic, no clear GI blood loss source, improved with tranfusion/iron 4. Aortic atherosclerosis 5. Cervical and thoracic spine disease 6. 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   Pamela Blair just started on Repatha and may likely be able to come off of her ezetimibe or have reduction in her statin once we see where her cholesterol levels out.  I would advise continuing all of her meds at this point as there is likely no significant issues that would be associated with very low cholesterol.  If she is not a complete responder however her LDL may not come down the typical 60 to 65% and that is why I would be hesitant to stop or adjust her other medications until were clear about the degree of lipid-lowering we will see with her Repatha.  Fortunately she remains asymptomatic.  She is on the look out for any symptoms that may necessitate a repeat catheterization or possibly intervention.  Plan follow-up with me in 4 months with repeat lipids when she returns from a trip to [place] to visit with her son.  Pixie Casino, MD, [facility], [place] Director of the Advanced Lipid Disorders &  Cardiovascular Risk Reduction Clinic Diplomate of the American Board of Clinical Lipidology Attending Cardiologist  Direct Dial: [phone]  Fax: [phone]  Website:   [email or web address] 05/08/2020, 1:48 PM

## 2020-05-08 NOTE — Patient Instructions (Signed)
Medication Instructions:  Increase Evolovuab (repatha) once every 14 days. *If you need a refill on your cardiac medications before your next appointment, please call your pharmacy*   Lab Work: Your physician recommends that you return for lab work in: approximately 4 months for lipid panel.  If you have labs (blood work) drawn today and your tests are completely normal, you will receive your results only by: Marland Kitchen MyChart Message (if you have MyChart) OR . A paper copy in the mail If you have any lab test that is abnormal or we need to change your treatment, we will call you to review the results.  Follow-Up: At [facility], you and your health needs are our priority.  As part of our continuing mission to provide you with exceptional heart care, we have created designated Provider Care Teams.  These Care Teams include your primary Cardiologist (physician) and Advanced Practice Providers (APPs -  Physician Assistants and Nurse Practitioners) who all work together to provide you with the care you need, when you need it.  We recommend signing up for the patient portal called "MyChart".  Sign up information is provided on this After Visit Summary.  MyChart is used to connect with patients for Virtual Visits (Telemedicine).  Patients are able to view lab/test results, encounter notes, upcoming appointments, etc.  Non-urgent messages can be sent to your provider as well.   To learn more about what you can do with MyChart, go to [email or web address].    Your next appointment:   4 month(s)  The format for your next appointment:   In Person  Provider:   C. Hilty, MD

## 2020-05-28 ENCOUNTER — Ambulatory Visit: Payer: Medicare Other | Admitting: Internal Medicine

## 2020-06-30 ENCOUNTER — Other Ambulatory Visit: Payer: Self-pay | Admitting: Medical

## 2020-07-12 DIAGNOSIS — Z20822 Contact with and (suspected) exposure to covid-19: Secondary | ICD-10-CM | POA: Diagnosis not present

## 2020-07-15 ENCOUNTER — Other Ambulatory Visit (HOSPITAL_COMMUNITY): Payer: Self-pay | Admitting: *Deleted

## 2020-07-17 ENCOUNTER — Other Ambulatory Visit: Payer: Self-pay

## 2020-07-17 ENCOUNTER — Ambulatory Visit (HOSPITAL_COMMUNITY)
Admission: RE | Admit: 2020-07-17 | Discharge: 2020-07-17 | Disposition: A | Discharge status: Home / Self Care | Payer: Medicare Other | Source: Ambulatory Visit | Attending: Internal Medicine | Admitting: Internal Medicine

## 2020-07-17 DIAGNOSIS — M810 Age-related osteoporosis without current pathological fracture: Secondary | ICD-10-CM | POA: Diagnosis not present

## 2020-07-17 MED ORDER — DENOSUMAB 60 MG/ML ~~LOC~~ SOSY
PREFILLED_SYRINGE | SUBCUTANEOUS | Status: AC
  Administered 2020-07-17: 60 mg via SUBCUTANEOUS
  Filled 2020-07-17: qty 1

## 2020-07-17 MED ORDER — DENOSUMAB 60 MG/ML ~~LOC~~ SOSY: 60.0000 mg | PREFILLED_SYRINGE | Freq: Once | SUBCUTANEOUS | Status: AC

## 2020-07-29 ENCOUNTER — Other Ambulatory Visit: Payer: Self-pay | Admitting: Internal Medicine

## 2020-07-29 ENCOUNTER — Telehealth: Payer: Self-pay | Admitting: Internal Medicine

## 2020-07-29 DIAGNOSIS — Z1231 Encounter for screening mammogram for malignant neoplasm of breast: Secondary | ICD-10-CM

## 2020-07-29 NOTE — Telephone Encounter (Signed)
Pt called in and stated she is having some concerns regarding a little SOB.  She denies SOB right now   Pt c/o Shortness Of Breath: STAT if SOB developed within the last 24 hours or pt is noticeably SOB on the phone  1. Are you currently SOB (can you hear that pt is SOB on the phone)? No  2. How long have you been experiencing SOB? 2 weeks or a little more   3. Are you SOB when sitting or when up moving around?  Sitting down is worse.  If she is active she feels better   4. Are you currently experiencing any other symptoms? Pt denies any other symptoms   Her bp is good.  She take it morning and evening,  She would like to get in to see Dr Debara Pickett only as soon has possible   Best number [phone]

## 2020-07-29 NOTE — Telephone Encounter (Signed)
Returned call to patient who states that she has been having shortness of breath for the last two weeks. Patient states that her breathing has been off for the last two weeks but is unsure of what is causing it. Patient states her blood pressure has been great (could not provide readings), patient states that her heart rate has been 70's-80's and oxygen level 99%. Patient states that she has also had some pain in her left arm, hand, and side. Patient denies any chest pain, swelling, dizziness, or weight gain. Patient would like to make an appointment to an appointment to address concerns. Advised patient that Dr. Debara Pickett did not have any open slots this week but there was APP availability. Made patient an appointment tomorrow 1/25 with Kerin Ransom PA-C at 3:15pm. Advised patient that in then event that symptoms worsen or onset of chest pain to report to the ED for evaluation. Patient verbalized understanding of all instructions.

## 2020-07-30 ENCOUNTER — Encounter: Payer: Self-pay | Admitting: Cardiology

## 2020-07-30 ENCOUNTER — Other Ambulatory Visit: Payer: Self-pay

## 2020-07-30 ENCOUNTER — Ambulatory Visit (INDEPENDENT_AMBULATORY_CARE_PROVIDER_SITE_OTHER): Payer: Medicare Other | Admitting: Cardiology

## 2020-07-30 VITALS — BP 124/70 | HR 74 | Ht 59.0 in | Wt 140.8 lb

## 2020-07-30 DIAGNOSIS — Z79899 Other long term (current) drug therapy: Secondary | ICD-10-CM | POA: Diagnosis not present

## 2020-07-30 DIAGNOSIS — D649 Anemia, unspecified: Secondary | ICD-10-CM

## 2020-07-30 DIAGNOSIS — R0600 Dyspnea, unspecified: Secondary | ICD-10-CM | POA: Diagnosis not present

## 2020-07-30 DIAGNOSIS — E785 Hyperlipidemia, unspecified: Secondary | ICD-10-CM

## 2020-07-30 DIAGNOSIS — I2510 Atherosclerotic heart disease of native coronary artery without angina pectoris: Secondary | ICD-10-CM | POA: Insufficient documentation

## 2020-07-30 DIAGNOSIS — I25119 Atherosclerotic heart disease of native coronary artery with unspecified angina pectoris: Secondary | ICD-10-CM

## 2020-07-30 DIAGNOSIS — R0609 Other forms of dyspnea: Secondary | ICD-10-CM

## 2020-07-30 MED ORDER — ISOSORBIDE MONONITRATE ER 30 MG PO TB24: 30.0000 mg | ORAL_TABLET | Freq: Every day | ORAL | 6 refills | Status: DC

## 2020-07-30 NOTE — Assessment & Plan Note (Signed)
GI work up by Dr Loletha Carrow- no clear source- treated with transfusion and iron Rx.

## 2020-07-30 NOTE — Patient Instructions (Signed)
Medication Instructions:  START- Isosorbide 30 mg by mouth daily  *If you need a refill on your cardiac medications before your next appointment, please call your pharmacy*   Lab Work: CBC and BMP  If you have labs (blood work) drawn today and your tests are completely normal, you will receive your results only by: Marland Kitchen MyChart Message (if you have MyChart) OR . A paper copy in the mail If you have any lab test that is abnormal or we need to change your treatment, we will call you to review the results.   Testing/Procedures: None Ordered   Follow-Up: At [facility], you and your health needs are our priority.  As part of our continuing mission to provide you with exceptional heart care, we have created designated Provider Care Teams.  These Care Teams include your primary Cardiologist (physician) and Advanced Practice Providers (APPs -  Physician Assistants and Nurse Practitioners) who all work together to provide you with the care you need, when you need it.  We recommend signing up for the patient portal called "MyChart".  Sign up information is provided on this After Visit Summary.  MyChart is used to connect with patients for Virtual Visits (Telemedicine).  Patients are able to view lab/test results, encounter notes, upcoming appointments, etc.  Non-urgent messages can be sent to your provider as well.   To learn more about what you can do with MyChart, go to [email or web address].    Your next appointment:   Thursday February 3rd @ 9:15 am  The format for your next appointment:   Virtual Visit   Provider:   You will see one of the following Advanced Practice Providers on your designated Care Team:    Kerin Ransom, [place]

## 2020-07-30 NOTE — Assessment & Plan Note (Signed)
Moderate CAD at cath June 2021- medical rx recommended then secondary to anemia

## 2020-07-30 NOTE — Progress Notes (Signed)
Cardiology Office Note:    Date:  07/30/2020   ID:  Pamela, Blair 03/23/1941, MRN [id number]  PCP:  Pamela Infante, MD  Cardiologist:  Pamela Casino, MD  Electrophysiologist:  None   Referring MD: Pamela Infante, MD   CC: DOE  History of Present Illness:    Pamela Blair is a 80 y.o. female with a hx of chest pain and an abnormal coronary CTA in September 2020.  Was ultimately decided to proceed with diagnostic catheterization.  This was done in June 2021 and showed moderate three-vessel disease.  Because of unexplained anemia at that time it was decided to initially treat her with medical therapy until this could be worked up.  The patient did well on medical therapy.  She had a anemia work-up in July 2021.  Apparently there was no clear source of anemia.  She was transfused and placed on iron therapy.  She is done well since then until recently.  The patient's in the office today as an add-on.  She is noted some increasing shortness of breath with exertion over the last couple weeks.  She has no rest pain or orthopnea.  She has no chest pain that sounds like angina.  On interviewing her today in the office she asked if we can just go in and open up these arteries that are blocked.  She really does not understand why that was not done at her catheterization in June 2021.  Past Medical History:  Diagnosis Date  . Blood transfusion without reported diagnosis   . Cervical spinal stenosis   . Cervical spondylosis   . Hyperlipidemia   . Osteoporosis     Past Surgical History:  Procedure Laterality Date  . ABDOMINAL HYSTERECTOMY     and BSO, dx- dysfunctional bleeding  . ANTERIOR CERVICAL DECOMP/DISCECTOMY FUSION  10/3-10/2010   Dr Pamela Blair , [place] , [place]   . CESAREAN SECTION     x 4  . COLONOSCOPY     Dr Pamela Blair  . ENTEROSCOPY N/A 01/05/2020   Procedure: ENTEROSCOPY;  Surgeon: Pamela Stabler, MD;  Location: [facility];  Service: Gastroenterology;  Laterality: N/A;  . HOT  HEMOSTASIS N/A 01/05/2020   Procedure: HOT HEMOSTASIS (ARGON PLASMA COAGULATION/BICAP);  Surgeon: Pamela Stabler, MD;  Location: [facility];  Service: Gastroenterology;  Laterality: N/A;  . 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 LEFT HEART CATH AND CORONARY ANGIOGRAPHY N/A 12/05/2019   Procedure: LEFT HEART CATH AND CORONARY ANGIOGRAPHY;  Surgeon: Pamela Sine, MD;  [facility];  Service: Cardiovascular;  Laterality: N/A;  . TOTAL KNEE ARTHROPLASTY      Current Medications: Current Meds  Medication Sig  . acetaminophen (TYLENOL) 500 MG tablet Take 500-1,000 mg by mouth every 6 (six) hours as needed for mild pain or moderate pain.   Marland Kitchen amLODipine (NORVASC) 2.5 MG tablet Take 1 tablet (2.5 mg total) by mouth daily.  Marland Kitchen aspirin EC 81 MG tablet Take 81 mg by mouth at bedtime.   Marland Kitchen atorvastatin (LIPITOR) 40 MG tablet Take 1 tablet (40 mg total) by mouth daily.  Marland Kitchen b complex vitamins tablet Take 1 tablet by mouth daily.  . bimatoprost (LUMIGAN) 0.01 % SOLN Place 1 drop into both eyes at bedtime.  Marland Kitchen buPROPion (WELLBUTRIN XL) 150 MG 24 hr tablet Take 150 mg by mouth every morning.  . Calcium Carbonate Antacid (CALCIUM CARBONATE PO) Take  1,000 mg by mouth daily.   Marland Kitchen CALCIUM CITRATE PO Take 1,000 mg by mouth daily.  . Cholecalciferol (VITAMIN D3) 2000 UNITS capsule Take 1 capsule (2,000 Units total) by mouth daily.  . Coenzyme Q10 (COQ10 PO) Take 20 mg by mouth daily at 12 noon.   . diclofenac Sodium (VOLTAREN) 1 % GEL Apply 2 g topically at bedtime as needed (Hip Pain).  . Evolocumab (REPATHA SURECLICK) 782 MG/ML SOAJ Inject 1 Dose into the skin every 14 (fourteen) days.  Marland Kitchen ezetimibe (ZETIA) 10 MG tablet Take 10 mg by mouth at bedtime.   . ferrous sulfate 325 (65 FE) MG tablet Take 325 mg by mouth daily with breakfast.  . Glycerin-Hypromellose-PEG 400 (DRY EYE RELIEF DROPS) 0.2-0.2-1 % SOLN Place 1 drop into both eyes daily as needed  (Dry eye).  . isosorbide mononitrate (IMDUR) 30 MG 24 hr tablet Take 1 tablet (30 mg total) by mouth daily.  Marland Kitchen MAGNESIUM PO Take 1 tablet by mouth daily.   . metoprolol succinate (TOPROL-XL) 25 MG 24 hr tablet TAKE 1 TABLET BY MOUTH EVERY DAY  . nitroGLYCERIN (NITROSTAT) 0.4 MG SL tablet Place 1 tablet (0.4 mg total) under the tongue every 5 (five) minutes as needed for chest pain.  Marland Kitchen omega-3 fish oil (MAXEPA) 1000 MG CAPS capsule Take 3,000 mg by mouth daily.   . pantoprazole (PROTONIX) 40 MG tablet Take 40 mg by mouth daily.  . TURMERIC PO Take 1 capsule by mouth daily at 12 noon.     Allergies:   Niacin, Clarithromycin, Neomycin-bacitracin-polymyxin  [bacitracin-neomycin-polymyxin], and Neomycin   Social History   Socioeconomic History  . Marital status: Widowed    Spouse name: Not on file  . Number of children: 4  . Years of education: Not on file  . Highest education level: Not on file  Occupational History  . Occupation: house wife  Tobacco Use  . Smoking status: Never Smoker  . Smokeless tobacco: Never Used  Vaping Use  . Vaping Use: Never used  Substance and Sexual Activity  . Alcohol use: No    Alcohol/week: 0.0 standard drinks  . Drug use: No  . Sexual activity: Not on file  Other Topics Concern  . Not on file  Social History Narrative  . Not on file   Social Determinants of Health   Financial Resource Strain: Not on file  Food Insecurity: Not on file  Transportation Needs: Not on file  Physical Activity: Not on file  Stress: Not on file  Social Connections: Not on file     Family History: The patient's family history includes Heart attack (age of onset: 37) in her father; Stroke (age of onset: 21) in her mother. There is no history of Diabetes, Cancer, Colon cancer, Colon polyps, Stomach cancer, or Esophageal cancer.  ROS:   Please see the history of present illness.     All other systems reviewed and are negative.  EKGs/Labs/Other Studies Reviewed:     The following studies were reviewed today: Cath June 2021  EKG:  EKG is ordered today.  The ekg ordered today demonstrates NSR, HR 74  Recent Labs: 11/30/2019: BUN 14; Creatinine, Ser 0.71; Potassium 4.8; Sodium 138 01/05/2020: Hemoglobin 12.5; Platelets 256  Recent Lipid Panel    Component Value Date/Time   CHOL 138 02/28/2020 1037   TRIG 78 02/28/2020 1037   HDL 43 02/28/2020 1037   CHOLHDL 3.2 02/28/2020 1037   CHOLHDL 4 10/17/2013 0934   VLDL 12.0 10/17/2013 0934  Dietrich 80 02/28/2020 1037   LDLDIRECT 158.8 11/11/2012 0935    Physical Exam:    VS:  BP 124/70   Pulse 74   Ht 4\' 11"  (1.499 m)   Wt 140 lb 12.8 oz (63.9 kg)   BMI 28.44 kg/m     Wt Readings from Last 3 Encounters:  07/30/20 140 lb 12.8 oz (63.9 kg)  05/08/20 140 lb (63.5 kg)  02/22/20 136 lb 6.4 oz (61.9 kg)     GEN:  Well nourished, well developed female in no acute distress HEENT: Normal NECK: No JVD; No carotid bruits LYMPHATICS: No lymphadenopathy CARDIAC: RRR, no murmurs, rubs, gallops RESPIRATORY:  Clear to auscultation without rales, wheezing or rhonchi  ABDOMEN: Soft, non-distended MUSCULOSKELETAL:  No edema; No deformity  SKIN: Warm and dry NEUROLOGIC:  Alert and oriented x 3 PSYCHIATRIC:  Normal affect   ASSESSMENT:    DOE (dyspnea on exertion) Possible anginal equivalent- add Imdur, check CBC to r/o anemia.  I'll call her next week for follow up.   CAD (coronary artery disease) Moderate CAD at cath June 2021- medical rx recommended then secondary to anemia  Anemia GI work up by Dr Loletha Carrow- no clear source- treated with transfusion and iron Rx.  Dyslipidemia, goal LDL below 70 LDL 80 Aug 2021- Dr Debara Pickett follows  PLAN:    Add Imdur- check CBC and BMP.  I'll f/u with her next week. If she still c/o DOE and she is not significantly anemic we may need to proceed to cath.   Medication Adjustments/Labs and Tests Ordered: Current medicines are reviewed at length with the patient  today.  Concerns regarding medicines are outlined above.  Orders Placed This Encounter  Procedures  . CBC  . Basic Metabolic Panel (BMET)  . EKG 12-Lead   Meds ordered this encounter  Medications  . isosorbide mononitrate (IMDUR) 30 MG 24 hr tablet    Sig: Take 1 tablet (30 mg total) by mouth daily.    Dispense:  30 tablet    Refill:  6    Patient Instructions  Medication Instructions:  START- Isosorbide 30 mg by mouth daily  *If you need a refill on your cardiac medications before your next appointment, please call your pharmacy*   Lab Work: CBC and BMP  If you have labs (blood work) drawn today and your tests are completely normal, you will receive your results only by: Marland Kitchen MyChart Message (if you have MyChart) OR . A paper copy in the mail If you have any lab test that is abnormal or we need to change your treatment, we will call you to review the results.   Testing/Procedures: None Ordered   Follow-Up: At [facility], you and your health needs are our priority.  As part of our continuing mission to provide you with exceptional heart care, we have created designated Provider Care Teams.  These Care Teams include your primary Cardiologist (physician) and Advanced Practice Providers (APPs -  Physician Assistants and Nurse Practitioners) who all work together to provide you with the care you need, when you need it.  We recommend signing up for the patient portal called "MyChart".  Sign up information is provided on this After Visit Summary.  MyChart is used to connect with patients for Virtual Visits (Telemedicine).  Patients are able to view lab/test results, encounter notes, upcoming appointments, etc.  Non-urgent messages can be sent to your provider as well.   To learn more about what you can do with MyChart, go  to [email or web address].    Your next appointment:   Thursday February 3rd @ 9:15 am  The format for your next appointment:   Virtual Visit   Provider:    You will see one of the following Advanced Practice Providers on your designated Care Team:    Kerin Ransom, PA-C            Signed, Kerin Ransom, [place]  07/30/2020 3:57 PM    [place]

## 2020-07-30 NOTE — Assessment & Plan Note (Signed)
Possible anginal equivalent- add Imdur, check CBC to r/o anemia.  I'll call her next week for follow up.

## 2020-07-30 NOTE — Assessment & Plan Note (Signed)
LDL 80 Aug 2021- Dr Debara Pickett follows

## 2020-07-31 LAB — BASIC METABOLIC PANEL
BUN/Creatinine Ratio: 18 (ref 12–28)
BUN: 15 mg/dL (ref 8–27)
CO2: 22 mmol/L (ref 20–29)
Calcium: 9.1 mg/dL (ref 8.7–10.3)
Chloride: 102 mmol/L (ref 96–106)
Creatinine, Ser: 0.85 mg/dL (ref 0.57–1.00)
GFR calc Af Amer: 76 mL/min/{1.73_m2} (ref >59)
GFR calc non Af Amer: 66 mL/min/{1.73_m2} (ref >59)
Glucose: 105 mg/dL — ABNORMAL HIGH (ref 65–99)
Potassium: 4.7 mmol/L (ref 3.5–5.2)
Sodium: 140 mmol/L (ref 134–144)

## 2020-07-31 LAB — CBC
Hematocrit: 34.9 % (ref 34.0–46.6)
Hemoglobin: 11.6 g/dL (ref 11.1–15.9)
MCH: 29.4 pg (ref 26.6–33.0)
MCHC: 33.2 g/dL (ref 31.5–35.7)
MCV: 89 fL (ref 79–97)
Platelets: 280 10*3/uL (ref 150–450)
RBC: 3.94 x10E6/uL (ref 3.77–5.28)
RDW: 13.3 % (ref 11.7–15.4)
WBC: 7.6 10*3/uL (ref 3.4–10.8)

## 2020-08-02 ENCOUNTER — Telehealth: Payer: Self-pay | Admitting: Cardiology

## 2020-08-02 NOTE — Telephone Encounter (Signed)
Attempted to call the patient. No answer & no voice mail set up.  Will call back at a later time.  

## 2020-08-02 NOTE — Telephone Encounter (Signed)
New Message:     Pt would like her lab results from this week please.

## 2020-08-05 ENCOUNTER — Ambulatory Visit
Admission: RE | Admit: 2020-08-05 | Discharge: 2020-08-05 | Disposition: A | Discharge status: Home / Self Care | Payer: Medicare Other | Source: Ambulatory Visit | Attending: Internal Medicine | Admitting: Internal Medicine

## 2020-08-05 ENCOUNTER — Other Ambulatory Visit: Payer: Self-pay

## 2020-08-05 DIAGNOSIS — Z1231 Encounter for screening mammogram for malignant neoplasm of breast: Secondary | ICD-10-CM

## 2020-08-05 NOTE — Telephone Encounter (Signed)
Labs have been released to mychart

## 2020-08-06 ENCOUNTER — Telehealth: Payer: Self-pay | Admitting: Cardiology

## 2020-08-06 NOTE — Telephone Encounter (Signed)
Spoke with pt, aware the headaches are common for the isosorbide and that the longer she takes the medication the headaches should get better. She will take tylenol for the headaches. Questions regarding recent lab work answered. She will call if the headaches do not improve by the end of the week.

## 2020-08-06 NOTE — Telephone Encounter (Signed)
Pt c/o medication issue:  1. Name of Medication: isosorbide mononitrate (IMDUR) 30 MG 24 hr tablet  2. How are you currently taking this medication (dosage and times per day)? As prescribed  3. Are you having a reaction (difficulty breathing--STAT)? Yes  4. What is your medication issue? Pamela Blair is calling stating this medication is causing her headaches every time she takes it. She states she started it Sunday 08/04/20 and has had a headache everyday after taking it since. She is also requesting her recent lab results be discussed when calling back. Please advise.

## 2020-08-08 ENCOUNTER — Telehealth (INDEPENDENT_AMBULATORY_CARE_PROVIDER_SITE_OTHER): Payer: Medicare Other | Admitting: Cardiology

## 2020-08-08 ENCOUNTER — Encounter: Payer: Self-pay | Admitting: Cardiology

## 2020-08-08 VITALS — BP 126/76 | HR 73 | Ht 59.0 in | Wt 140.0 lb

## 2020-08-08 DIAGNOSIS — E785 Hyperlipidemia, unspecified: Secondary | ICD-10-CM | POA: Diagnosis not present

## 2020-08-08 DIAGNOSIS — I25119 Atherosclerotic heart disease of native coronary artery with unspecified angina pectoris: Secondary | ICD-10-CM

## 2020-08-08 DIAGNOSIS — D649 Anemia, unspecified: Secondary | ICD-10-CM | POA: Diagnosis not present

## 2020-08-08 DIAGNOSIS — I208 Other forms of angina pectoris: Secondary | ICD-10-CM | POA: Diagnosis not present

## 2020-08-08 DIAGNOSIS — R0600 Dyspnea, unspecified: Secondary | ICD-10-CM | POA: Diagnosis not present

## 2020-08-08 DIAGNOSIS — R0609 Other forms of dyspnea: Secondary | ICD-10-CM

## 2020-08-08 NOTE — Patient Instructions (Addendum)
Medication Instructions:  No Changes *If you need a refill on your cardiac medications before your next appointment, please call your pharmacy*   Lab Work: No Labs If you have labs (blood work) drawn today and your tests are completely normal, you will receive your results only by: Marland Kitchen MyChart Message (if you have MyChart) OR . A paper copy in the mail If you have any lab test that is abnormal or we need to change your treatment, we will call you to review the results.   Testing/Procedures: No Testing    Follow-Up: At [facility], you and your health needs are our priority.  As part of our continuing mission to provide you with exceptional heart care, we have created designated Provider Care Teams.  These Care Teams include your primary Cardiologist (physician) and Advanced Practice Providers (APPs -  Physician Assistants and Nurse Practitioners) who all work together to provide you with the care you need, when you need it.      Your next appointment:   September 30, 2020 1:45 PM  The format for your next appointment:   In Person  Provider:   Raliegh Ip Mali Hilty, MD

## 2020-08-08 NOTE — Progress Notes (Signed)
Virtual Visit via Telephone Note   This visit type was conducted due to national recommendations for restrictions regarding the COVID-19 Pandemic (e.g. social distancing) in an effort to limit this patient's exposure and mitigate transmission in our community.  Due to her co-morbid illnesses, this patient is at least at moderate risk for complications without adequate follow up.  This format is felt to be most appropriate for this patient at this time.  The patient did not have access to video technology/had technical difficulties with video requiring transitioning to audio format only (telephone).  All issues noted in this document were discussed and addressed.  No physical exam could be performed with this format.  Please refer to the patient's chart for her  consent to telehealth for [facility].    Date:  08/08/2020   ID:  Blair, Pamela 08/25/1940, MRN [id number] The patient was identified using 2 identifiers.  Patient Location: Home Provider Location: Home Office  PCP:  Crist Infante, MD  Cardiologist:  Pixie Casino, MD  Electrophysiologist:  None   Evaluation Performed:  Follow-Up Visit  Chief Complaint:  none  History of Present Illness:    Pamela Blair is a 80 y.o. female with a  hx of chest pain and an abnormal coronary CTA in September 2020.  It was ultimately decided to proceed with diagnostic catheterization.  This was done in June 2021 and showed moderate three-vessel disease.  Because of unexplained anemia at that time it was decided to initially treat her with medical therapy until this could be worked up.  The patient did well on medical therapy.  She had an anemia work-up in July 2021.  Apparently there was no clear source of anemia.  She was transfused and placed on iron therapy.    She was seen in the office 07/30/2020 with complaints of some DOE and atypical Lt lateral chest pain.  She was concerned because she knows she had blockages that were not addressed  secondary to her anemia which is now under control.  At that visit I suggested we add Imdur 30 mg and she was contacted today for follow-up.  Her son was also went on the call, he is a physician.  The patient tells me she is actually been doing well on the medication.  She is not having chest pressure or chest tightness.  She is tolerating the Imdur, she did have a couple days of headache but this seems to have resolved.  The patient does not have symptoms concerning for COVID-19 infection (fever, chills, cough, or new shortness of breath).    Past Medical History:  Diagnosis Date  . Blood transfusion without reported diagnosis   . Cervical spinal stenosis   . Cervical spondylosis   . Hyperlipidemia   . Osteoporosis    Past Surgical History:  Procedure Laterality Date  . ABDOMINAL HYSTERECTOMY     and BSO, dx- dysfunctional bleeding  . ANTERIOR CERVICAL DECOMP/DISCECTOMY FUSION  10/3-10/2010   Dr Pamala Hurry , [place] , [place]   . CESAREAN SECTION     x 4  . COLONOSCOPY     Dr Deatra Ina  . ENTEROSCOPY N/A 01/05/2020   Procedure: ENTEROSCOPY;  Surgeon: Doran Stabler, MD;  Location: [facility];  Service: Gastroenterology;  Laterality: N/A;  . HOT HEMOSTASIS N/A 01/05/2020   Procedure: HOT HEMOSTASIS (ARGON PLASMA COAGULATION/BICAP);  Surgeon: Doran Stabler, MD;  Location: [facility];  Service: Gastroenterology;  Laterality: N/A;  . 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 LEFT HEART CATH AND CORONARY ANGIOGRAPHY N/A 12/05/2019   Procedure: LEFT HEART CATH AND CORONARY ANGIOGRAPHY;  Surgeon: Troy Sine, MD;  Location: [facility];  Service: Cardiovascular;  Laterality: N/A;  . TOTAL KNEE ARTHROPLASTY       Current Meds  Medication Sig  . acetaminophen (TYLENOL) 500 MG tablet Take 500-1,000 mg by mouth every 6 (six) hours as needed for mild pain or moderate pain.   Marland Kitchen amLODipine (NORVASC) 2.5 MG tablet Take 1 tablet (2.5 mg total) by  mouth daily.  Marland Kitchen aspirin EC 81 MG tablet Take 81 mg by mouth at bedtime.   Marland Kitchen b complex vitamins tablet Take 1 tablet by mouth daily.  . bimatoprost (LUMIGAN) 0.01 % SOLN Place 1 drop into both eyes at bedtime.  Marland Kitchen buPROPion (WELLBUTRIN XL) 150 MG 24 hr tablet Take 150 mg by mouth every morning.  . Calcium Carbonate Antacid (CALCIUM CARBONATE PO) Take 1,000 mg by mouth daily.   Marland Kitchen CALCIUM CITRATE PO Take 1,000 mg by mouth daily.  . Cholecalciferol (VITAMIN D3) 2000 UNITS capsule Take 1 capsule (2,000 Units total) by mouth daily.  . Coenzyme Q10 (COQ10 PO) Take 20 mg by mouth daily at 12 noon.   . diclofenac Sodium (VOLTAREN) 1 % GEL Apply 2 g topically at bedtime as needed (Hip Pain).  . Evolocumab (REPATHA SURECLICK) 025 MG/ML SOAJ Inject 1 Dose into the skin every 14 (fourteen) days.  Marland Kitchen ezetimibe (ZETIA) 10 MG tablet Take 10 mg by mouth at bedtime.   . ferrous sulfate 325 (65 FE) MG tablet Take 325 mg by mouth daily with breakfast.  . Glycerin-Hypromellose-PEG 400 (DRY EYE RELIEF DROPS) 0.2-0.2-1 % SOLN Place 1 drop into both eyes daily as needed (Dry eye).  . isosorbide mononitrate (IMDUR) 30 MG 24 hr tablet Take 1 tablet (30 mg total) by mouth daily.  Marland Kitchen MAGNESIUM PO Take 1 tablet by mouth daily.   . metoprolol succinate (TOPROL-XL) 25 MG 24 hr tablet TAKE 1 TABLET BY MOUTH EVERY DAY  . omega-3 fish oil (MAXEPA) 1000 MG CAPS capsule Take 3,000 mg by mouth daily.   . pantoprazole (PROTONIX) 40 MG tablet Take 40 mg by mouth daily.  . TURMERIC PO Take 1 capsule by mouth daily at 12 noon.     Allergies:   Niacin, Clarithromycin, Neomycin-bacitracin-polymyxin  [bacitracin-neomycin-polymyxin], and Neomycin   Social History   Tobacco Use  . Smoking status: Never Smoker  . Smokeless tobacco: Never Used  Vaping Use  . Vaping Use: Never used  Substance Use Topics  . Alcohol use: No    Alcohol/week: 0.0 standard drinks  . Drug use: No     Family Hx: The patient's family history includes  Heart attack (age of onset: 55) in her father; Stroke (age of onset: 56) in her mother. There is no history of Diabetes, Cancer, Colon cancer, Colon polyps, Stomach cancer, or Esophageal cancer.  ROS:   Please see the history of present illness.    All other systems reviewed and are negative.   Prior CV studies:   The following studies were reviewed today:  Cath 12/04/2020-  Ost Cx to Prox Cx lesion is 80% stenosed.  2nd Mrg lesion is 60% stenosed.  1st Diag lesion is 70% stenosed.  Prox LAD to Mid LAD lesion is 60% stenosed.  Mid LAD lesion is 75% stenosed.  Dist LAD lesion is 60%  stenosed.  Ost RCA lesion is 20% stenosed.  Prox RCA lesion is 20% stenosed.  RPDA lesion is 50% stenosed.  RPAV-1 lesion is 40% stenosed.  RPAV-2 lesion is 40% stenosed.  The left ventricular systolic function is normal.  LV end diastolic pressure is normal.   Multivessel coronary artery disease with coronary calcification.  The LAD gives rise to a proximal diagonal vessel that has 75% ostial stenosis.  The LAD has 60% stenosis between the first diagonal and septal perforating artery followed by diffuse area of 70 to 80% stenosis in the mid vessel followed by 60% mid stenosis.  The left circumflex vessel has a very sharp 90 degree angle proximally and in the segment there is an 80% focal stenosis.  There is diffuse 60% stenosis in a large OM1 vessel.  He has mild 20% narrowing followed by 20% mid stenosis.  There is 50% ostial PDA stenosis with 40% stenosis in the distal RCA followed by an additional 40% more distal stenosis.  Preserved global LV contractility with EF estimation at least 55%.  LVEDP 14 mmHg.  There is extensive mitral annular calcification.  RECOMMENDATION: I had a long discussion with the patient and reviewed the catheterization findings.  Since she is not on any anti-ischemic medications she will be discharged today with initiation of metoprolol succinate as well as  amlodipine.  She will need follow-up hemoglobin to reassess her progressive anemia with probable need for endoscopy. Titrate medications as blood pressure and heart rate allow.  May need future intervention.   Labs/Other Tests and Data Reviewed:    EKG:  An ECG dated 07/30/2020 was personally reviewed today and demonstrated:  NSR, 78  Recent Labs: 07/30/2020: BUN 15; Creatinine, Ser 0.85; Hemoglobin 11.6; Platelets 280; Potassium 4.7; Sodium 140   Recent Lipid Panel Lab Results  Component Value Date/Time   CHOL 138 02/28/2020 10:37 AM   TRIG 78 02/28/2020 10:37 AM   HDL 43 02/28/2020 10:37 AM   CHOLHDL 3.2 02/28/2020 10:37 AM   CHOLHDL 4 10/17/2013 09:34 AM   LDLCALC 80 02/28/2020 10:37 AM   LDLDIRECT 158.8 11/11/2012 09:35 AM    Wt Readings from Last 3 Encounters:  08/08/20 140 lb (63.5 kg)  07/30/20 140 lb 12.8 oz (63.9 kg)  05/08/20 140 lb (63.5 kg)     Risk Assessment/Calculations:      Objective:    Vital Signs:  BP 126/76   Pulse 73   Ht 4\' 11"  (1.499 m)   Wt 140 lb (63.5 kg)   SpO2 98%   BMI 28.28 kg/m    VITAL SIGNS:  reviewed  ASSESSMENT & PLAN:    DOE (dyspnea on exertion) Possible anginal equivalent- add Imdur, check CBC to r/o anemia.  I'll call her next week for follow up.  CAD (coronary artery disease) Moderate CAD at cath June 2021- medical rx recommended then secondary to anemia  Anemia GI work up by Dr Loletha Carrow- no clear source- treated with transfusion and iron Rx.  Dyslipidemia, goal LDL below 70 LDL 80 Aug 2021- Dr Debara Pickett follows  Plan: Same Rx- f/u with Dr Debara Pickett.  If she has increased symptoms she'll probably need restudy.     COVID-19 Education: The signs and symptoms of COVID-19 were discussed with the patient and how to seek care for testing (follow up with PCP or arrange E-visit).  The importance of social distancing was discussed today.  Time:   Today, I have spent 20 minutes with the patient with telehealth technology discussing  the above problems.     Medication Adjustments/Labs and Tests Ordered: Current medicines are reviewed at length with the patient today.  Concerns regarding medicines are outlined above.   Tests Ordered: No orders of the defined types were placed in this encounter.   Medication Changes: No orders of the defined types were placed in this encounter.   Follow Up:  In Person with Dr Debara Pickett  Signed, Kerin Ransom, PA-C  08/08/2020 9:08 AM    [place]

## 2020-08-20 DIAGNOSIS — I2510 Atherosclerotic heart disease of native coronary artery without angina pectoris: Secondary | ICD-10-CM | POA: Diagnosis not present

## 2020-08-20 DIAGNOSIS — D509 Iron deficiency anemia, unspecified: Secondary | ICD-10-CM | POA: Diagnosis not present

## 2020-08-20 DIAGNOSIS — E785 Hyperlipidemia, unspecified: Secondary | ICD-10-CM | POA: Diagnosis not present

## 2020-08-20 DIAGNOSIS — M25511 Pain in right shoulder: Secondary | ICD-10-CM | POA: Diagnosis not present

## 2020-08-20 DIAGNOSIS — I700 Atherosclerosis of aorta: Secondary | ICD-10-CM | POA: Diagnosis not present

## 2020-08-20 DIAGNOSIS — F329 Major depressive disorder, single episode, unspecified: Secondary | ICD-10-CM | POA: Diagnosis not present

## 2020-08-20 DIAGNOSIS — M810 Age-related osteoporosis without current pathological fracture: Secondary | ICD-10-CM | POA: Diagnosis not present

## 2020-08-20 DIAGNOSIS — I10 Essential (primary) hypertension: Secondary | ICD-10-CM | POA: Diagnosis not present

## 2020-08-20 DIAGNOSIS — M1990 Unspecified osteoarthritis, unspecified site: Secondary | ICD-10-CM | POA: Diagnosis not present

## 2020-08-20 DIAGNOSIS — D649 Anemia, unspecified: Secondary | ICD-10-CM | POA: Diagnosis not present

## 2020-09-02 ENCOUNTER — Other Ambulatory Visit (HOSPITAL_COMMUNITY): Payer: Self-pay | Admitting: *Deleted

## 2020-09-03 ENCOUNTER — Other Ambulatory Visit: Payer: Self-pay

## 2020-09-03 ENCOUNTER — Encounter (HOSPITAL_COMMUNITY)
Admission: RE | Admit: 2020-09-03 | Discharge: 2020-09-03 | Disposition: A | Discharge status: Home / Self Care | Payer: Medicare Other | Source: Ambulatory Visit | Attending: Internal Medicine | Admitting: Internal Medicine

## 2020-09-03 DIAGNOSIS — D649 Anemia, unspecified: Secondary | ICD-10-CM | POA: Insufficient documentation

## 2020-09-03 MED ORDER — FERUMOXYTOL INJECTION 510 MG/17 ML
510.0000 mg | INTRAVENOUS | Status: DC
  Administered 2020-09-03: 510 mg via INTRAVENOUS
  Filled 2020-09-03: qty 510

## 2020-09-03 NOTE — Discharge Instructions (Signed)
Ferumoxytol injection What is this medicine? FERUMOXYTOL is an iron complex. Iron is used to make healthy red blood cells, which carry oxygen and nutrients throughout the body. This medicine is used to treat iron deficiency anemia. This medicine may be used for other purposes; ask your health care provider or pharmacist if you have questions. COMMON BRAND NAME(S): Feraheme What should I tell my health care provider before I take this medicine? They need to know if you have any of these conditions:  anemia not caused by low iron levels  high levels of iron in the blood  magnetic resonance imaging (MRI) test scheduled  an unusual or allergic reaction to iron, other medicines, foods, dyes, or preservatives  pregnant or trying to get pregnant  breast-feeding How should I use this medicine? This medicine is for injection into a vein. It is given by a health care professional in a hospital or clinic setting. Talk to your pediatrician regarding the use of this medicine in children. Special care may be needed. Overdosage: If you think you have taken too much of this medicine contact a poison control center or emergency room at once. NOTE: This medicine is only for you. Do not share this medicine with others. What if I miss a dose? It is important not to miss your dose. Call your doctor or health care professional if you are unable to keep an appointment. What may interact with this medicine? This medicine may interact with the following medications:  other iron products This list may not describe all possible interactions. Give your health care provider a list of all the medicines, herbs, non-prescription drugs, or dietary supplements you use. Also tell them if you smoke, drink alcohol, or use illegal drugs. Some items may interact with your medicine. What should I watch for while using this medicine? Visit your doctor or healthcare professional regularly. Tell your doctor or healthcare  professional if your symptoms do not start to get better or if they get worse. You may need blood work done while you are taking this medicine. You may need to follow a special diet. Talk to your doctor. Foods that contain iron include: whole grains/cereals, dried fruits, beans, or peas, leafy green vegetables, and organ meats (liver, kidney). What side effects may I notice from receiving this medicine? Side effects that you should report to your doctor or health care professional as soon as possible:  allergic reactions like skin rash, itching or hives, swelling of the face, lips, or tongue  breathing problems  changes in blood pressure  feeling faint or lightheaded, falls  fever or chills  flushing, sweating, or hot feelings  swelling of the ankles or feet Side effects that usually do not require medical attention (report to your doctor or health care professional if they continue or are bothersome):  diarrhea  headache  nausea, vomiting  stomach pain This list may not describe all possible side effects. Call your doctor for medical advice about side effects. You may report side effects to FDA at 1-800-FDA-1088. Where should I keep my medicine? This drug is given in a hospital or clinic and will not be stored at home. NOTE: This sheet is a summary. It may not cover all possible information. If you have questions about this medicine, talk to your doctor, pharmacist, or health care provider.  2021 Elsevier/Gold Standard (2016-08-10 20:21:10)  

## 2020-09-10 ENCOUNTER — Encounter (HOSPITAL_COMMUNITY)
Admission: RE | Admit: 2020-09-10 | Discharge: 2020-09-10 | Disposition: A | Discharge status: Home / Self Care | Payer: Medicare Other | Source: Ambulatory Visit | Attending: Internal Medicine | Admitting: Internal Medicine

## 2020-09-10 ENCOUNTER — Other Ambulatory Visit: Payer: Self-pay

## 2020-09-10 DIAGNOSIS — D649 Anemia, unspecified: Secondary | ICD-10-CM | POA: Diagnosis not present

## 2020-09-10 MED ORDER — SODIUM CHLORIDE 0.9 % IV SOLN
510.0000 mg | INTRAVENOUS | Status: DC
  Administered 2020-09-10: 510 mg via INTRAVENOUS
  Filled 2020-09-10: qty 510

## 2020-09-30 ENCOUNTER — Encounter: Payer: Self-pay | Admitting: Internal Medicine

## 2020-09-30 ENCOUNTER — Other Ambulatory Visit: Payer: Self-pay

## 2020-09-30 ENCOUNTER — Ambulatory Visit (INDEPENDENT_AMBULATORY_CARE_PROVIDER_SITE_OTHER): Payer: Medicare Other | Admitting: Internal Medicine

## 2020-09-30 VITALS — BP 132/68 | HR 63 | Ht 60.0 in | Wt 142.4 lb

## 2020-09-30 DIAGNOSIS — I2510 Atherosclerotic heart disease of native coronary artery without angina pectoris: Secondary | ICD-10-CM | POA: Diagnosis not present

## 2020-09-30 DIAGNOSIS — D509 Iron deficiency anemia, unspecified: Secondary | ICD-10-CM

## 2020-09-30 DIAGNOSIS — I208 Other forms of angina pectoris: Secondary | ICD-10-CM

## 2020-09-30 DIAGNOSIS — E785 Hyperlipidemia, unspecified: Secondary | ICD-10-CM

## 2020-09-30 NOTE — Patient Instructions (Signed)
Medication Instructions:  Your physician recommends that you continue on your current medications as directed. Please refer to the Current Medication list given to you today.  *If you need a refill on your cardiac medications before your next appointment, please call your pharmacy*  Follow-Up: At [facility], you and your health needs are our priority.  As part of our continuing mission to provide you with exceptional heart care, we have created designated Provider Care Teams.  These Care Teams include your primary Cardiologist (physician) and Advanced Practice Providers (APPs -  Physician Assistants and Nurse Practitioners) who all work together to provide you with the care you need, when you need it.  We recommend signing up for the patient portal called "MyChart".  Sign up information is provided on this After Visit Summary.  MyChart is used to connect with patients for Virtual Visits (Telemedicine).  Patients are able to view lab/test results, encounter notes, upcoming appointments, etc.  Non-urgent messages can be sent to your provider as well.   To learn more about what you can do with MyChart, go to [email or web address].    Your next appointment:   3-4 month(s)  The format for your next appointment:   In Person  Provider:   You may see Pixie Casino, MD or one of the following Advanced Practice Providers on your designated Care Team:    Almyra Deforest, PA-C  Fabian Sharp, PA-C or   Roby Lofts, [place]    Other Instructions

## 2020-10-01 NOTE — Progress Notes (Signed)
OFFICE NOTE  Chief Complaint:  Follow-up  Primary Care Physician: Crist Infante, MD  HPI:  Pamela Blair is a pleasant 80 year old female who was present followed by Dr. Linna Darner and currently is seeing Dr. Hardie Shackleton. She was referred for evaluation of chest pain per Dr. Huntley Estelle notes, however she is "not clear why she is here". She occasionally has some chest discomfort in fact she notes some pain in her left upper chest and left shoulder blade particular when moving and lifting things. She gets pain it goes down her left arm. She does have a history of neck surgery from an anterior approach. 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 She does have a prior spinal compression fracture. In addition she seemed to have aortic atherosclerosis on imaging. A family history is not significant for any coronary disease. She does have some Arctic risk factors including dyslipidemia and was recently started on pravastatin. She was also prescribed etodolac for arthritis, but she never got the prescription filled and is not taking it. Her chest pain is not necessarily worse with exertion or relieved by rest. She does report some increasing shortness of breath which is unusual for her.  10/24/2019  Pamela Blair returns today for follow-up.  I last saw her in 2016 and therefore she is considered a new patient.  In the past she had undergone some testing for presumed angina although she had no complaints at that time.  Today she is also without any significant symptoms.  She reported that she had some unusual symptoms that were concerning for possible heart disease.  She has been under a lot of stress as her husband apparently died over the holidays from COVID-19.  She says she was out of town and was able to come back with her son who was a Engineer, drilling.  They were living in [place].  She is describing more shortness of breath and fatigue and seems to be quite emotional at  this point.  She denies any pain in the chest.  She also feels some palpitations at times or symptoms of her heart racing.  02/22/2020  Pamela Blair returns today for follow-up from left heart catheterization.  She had been complaining of symptoms concerning for coronary disease and I recommended a CT coronary angiogram.  I personally interpreted this study which demonstrated least moderate mixed proximal LAD stenosis.  The calcium score was significantly elevated at 901, 92nd percentile for age and sex matched control.  Additionally, her FFR demonstrated significant flow limitation in the proximal to mid LAD as suspected and possibly the mid circumflex artery.  Subsequently she was referred for definitive cardiac catheterization on 12/05/2019.  This demonstrated multivessel coronary artery calcification with 75% proximal diagonal ostial stenosis, 60% proximal LAD stenosis and diffuse 7080% stenosis in the mid LAD.  There was an 80% focal stenosis in left circumflex and 60% diffuse stenosis in a large OM vessel.  LVEF was 55%.  Based on the cath findings and the fact that she was not on anti-ischemic therapy, she was placed on metoprolol and amlodipine and given her recent anemia, further work-up was recommended.  She did undergo significant GI work-up by Dr. Loletha Carrow due to heme positive stool, including colonoscopy in 11/2019, endoscopy and capsule endoscopy which showed one small area of erosion in the small bowel but no clear sources of her anemia.  She was subsequently transfused and provided iron which she remains on.  Anemia has  significantly improved from 8.7 hemoglobin 2 months ago to 12.5.,  Iron, TIBC and trans sat ratios are now normal.  In the office today she reports that she feels better.  She does not have any of the typical anginal symptoms she was describing previously.  EKG is normal sinus rhythm without ischemia today.  Blood pressure is noted to be low at 98/60 and more recently she has had some lower  blood pressures based on home readings.  In addition she has been complaining of bilateral leg pain and cramping which started after increasing her atorvastatin from 40 to 80 mg daily.  That being said her most recent LDL in September 2020 was 150.  She will likely need more definitive therapy to reach a target LDL less than 70.  05/08/2020  Pamela Blair returns today for follow-up with her son.  She reports no symptoms of chest pain or worsening shortness of breath.  Apparently her hemoglobin has improved somewhat and she had had transfusion and remains on iron.  Recent lipids showed improvement with total cholesterol 138, triglycerides 78, HDL 43 and LDL of 180.  She just started on Repatha, having taken her first dose this week.  Apparently her PCP had instructed them to only use Repatha once monthly because of the concern of a very low LDL cholesterol.  I also think that her LDL will be quite low however there is no study that I am aware of demonstrating the efficacy of once monthly PCSK9 inhibitors-in fact the pharmacokinetics suggest that during the month in between doses, the cholesterol is likely to rise back to the pretreatment levels.  Ultimately though, I think we could consider discontinuing her ezetimibe or reducing her statin dose.  09/30/2020  Pamela Blair returns today for follow-up.  She was recently seen by Kerin Ransom, PA-C for apnea on exertion which is her possible anginal equivalent.  This was a virtual visit.  He advised her to start on isosorbide.  She says today in the office, and was accompanied by her son, that she has had improvement in her symptoms and he has noticed that on the isosorbide.  This is suggestive of an anti-ischemic effect.  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  This has improved somewhat with recent hemoglobin of 11.6 and hematocrit of 34.9, about 2 months ago.  Blood pressure is good today.   She shows a sinus rhythm with nonspecific ST changes.  PMHx:  Past Medical History:  Diagnosis Date  . Blood transfusion without reported diagnosis   . Cervical spinal stenosis   . Cervical spondylosis   . Hyperlipidemia   . Osteoporosis     Past Surgical History:  Procedure Laterality Date  . ABDOMINAL HYSTERECTOMY     and BSO, dx- dysfunctional bleeding  . ANTERIOR CERVICAL DECOMP/DISCECTOMY FUSION  10/3-10/2010   Dr Pamala Hurry , [place] , [place]   . CESAREAN SECTION     x 4  . COLONOSCOPY     Dr Deatra Ina  . ENTEROSCOPY N/A 01/05/2020   Procedure: ENTEROSCOPY;  Surgeon: Doran Stabler, MD;  Location: [facility];  Service: Gastroenterology;  Laterality: N/A;  . HOT HEMOSTASIS N/A 01/05/2020   Procedure: HOT HEMOSTASIS (ARGON PLASMA COAGULATION/BICAP);  Surgeon: Doran Stabler, MD;  Location: [facility];  Service: Gastroenterology;  Laterality: N/A;  . 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 LEFT  HEART CATH AND CORONARY ANGIOGRAPHY N/A 12/05/2019   Procedure: LEFT HEART CATH AND CORONARY ANGIOGRAPHY;  Surgeon: Troy Sine, MD;  Location: [facility];  Service: Cardiovascular;  Laterality: N/A;  . TOTAL KNEE ARTHROPLASTY      FAMHx:  Family History  Problem Relation Age of Onset  . Stroke Mother 18  . Heart attack Father 50  . Diabetes Neg Hx   . Cancer Neg Hx   . Colon cancer Neg Hx   . Colon polyps Neg Hx   . Stomach cancer Neg Hx   . Esophageal cancer Neg Hx     SOCHx:   reports that she has never smoked. She has never used smokeless tobacco. She reports that she does not drink alcohol and does not use drugs.  ALLERGIES:  Allergies  Allergen Reactions  . Niacin     rash  . Clarithromycin Other (See Comments)    unknown reaction  . Neomycin-Bacitracin-Polymyxin  [Bacitracin-Neomycin-Polymyxin]   . Neomycin Rash    ROS: Pertinent items noted in HPI and remainder of comprehensive ROS otherwise  negative.  HOME MEDS: Current Outpatient Medications  Medication Sig Dispense Refill  . acetaminophen (TYLENOL) 500 MG tablet Take 500-1,000 mg by mouth every 6 (six) hours as needed for mild pain or moderate pain.     Marland Kitchen amLODipine (NORVASC) 2.5 MG tablet Take 1 tablet (2.5 mg total) by mouth daily. 90 tablet 3  . aspirin EC 81 MG tablet Take 81 mg by mouth at bedtime.     Marland Kitchen atorvastatin (LIPITOR) 40 MG tablet Take 1 tablet (40 mg total) by mouth daily. 90 tablet 3  . b complex vitamins tablet Take 1 tablet by mouth daily.    . bimatoprost (LUMIGAN) 0.01 % SOLN Place 1 drop into both eyes at bedtime.    Marland Kitchen buPROPion (WELLBUTRIN XL) 150 MG 24 hr tablet Take 150 mg by mouth every morning.    . Calcium Carbonate Antacid (CALCIUM CARBONATE PO) Take 1,000 mg by mouth daily.     Marland Kitchen CALCIUM CITRATE PO Take 1,000 mg by mouth daily.    . Cholecalciferol (VITAMIN D3) 2000 UNITS capsule Take 1 capsule (2,000 Units total) by mouth daily. 100 capsule 3  . Coenzyme Q10 (COQ10 PO) Take 20 mg by mouth daily at 12 noon.   0  . diclofenac Sodium (VOLTAREN) 1 % GEL Apply 2 g topically at bedtime as needed (Hip Pain).    . Evolocumab (REPATHA SURECLICK) 030 MG/ML SOAJ Inject 1 Dose into the skin every 14 (fourteen) days. 2.1 mL 11  . ezetimibe (ZETIA) 10 MG tablet Take 10 mg by mouth at bedtime.     . ferrous sulfate 325 (65 FE) MG tablet Take 325 mg by mouth daily with breakfast.    . Glycerin-Hypromellose-PEG 400 (DRY EYE RELIEF DROPS) 0.2-0.2-1 % SOLN Place 1 drop into both eyes daily as needed (Dry eye).    . isosorbide mononitrate (IMDUR) 30 MG 24 hr tablet Take 1 tablet (30 mg total) by mouth daily. 30 tablet 6  . MAGNESIUM PO Take 1 tablet by mouth daily.     . metoprolol succinate (TOPROL-XL) 25 MG 24 hr tablet TAKE 1 TABLET BY MOUTH EVERY DAY 90 tablet 3  . nitroGLYCERIN (NITROSTAT) 0.4 MG SL tablet Place 1 tablet (0.4 mg total) under the tongue every 5 (five) minutes as needed for chest pain. 25 tablet 3   . omega-3 fish oil (MAXEPA) 1000 MG CAPS capsule Take 3,000 mg by  mouth daily.     . TURMERIC PO Take 1 capsule by mouth daily at 12 noon.    . pantoprazole (PROTONIX) 40 MG tablet Take 40 mg by mouth daily.     No current facility-administered medications for this visit.    LABS/IMAGING: No results found for this or any previous visit (from the past 48 hour(s)). No results found.  WEIGHTS: Wt Readings from Last 3 Encounters:  09/30/20 142 lb 6.4 oz (64.6 kg)  08/08/20 140 lb (63.5 kg)  07/30/20 140 lb 12.8 oz (63.9 kg)    VITALS: BP 132/68 (BP Location: Left Arm, Patient Position: Sitting, Cuff Size: Normal)   Pulse 63   Ht 5' (1.524 m)   Wt 142 lb 6.4 oz (64.6 kg)   SpO2 99%   BMI 27.81 kg/m   EXAM: General appearance: alert and no distress Neck: no carotid bruit and no JVD Lungs: clear to auscultation bilaterally Heart: regular rate and rhythm, S1, S2 normal, no murmur, click, rub or gallop Abdomen: soft, non-tender; bowel sounds normal; no masses,  no organomegaly Extremities: Reproducible left upper chest and left upper scapular pain on palpation Pulses: 2+ and symmetric Skin: Skin color, texture, turgor normal. No rashes or lesions Neurologic: Grossly normal Psych: Pleasant  EKG: Normal sinus rhythm at 63, nonspecific ST changes-personally reviewed  ASSESSMENT: 1. 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 Progressive dyspnea on exertion, palpitations, fatigue - suspect due to CAD/anemia 3. Anemia - likely chronic, no clear GI blood loss source, improved with tranfusion/iron 4. Aortic atherosclerosis 5. Cervical and thoracic spine disease 6. 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   Mrs. Kreeger seems to be better optimized on her medical therapy for coronary disease.  After adding isosorbide, her shortness of breath has improved.  I suspect this was her anginal equivalent.  She had some anemia but  that is improved as well.  Now on Repatha I expect her cholesterol to be even lower.  Her last LDL was 79 in October.  We will repeat lipids.  I would continue to manage this medically unless she becomes refractory to the isosorbide.  Follow-up in 6 months or sooner as necessary  Pixie Casino, MD, [facility], [place] Director of the [facility]  [facility] Diplomate of the American Board of Clinical Lipidology Attending Cardiologist  Direct Dial: [phone]  Fax: [phone]  Website:  [email or web address] 10/01/2020, 4:44 PM

## 2020-10-09 DIAGNOSIS — J069 Acute upper respiratory infection, unspecified: Secondary | ICD-10-CM | POA: Diagnosis not present

## 2020-10-09 DIAGNOSIS — R509 Fever, unspecified: Secondary | ICD-10-CM | POA: Diagnosis not present

## 2020-10-10 DIAGNOSIS — Z20828 Contact with and (suspected) exposure to other viral communicable diseases: Secondary | ICD-10-CM | POA: Diagnosis not present

## 2020-10-10 DIAGNOSIS — R059 Cough, unspecified: Secondary | ICD-10-CM | POA: Diagnosis not present

## 2020-11-04 DIAGNOSIS — M79641 Pain in right hand: Secondary | ICD-10-CM | POA: Diagnosis not present

## 2020-11-18 DIAGNOSIS — I2510 Atherosclerotic heart disease of native coronary artery without angina pectoris: Secondary | ICD-10-CM | POA: Diagnosis not present

## 2020-11-18 DIAGNOSIS — E785 Hyperlipidemia, unspecified: Secondary | ICD-10-CM | POA: Diagnosis not present

## 2020-11-18 DIAGNOSIS — M75111 Incomplete rotator cuff tear or rupture of right shoulder, not specified as traumatic: Secondary | ICD-10-CM | POA: Diagnosis not present

## 2020-11-19 LAB — LIPID PANEL
Chol/HDL Ratio: 1.6 ratio (ref 0.0–4.4)
Cholesterol, Total: 93 mg/dL — ABNORMAL LOW (ref 100–199)
HDL: 58 mg/dL (ref >39)
LDL Chol Calc (NIH): 18 mg/dL (ref 0–99)
Triglycerides: 83 mg/dL (ref 0–149)
VLDL Cholesterol Cal: 17 mg/dL (ref 5–40)

## 2020-11-21 ENCOUNTER — Other Ambulatory Visit: Payer: Self-pay | Admitting: *Deleted

## 2020-11-21 DIAGNOSIS — E785 Hyperlipidemia, unspecified: Secondary | ICD-10-CM

## 2020-11-25 DIAGNOSIS — M25531 Pain in right wrist: Secondary | ICD-10-CM | POA: Diagnosis not present

## 2020-12-04 DIAGNOSIS — G5601 Carpal tunnel syndrome, right upper limb: Secondary | ICD-10-CM | POA: Diagnosis not present

## 2020-12-06 ENCOUNTER — Other Ambulatory Visit: Payer: Self-pay | Admitting: Medical

## 2020-12-06 NOTE — Telephone Encounter (Signed)
This is Dr. Hilty's pt 

## 2020-12-09 ENCOUNTER — Telehealth: Payer: Self-pay | Admitting: Internal Medicine

## 2020-12-09 NOTE — Telephone Encounter (Signed)
STAT if patient feels like he/she is going to faint   1) Are you dizzy now? No   2) Do you feel faint or have you passed out?  No   3) Do you have any other symptoms? Nausea, light headed  4) Have you checked your HR and BP (record if available)?    Pt c/o Shortness Of Breath: STAT if SOB developed within the last 24 hours or pt is noticeably SOB on the phone  1. Are you currently SOB (can you hear that pt is SOB on the phone)? yes  2. How long have you been experiencing SOB? 2 weeks  3. Are you SOB when sitting or when up moving around? When moving around  4. Are you currently experiencing any other symptoms? PT STATES YESTERDAY SHE FELT NAUSEUS AND DIZZY, ALWAYS SHORT OF BREATH IF SHE WALKS OR MOVES AROUND, WOULD LIKE APPT TO SEE WHAT IS CAUSING ISSUE, SHE IS TAKING RXS AND CHOLESTEROL IS GOING DOWN BECAUSE SHE IS TAKING HER SHOT, NOT SURE WHAT THE PROBLEM IS

## 2020-12-09 NOTE — Telephone Encounter (Signed)
Spoke to patient she stated for the past 2 weeks she has noticed she is sob.Sob worse with exertion.No chest pain.Stated yesterday she had a episode she became nauseated,headache.No nausea today.Appointment offered with DOD 6/8.Patient stated she cannot come would like a later appointment.Appointment scheduled with Coletta Memos NP 6/29 at 2:15 pm.Advised if sob becomes worse to go to ED.

## 2020-12-18 DIAGNOSIS — F329 Major depressive disorder, single episode, unspecified: Secondary | ICD-10-CM | POA: Diagnosis not present

## 2020-12-18 DIAGNOSIS — E785 Hyperlipidemia, unspecified: Secondary | ICD-10-CM | POA: Diagnosis not present

## 2020-12-18 DIAGNOSIS — D320 Benign neoplasm of cerebral meninges: Secondary | ICD-10-CM | POA: Diagnosis not present

## 2020-12-18 DIAGNOSIS — I2510 Atherosclerotic heart disease of native coronary artery without angina pectoris: Secondary | ICD-10-CM | POA: Diagnosis not present

## 2020-12-18 DIAGNOSIS — H8110 Benign paroxysmal vertigo, unspecified ear: Secondary | ICD-10-CM | POA: Diagnosis not present

## 2020-12-18 DIAGNOSIS — M1990 Unspecified osteoarthritis, unspecified site: Secondary | ICD-10-CM | POA: Diagnosis not present

## 2020-12-18 DIAGNOSIS — I700 Atherosclerosis of aorta: Secondary | ICD-10-CM | POA: Diagnosis not present

## 2020-12-18 DIAGNOSIS — R0600 Dyspnea, unspecified: Secondary | ICD-10-CM | POA: Diagnosis not present

## 2020-12-18 DIAGNOSIS — R030 Elevated blood-pressure reading, without diagnosis of hypertension: Secondary | ICD-10-CM | POA: Diagnosis not present

## 2020-12-18 DIAGNOSIS — I6529 Occlusion and stenosis of unspecified carotid artery: Secondary | ICD-10-CM | POA: Diagnosis not present

## 2020-12-18 DIAGNOSIS — M810 Age-related osteoporosis without current pathological fracture: Secondary | ICD-10-CM | POA: Diagnosis not present

## 2020-12-18 DIAGNOSIS — D509 Iron deficiency anemia, unspecified: Secondary | ICD-10-CM | POA: Diagnosis not present

## 2020-12-25 ENCOUNTER — Ambulatory Visit (INDEPENDENT_AMBULATORY_CARE_PROVIDER_SITE_OTHER): Payer: Medicare Other | Admitting: Podiatry

## 2020-12-25 ENCOUNTER — Encounter: Payer: Self-pay | Admitting: Podiatry

## 2020-12-25 ENCOUNTER — Other Ambulatory Visit: Payer: Self-pay

## 2020-12-25 DIAGNOSIS — L84 Corns and callosities: Secondary | ICD-10-CM

## 2020-12-25 DIAGNOSIS — I2510 Atherosclerotic heart disease of native coronary artery without angina pectoris: Secondary | ICD-10-CM | POA: Diagnosis not present

## 2020-12-25 DIAGNOSIS — M2041 Other hammer toe(s) (acquired), right foot: Secondary | ICD-10-CM | POA: Diagnosis not present

## 2020-12-25 DIAGNOSIS — M216X9 Other acquired deformities of unspecified foot: Secondary | ICD-10-CM | POA: Diagnosis not present

## 2020-12-26 NOTE — Progress Notes (Signed)
Subjective:   Patient ID: Pamela Blair, female   DOB: 80 y.o.   MRN: [id number]   HPI Patient presents concerned about chronic pain in her right plantar foot long-term history of bunion correction lesion formation and feels like she is walking on her bone.  No change health history   ROS      Objective:  Physical Exam  Patient is found to have structural abnormality with metatarsal pressure lesion formation and pain with walking.  Patient has moderate structural deformity of the big toe with range of motion loss but good alignment with incision site that is healed well     Assessment:  Structural changes with prominent metatarsal and lesions secondary to structure and walking pattern with digital deformities bunion deformity which is done well     Plan:  H&P reviewed condition debrided lesion recommended padding therapy anti-inflammatories topical and discussed the possibility for surgical elevating osteotomy at 1 point in future.  Spent a great deal of time educating her on these different treatments and the fact it may require more aggressive treatment plan in the future

## 2020-12-30 NOTE — Progress Notes (Signed)
Cardiology Clinic Note   Patient Name: Pamela Blair Date of Encounter: 01/01/2021  Primary Care Provider:  Crist Infante, MD Primary Cardiologist:  Pixie Casino, MD  Patient Profile    Pamela Blair 80 year old female presents the clinic today for  evaluation of her dyspnea on exertion.  Past Medical History    Past Medical History:  Diagnosis Date   Blood transfusion without reported diagnosis    Cervical spinal stenosis    Cervical spondylosis    Hyperlipidemia    Osteoporosis    Past Surgical History:  Procedure Laterality Date   ABDOMINAL HYSTERECTOMY     and BSO, dx- dysfunctional bleeding   ANTERIOR CERVICAL DECOMP/DISCECTOMY FUSION  10/3-10/2010   Dr Pamala Hurry , [place] , [facility]    CESAREAN SECTION     x 4   COLONOSCOPY     Dr Deatra Ina   ENTEROSCOPY N/A 01/05/2020   Procedure: ENTEROSCOPY;  Surgeon: Doran Stabler, MD;  Location: [facility];  Service: Gastroenterology;  Laterality: N/A;   HOT HEMOSTASIS N/A 01/05/2020   Procedure: HOT HEMOSTASIS (ARGON PLASMA COAGULATION/BICAP);  Surgeon: Doran Stabler, MD;  Location: [facility];  Service: Gastroenterology;  Laterality: N/A;   LAPAROSCOPIC SMALL BOWEL RESECTION     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, in the 90s   LEFT HEART CATH AND CORONARY ANGIOGRAPHY N/A 12/05/2019   Procedure: LEFT HEART CATH AND CORONARY ANGIOGRAPHY;  Surgeon: Troy Sine, MD;  Location: [facility];  Service: Cardiovascular;  Laterality: N/A;   TOTAL KNEE ARTHROPLASTY      Allergies  Allergies  Allergen Reactions   Niacin     rash   Clarithromycin Other (See Comments)    unknown reaction   Neomycin-Bacitracin-Polymyxin  [Bacitracin-Neomycin-Polymyxin]    Neomycin Rash    History of Present Illness    Pamela Blair has a PMH of coronary artery disease lactose intolerance, carpal tunnel syndrome, osteoporosis, chondromalacia, vitamin D deficiency, depression, polymyalgia rheumatica, fatigue, dyspnea on exertion, chest  wall contusion, and anemia.  Pamela Blair underwent coronary CT angiogram which showed moderate mixed proximal LAD stenosis.  Her calcium score was 901 placed her in the 92nd percentile for age and sex matched control.  Her FFR showed significant flow limitation in the proximal to mid LAD and possibly the mid circumflex artery.  Pamela Blair underwent cardiac catheterization 12/05/2019.  It showed multivessel coronary artery calcification with 75% proximal diagonal ostial stenosis, 60% proximal LAD stenosis, and diffuse 70-80% stenosis in the mid LAD.  There was also 80% focal stenosis in her left circumflex and 60% diffuse stenosis in her large 1 vessel.  Her LVEF was 55%.  Due to the fact that Pamela Blair was not a candidate for anti-ischemic therapy Pamela Blair was placed on metoprolol and amlodipine and given her recent anemia further work-up was recommended.  Pamela Blair underwent significant GI work-up by Dr. Loletha Carrow.  Pamela Blair was found to be in a stool positive and underwent colonoscopy, endoscopy, and capsule endoscopy which showed small area of erosion of her small bowel but was not a clear source of anemia.  Pamela Blair was subsequently transfused and started on iron supplementation.  Pamela Blair was seen by Dr. Debara Pickett on 09/30/2020.  Pamela Blair had previously been seen by Kerin Ransom, PA-C for dyspnea on exertion which was felt to be her anginal equivalent.  Pamela Blair was advised to start isosorbide.  Pamela Blair did note improvement in her symptoms with the addition of isosorbide medication.  Her hemoglobin continued to improve and  was 11.6 hematocrit of 34.92 months prior.  Her blood pressure was well controlled and her EKG showed sinus rhythm with nonspecific ST changes.  Pamela Blair presents to the clinic today for follow-up evaluation states Pamela Blair has noticed some increased dyspnea with exertion.  Pamela Blair reports that Pamela Blair walks for 15 minutes twice a day and was noticed that with her increased physical activity Pamela Blair has increased shortness of breath.  Pamela Blair does not report increased work of breathing  with normal activities.  We discussed her previous cardiac catheterization and her most recent lipid panel.  Pamela Blair reports that her breathing improved with Imdur.  Pamela Blair reports increased life stressors.  I will order an echocardiogram, increase her Imdur, increase her physical activity, and have her follow-up with Dr. Debara Pickett as scheduled.  Today Pamela Blair denies chest pain, lower extremity edema, fatigue, palpitations, melena, hematuria, hemoptysis, diaphoresis, weakness, presyncope, syncope, orthopnea, and PND.     Home Medications    Prior to Admission medications   Medication Sig Start Date End Date Taking? Authorizing Provider  acetaminophen (TYLENOL) 500 MG tablet Take 500-1,000 mg by mouth every 6 (six) hours as needed for mild pain or moderate pain.     [provider]  amLODipine (NORVASC) 2.5 MG tablet Take 1 tablet (2.5 mg total) by mouth daily. 02/22/20 02/21/21  Pixie Casino, MD  aspirin EC 81 MG tablet Take 81 mg by mouth at bedtime.  11/30/19   Kroeger, Lorelee Cover., PA-C  atorvastatin (LIPITOR) 40 MG tablet TAKE 1 TABLET BY MOUTH EVERY DAY 12/06/20   Hilty, Nadean Corwin, MD  b complex vitamins tablet Take 1 tablet by mouth daily.    [provider]  bimatoprost (LUMIGAN) 0.01 % SOLN Place 1 drop into both eyes at bedtime.    [provider]  buPROPion (WELLBUTRIN XL) 150 MG 24 hr tablet Take 150 mg by mouth every morning. 12/18/19   [provider]  Calcium Carbonate Antacid (CALCIUM CARBONATE PO) Take 1,000 mg by mouth daily.     [provider]  CALCIUM CITRATE PO Take 1,000 mg by mouth daily.    [provider]  Cholecalciferol (VITAMIN D3) 2000 UNITS capsule Take 1 capsule (2,000 Units total) by mouth daily. 07/02/15   Plotnikov, Evie Lacks, MD  Coenzyme Q10 (COQ10 PO) Take 20 mg by mouth daily at 12 noon.  11/30/19   Kroeger, Lorelee Cover., PA-C  diclofenac Sodium (VOLTAREN) 1 % GEL Apply 2 g topically at bedtime as needed (Hip Pain).     [provider]  Evolocumab (REPATHA SURECLICK) 680 MG/ML SOAJ Inject 1 Dose into the skin every 14 (fourteen) days. 03/01/20   Hilty, Nadean Corwin, MD  ferrous sulfate 325 (65 FE) MG tablet Take 325 mg by mouth daily with breakfast.    [provider]  Glycerin-Hypromellose-PEG 400 (DRY EYE RELIEF DROPS) 0.2-0.2-1 % SOLN Place 1 drop into both eyes daily as needed (Dry eye).    [provider]  isosorbide mononitrate (IMDUR) 30 MG 24 hr tablet Take 1 tablet (30 mg total) by mouth daily. 07/30/20 10/28/20  Erlene Quan, PA-C  MAGNESIUM PO Take 1 tablet by mouth daily.     [provider]  metoprolol succinate (TOPROL-XL) 25 MG 24 hr tablet TAKE 1 TABLET BY MOUTH EVERY DAY 02/28/20   Kroeger, Daleen Snook M., PA-C  nitroGLYCERIN (NITROSTAT) 0.4 MG SL tablet Place 1 tablet (0.4 mg total) under the tongue every 5 (five) minutes as needed for chest pain. 11/30/19 02/28/20  Kroeger, Daleen Snook M., PA-C  omega-3 fish oil (MAXEPA) 1000 MG CAPS capsule Take 3,000 mg by mouth daily.     [provider]  pantoprazole (PROTONIX) 40 MG tablet Take 40 mg by mouth daily. 12/20/19   [provider]  TURMERIC PO Take 1 capsule by mouth daily at 12 noon.    [provider]    Family History    Family History  Problem Relation Age of Onset   Stroke Mother 51   Heart attack Father 42   Diabetes Neg Hx    Cancer Neg Hx    Colon cancer Neg Hx    Colon polyps Neg Hx    Stomach cancer Neg Hx    Esophageal cancer Neg Hx    Pamela Blair indicated that her mother is deceased. Pamela Blair indicated that her father is deceased. Pamela Blair indicated that the status of her neg hx is unknown.  Social History    Social History   Socioeconomic History   Marital status: Widowed    Spouse name: Not on file   Number of children: 4   Years of education: Not on file   Highest education level: Not on file  Occupational History   Occupation: house wife  Tobacco Use   Smoking status: Never    Smokeless tobacco: Never  Vaping Use   Vaping Use: Never used  Substance and Sexual Activity   Alcohol use: No    Alcohol/week: 0.0 standard drinks   Drug use: No   Sexual activity: Not on file  Other Topics Concern   Not on file  Social History Narrative   Not on file   Social Determinants of Health   Financial Resource Strain: Not on file  Food Insecurity: Not on file  Transportation Needs: Not on file  Physical Activity: Not on file  Stress: Not on file  Social Connections: Not on file  Intimate Partner Violence: Not on file     Review of Systems    General:  No chills, fever, night sweats or weight changes.  Cardiovascular:  No chest pain, dyspnea on exertion, edema, orthopnea, palpitations, paroxysmal nocturnal dyspnea. Dermatological: No rash, lesions/masses Respiratory: No cough, dyspnea Urologic: No hematuria, dysuria Abdominal:   No nausea, vomiting, diarrhea, bright red blood per rectum, melena, or hematemesis Neurologic:  No visual changes, wkns, changes in mental status. All other systems reviewed and are otherwise negative except as noted above.  Physical Exam    VS:  BP (!) 108/50 (BP Location: Right Arm, Patient Position: Sitting, Cuff Size: Normal)   Pulse 71   Ht 4\' 9"  (1.448 m)   Wt 139 lb 9.6 oz (63.3 kg)   SpO2 97%   BMI 30.21 kg/m  , BMI Body mass index is 30.21 kg/m. GEN: Well nourished, well developed, in no acute distress. HEENT: normal. Neck: Supple, no JVD, carotid bruits, or masses. Cardiac: RRR, no murmurs, rubs, or gallops. No clubbing, cyanosis, edema.  Radials/DP/PT 2+ and equal bilaterally.  Respiratory:  Respirations regular and unlabored, clear to auscultation bilaterally. GI: Soft, nontender, nondistended, BS + x 4. MS: no deformity or atrophy. Skin: warm and dry, no rash. Neuro:  Strength and sensation are intact. Psych: Normal affect.  Accessory Clinical Findings    Recent Labs: 07/30/2020: BUN 15; Creatinine, Ser 0.85;  Hemoglobin 11.6; Platelets 280; Potassium 4.7; Sodium 140   Recent Lipid Panel    Component Value Date/Time   CHOL 93 (L) 11/18/2020 1241   TRIG 83 11/18/2020 1241  HDL 58 11/18/2020 1241   CHOLHDL 1.6 11/18/2020 1241   CHOLHDL 4 10/17/2013 0934   VLDL 12.0 10/17/2013 0934   LDLCALC 18 11/18/2020 1241   LDLDIRECT 158.8 11/11/2012 0935    ECG personally reviewed by me today-none today.  Echocardiogram 12/11/2019 IMPRESSIONS     1. Left ventricular ejection fraction, by estimation, is 60 to 65%. The  left ventricle has normal function. The left ventricle has no regional  wall motion abnormalities. Left ventricular diastolic parameters are  consistent with Grade II diastolic  dysfunction (pseudonormalization).   2. Right ventricular systolic function is normal. The right ventricular  size is normal. There is normal pulmonary artery systolic pressure.   3. Left atrial size was moderately dilated.   4. The mitral valve is normal in structure. Trivial mitral valve  regurgitation. No evidence of mitral stenosis.   5. The aortic valve is grossly normal. Aortic valve regurgitation is  mild. No aortic stenosis is present.  Cardiac catheterization 12/05/2019  Ost Cx to Prox Cx lesion is 80% stenosed. 2nd Mrg lesion is 60% stenosed. 1st Diag lesion is 70% stenosed. Prox LAD to Mid LAD lesion is 60% stenosed. Mid LAD lesion is 75% stenosed. Dist LAD lesion is 60% stenosed. Ost RCA lesion is 20% stenosed. Prox RCA lesion is 20% stenosed. RPDA lesion is 50% stenosed. RPAV-1 lesion is 40% stenosed. RPAV-2 lesion is 40% stenosed. The left ventricular systolic function is normal. LV end diastolic pressure is normal.   Multivessel coronary artery disease with coronary calcification.   The LAD gives rise to a proximal diagonal vessel that has 75% ostial stenosis.  The LAD has 60% stenosis between the first diagonal and septal perforating artery followed by diffuse area of 70 to 80%  stenosis in the mid vessel followed by 60% mid stenosis.   The left circumflex vessel has a very sharp 90 degree angle proximally and in the segment there is an 80% focal stenosis.  There is diffuse 60% stenosis in a large OM1 vessel.   He has mild 20% narrowing followed by 20% mid stenosis.  There is 50% ostial PDA stenosis with 40% stenosis in the distal RCA followed by an additional 40% more distal stenosis.   Preserved global LV contractility with EF estimation at least 55%.  LVEDP 14 mmHg.  There is extensive mitral annular calcification.   RECOMMENDATION: I had a long discussion with the patient and reviewed the catheterization findings.  Since Pamela Blair is not on any anti-ischemic medications Pamela Blair will be discharged today with initiation of metoprolol succinate as well as amlodipine.  Pamela Blair will need follow-up hemoglobin to reassess her progressive anemia with probable need for endoscopy. Titrate medications as blood pressure and heart rate allow.  May need future intervention  Diagnostic Dominance: Right    Intervention   Assessment & Plan   1.  Coronary artery disease-no chest pain today.  No recent episodes of arm neck or throat pain.  Pamela Blair underwent cardiac catheterization 12/05/2019 which showed multivessel coronary artery disease.  Medical management was recommended. Continue amlodipine, metoprolol, isosorbide Heart healthy low-sodium diet-salty 6 given Increase physical activity as tolerated  Dyspnea on exertion-no increased DOE or activity intolerance.  Felt to be equivalent.  Previously had initiation of isosorbide which helped relieve symptoms. Increase isosorbide Continue to monitor Order echocardiogram  Increased stress-reports increased life stressors. Mindfulness stress reduction sheet  Aortic atherosclerosis-echocardiogram 12/11/2019 showed LVEF 60-65%, G2 DD, grossly normal aortic valve, mild aortic valve regurgitation and no aortic stenosis.  Dyslipidemia-11/18/2020:  Cholesterol, Total 93; HDL 58; LDL Chol Calc (NIH) 18; Triglycerides 83 Continue atorvastatin, omega-3 fatty acids Heart healthy low-sodium high-fiber diet Increase physical activity as tolerated  Anemia-hemoglobin 11.6, hematocrit 34.9 on 1 /25/22.  Underwent colonoscopy, endoscopy, and capsule endoscopy which showed small area of erosion of her small bowel but was not a clear source of anemia. Follows with PCP/GI  Disposition: Follow-up with Dr. Debara Pickett in 6 months.  Jossie Ng. Anneka Studer NP-C    01/01/2021, 2:47 PM [place] [address] Office [phone] Fax [phone]  Notice: This dictation was prepared with Dragon dictation along with smaller phrase technology. Any transcriptional errors that result from this process are unintentional and may not be corrected upon review.  I spent 15 minutes examining this patient, reviewing medications, and using patient centered shared decision making involving her cardiac care.  Prior to her visit I spent greater than 20 minutes reviewing her past medical history,  medications, and prior cardiac tests.

## 2021-01-01 ENCOUNTER — Other Ambulatory Visit: Payer: Self-pay

## 2021-01-01 ENCOUNTER — Ambulatory Visit (INDEPENDENT_AMBULATORY_CARE_PROVIDER_SITE_OTHER): Payer: Medicare Other | Admitting: General Practice

## 2021-01-01 ENCOUNTER — Encounter: Payer: Self-pay | Admitting: General Practice

## 2021-01-01 VITALS — BP 108/50 | HR 71 | Ht <= 58 in | Wt 139.6 lb

## 2021-01-01 DIAGNOSIS — D649 Anemia, unspecified: Secondary | ICD-10-CM | POA: Diagnosis not present

## 2021-01-01 DIAGNOSIS — I700 Atherosclerosis of aorta: Secondary | ICD-10-CM | POA: Diagnosis not present

## 2021-01-01 DIAGNOSIS — R0609 Other forms of dyspnea: Secondary | ICD-10-CM

## 2021-01-01 DIAGNOSIS — I2510 Atherosclerotic heart disease of native coronary artery without angina pectoris: Secondary | ICD-10-CM

## 2021-01-01 DIAGNOSIS — R0600 Dyspnea, unspecified: Secondary | ICD-10-CM

## 2021-01-01 DIAGNOSIS — E785 Hyperlipidemia, unspecified: Secondary | ICD-10-CM

## 2021-01-01 DIAGNOSIS — F439 Reaction to severe stress, unspecified: Secondary | ICD-10-CM | POA: Diagnosis not present

## 2021-01-01 MED ORDER — ISOSORBIDE MONONITRATE ER 60 MG PO TB24: 60.0000 mg | ORAL_TABLET | Freq: Every day | ORAL | 6 refills | Status: DC

## 2021-01-01 NOTE — Patient Instructions (Addendum)
Medication Instructions:  INCREASE ISOSORBIDE 60 MG DAILY  *If you need a refill on your cardiac medications before your next appointment, please call your pharmacy*  Lab Work: NONE  Testing/Procedures: Echocardiogram - Your physician has requested that you have an echocardiogram. Echocardiography is a painless test that uses sound waves to create images of your heart. It provides your doctor with information about the size and shape of your heart and how well your heart's chambers and valves are working. This procedure takes approximately one hour. There are no restrictions for this procedure. This will be performed at our [facility] location - [address], [address].  PLEASE READ AND FOLLOW STRESS TIPS-ATTACHED  Follow-Up: Your next appointment:  KEEP SCHEDULED APPOINTMENT  In Person  At [facility], you and your health needs are our priority.  As part of our continuing mission to provide you with exceptional heart care, we have created designated Provider Care Teams.  These Care Teams include your primary Cardiologist (physician) and Advanced Practice Providers (APPs -  Physician Assistants and Nurse Practitioners) who all work together to provide you with the care you need, when you need it.    Mindfulness-Based Stress Reduction Mindfulness-based stress reduction (MBSR) is a program that helps people learn to practice mindfulness. Mindfulness is the practice of intentionally paying attention to the present moment. MBSR focuses on developing self-awareness, which allows you to respond to life stress without judgment or negative emotions. It can be learned and practiced through techniques such as education, breathing exercises, meditation, and yoga. MBSR includes several mindfulnesstechniques in one program. MBSR works best when you understand the treatment, are willing to try new things, and can commit to spending time practicing what you learn. MBSR training may include learning  about: How your emotions, thoughts, and reactions affect your body. New ways to respond to things that cause negative thoughts to start (triggers). How to notice your thoughts and let go of them. Practicing awareness of everyday things that you normally do without thinking. The techniques and goals of different types of meditation. What are the benefits of MBSR? MBSR can have many benefits, which include helping you to: Develop self-awareness. This refers to knowing and understanding yourself. Learn skills and attitudes that help you to participate in your own health care. Learn new ways to care for yourself. Be more accepting about how things are, and let things go. Be less judgmental and approach things with an open mind. Be patient with yourself and trust yourself more. MBSR has also been shown to: Reduce negative emotions, such as depression and anxiety. Improve memory and focus. Change how you sense and approach pain. Boost your body's ability to fight infections. Help you connect better with other people. Improve your sense of well-being. Follow these instructions at home:  Find a local in-person or online MBSR program. Set aside some time regularly for mindfulness practice. Find a mindfulness practice that works best for you. This may include one or more of the following: Meditation. Meditation involves focusing your mind on a certain thought or activity. Breathing awareness exercises. These help you to stay present by focusing on your breath. Body scan. For this practice, you lie down and pay attention to each part of your body from head to toe. You can identify tension and soreness and intentionally relax parts of your body. Yoga. Yoga involves stretching and breathing, and it can improve your ability to move and be flexible. It can also provide an experience of testing  your body's limits, which can help you release stress. Mindful eating. This way of eating involves focusing  on the taste, texture, color, and smell of each bite of food. Because this slows down eating and helps you feel full sooner, it can be an important part of a weight-loss plan. Find a podcast or recording that provides guidance for breathing awareness, body scan, or meditation exercises. You can listen to these any time when you have a free moment to rest without distractions. Follow your treatment plan as told by your health care provider. This may include taking regular medicines and making changes to your diet or lifestyle as recommended. How to practice mindfulness To do a basic awareness exercise: Find a comfortable place to sit. Pay attention to the present moment. Observe your thoughts, feelings, and surroundings just as they are. Avoid placing judgment on yourself, your feelings, or your surroundings. Make note of any judgment that comes up, and let it go. Your mind may wander, and that is okay. Make note of when your thoughts drift, and return your attention to the present moment. To do basic mindfulness meditation: Find a comfortable place to sit. This may include a stable chair or a firm floor cushion. Sit upright with your back straight. Let your arms fall next to your side with your hands resting on your legs. If sitting in a chair, rest your feet flat on the floor. If sitting on a cushion, cross your legs in front of you. Keep your head in a neutral position with your chin dropped slightly. Relax your jaw and rest the tip of your tongue on the roof of your mouth. Drop your gaze to the floor. You can close your eyes if you like. Breathe normally and pay attention to your breath. Feel the air moving in and out of your nose. Feel your belly expanding and relaxing with each breath. Your mind may wander, and that is okay. Make note of when your thoughts drift, and return your attention to your breath. Avoid placing judgment on yourself, your feelings, or your surroundings. Make note of any  judgment or feelings that come up, let them go, and bring your attention back to your breath. When you are ready, lift your gaze or open your eyes. Pay attention to how your body feels after the meditation. Where to find more information You can find more information about MBSR from: Your health care provider. Community-based meditation centers or programs. Programs offered near you. Summary Mindfulness-based stress reduction (MBSR) is a program that teaches you how to intentionally pay attention to the present moment. It is used with other treatments to help you cope better with daily stress, emotions, and pain. MBSR focuses on developing self-awareness, which allows you to respond to life stress without judgment or negative emotions. MBSR programs may involve learning different mindfulness practices, such as breathing exercises, meditation, yoga, body scan, or mindful eating. Find a mindfulness practice that works best for you, and set aside time for it on a regular basis. This information is not intended to replace advice given to you by your health care provider. Make sure you discuss any questions you have with your healthcare provider. Document Revised: 03/08/2020 Document Reviewed: 03/08/2020 Elsevier Patient Education  2022 [employer].

## 2021-01-02 DIAGNOSIS — G5601 Carpal tunnel syndrome, right upper limb: Secondary | ICD-10-CM | POA: Diagnosis not present

## 2021-01-02 DIAGNOSIS — M25531 Pain in right wrist: Secondary | ICD-10-CM | POA: Diagnosis not present

## 2021-01-20 ENCOUNTER — Ambulatory Visit (HOSPITAL_COMMUNITY): Payer: Medicare Other | Attending: General Practice

## 2021-01-20 ENCOUNTER — Other Ambulatory Visit: Payer: Self-pay

## 2021-01-20 DIAGNOSIS — R0600 Dyspnea, unspecified: Secondary | ICD-10-CM | POA: Insufficient documentation

## 2021-01-20 DIAGNOSIS — R0609 Other forms of dyspnea: Secondary | ICD-10-CM

## 2021-01-20 LAB — ECHOCARDIOGRAM COMPLETE
Area-P 1/2: 2.81 cm2
S' Lateral: 2.9 cm

## 2021-01-21 ENCOUNTER — Ambulatory Visit (INDEPENDENT_AMBULATORY_CARE_PROVIDER_SITE_OTHER): Payer: Medicare Other | Admitting: Internal Medicine

## 2021-01-21 VITALS — BP 110/62 | HR 63 | Ht 60.0 in | Wt 140.4 lb

## 2021-01-21 DIAGNOSIS — I25119 Atherosclerotic heart disease of native coronary artery with unspecified angina pectoris: Secondary | ICD-10-CM

## 2021-01-21 DIAGNOSIS — R0600 Dyspnea, unspecified: Secondary | ICD-10-CM

## 2021-01-21 DIAGNOSIS — R0609 Other forms of dyspnea: Secondary | ICD-10-CM

## 2021-01-21 DIAGNOSIS — I2510 Atherosclerotic heart disease of native coronary artery without angina pectoris: Secondary | ICD-10-CM

## 2021-01-21 DIAGNOSIS — I10 Essential (primary) hypertension: Secondary | ICD-10-CM

## 2021-01-21 NOTE — Patient Instructions (Signed)
Medication Instructions:  TAKE isosorbide 30mg  x2 daily (total of 60mg ) A prescription for 60mg  tablets has been sent to the pharmacy in June after your visit with Denyse Amass NP  *If you need a refill on your cardiac medications before your next appointment, please call your pharmacy*  Follow-Up: At [facility], you and your health needs are our priority.  As part of our continuing mission to provide you with exceptional heart care, we have created designated Provider Care Teams.  These Care Teams include your primary Cardiologist (physician) and Advanced Practice Providers (APPs -  Physician Assistants and Nurse Practitioners) who all work together to provide you with the care you need, when you need it.  We recommend signing up for the patient portal called "MyChart".  Sign up information is provided on this After Visit Summary.  MyChart is used to connect with patients for Virtual Visits (Telemedicine).  Patients are able to view lab/test results, encounter notes, upcoming appointments, etc.  Non-urgent messages can be sent to your provider as well.   To learn more about what you can do with MyChart, go to [email or web address].    Your next appointment:   1 month(s)  The format for your next appointment:   In Person  Provider:   Dr. Debara Pickett or Denyse Amass NP ** OK to double book with Dr. Debara Pickett or schedule at [facility]    Other Instructions

## 2021-01-21 NOTE — Progress Notes (Signed)
OFFICE NOTE  Chief Complaint:  Follow-up breathing issues  Primary Care Physician: Crist Infante, MD  HPI:  Pamela Blair is a pleasant 80 year old female who was present followed by Dr. Linna Darner and currently is seeing Dr. Hardie Shackleton. She was referred for evaluation of chest pain per Dr. Huntley Estelle notes, however she is "not clear why she is here". She occasionally has some chest discomfort in fact she notes some pain in her left upper chest and left shoulder blade particular when moving and lifting things. She gets pain it goes down her left arm. She does have a history of neck surgery from an anterior approach. 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 She does have a prior spinal compression fracture. In addition she seemed to have aortic atherosclerosis on imaging. A family history is not significant for any coronary disease. She does have some [place] risk factors including dyslipidemia and was recently started on pravastatin. She was also prescribed etodolac for arthritis, but she never got the prescription filled and is not taking it. Her chest pain is not necessarily worse with exertion or relieved by rest. She does report some increasing shortness of breath which is unusual for her.  10/24/2019  Pamela Blair returns today for follow-up.  I last saw her in 2016 and therefore she is considered a new patient.  In the past she had undergone some testing for presumed angina although she had no complaints at that time.  Today she is also without any significant symptoms.  She reported that she had some unusual symptoms that were concerning for possible heart disease.  She has been under a lot of stress as her husband apparently died over the holidays from COVID-19.  She says she was out of town and was able to come back with her son who was a Engineer, drilling.  They were living in [place].  She is describing more shortness of breath and fatigue and seems to be  quite emotional at this point.  She denies any pain in the chest.  She also feels some palpitations at times or symptoms of her heart racing.  02/22/2020  Pamela Blair returns today for follow-up from left heart catheterization.  She had been complaining of symptoms concerning for coronary disease and I recommended a CT coronary angiogram.  I personally interpreted this study which demonstrated least moderate mixed proximal LAD stenosis.  The calcium score was significantly elevated at 901, 92nd percentile for age and sex matched control.  Additionally, her FFR demonstrated significant flow limitation in the proximal to mid LAD as suspected and possibly the mid circumflex artery.  Subsequently she was referred for definitive cardiac catheterization on 12/05/2019.  This demonstrated multivessel coronary artery calcification with 75% proximal diagonal ostial stenosis, 60% proximal LAD stenosis and diffuse 7080% stenosis in the mid LAD.  There was an 80% focal stenosis in left circumflex and 60% diffuse stenosis in a large OM vessel.  LVEF was 55%.  Based on the cath findings and the fact that she was not on anti-ischemic therapy, she was placed on metoprolol and amlodipine and given her recent anemia, further work-up was recommended.  She did undergo significant GI work-up by Dr. Loletha Carrow due to heme positive stool, including colonoscopy in 11/2019, endoscopy and capsule endoscopy which showed one small area of erosion in the small bowel but no clear sources of her anemia.  She was subsequently transfused and provided iron which she remains on.  Anemia has significantly improved from 8.7 hemoglobin 2 months ago to 12.5.,  Iron, TIBC and trans sat ratios are now normal.  In the office today she reports that she feels better.  She does not have any of the typical anginal symptoms she was describing previously.  EKG is normal sinus rhythm without ischemia today.  Blood pressure is noted to be low at 98/60 and more recently she  has had some lower blood pressures based on home readings.  In addition she has been complaining of bilateral leg pain and cramping which started after increasing her atorvastatin from 40 to 80 mg daily.  That being said her most recent LDL in September 2020 was 150.  She will likely need more definitive therapy to reach a target LDL less than 70.  05/08/2020  Pamela Blair returns today for follow-up with her son.  She reports no symptoms of chest pain or worsening shortness of breath.  Apparently her hemoglobin has improved somewhat and she had had transfusion and remains on iron.  Recent lipids showed improvement with total cholesterol 138, triglycerides 78, HDL 43 and LDL of 180.  She just started on Repatha, having taken her first dose this week.  Apparently her PCP had instructed them to only use Repatha once monthly because of the concern of a very low LDL cholesterol.  I also think that her LDL will be quite low however there is no study that I am aware of demonstrating the efficacy of once monthly PCSK9 inhibitors-in fact the pharmacokinetics suggest that during the month in between doses, the cholesterol is likely to rise back to the pretreatment levels.  Ultimately though, I think we could consider discontinuing her ezetimibe or reducing her statin dose.  09/30/2020  Pamela Blair returns today for follow-up.  She was recently seen by Kerin Ransom, PA-C for apnea on exertion which is her possible anginal equivalent.  This was a virtual visit.  He advised her to start on isosorbide.  She says today in the office, and was accompanied by her son, that she has had improvement in her symptoms and he has noticed that on the isosorbide.  This is suggestive of an anti-ischemic effect.  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  This has improved somewhat with recent hemoglobin of 11.6 and hematocrit of 34.9, about 2 months ago.  Blood  pressure is good today.  She shows a sinus rhythm with nonspecific ST changes.  01/21/2021  Pamela Blair is seen today in follow-up.  She recently saw Coletta Memos, NP with symptoms of worsening issues with breathing and needing to take a deep breath, but she does not describe it as shortness of breath.  She also frankly denies any angina.  She did have a cardiac catheterization about a year ago which showed multivessel coronary disease however not significant enough to pursue intervention and we discussed the importance of improving her anemia and medical therapy.  She has been on isosorbide.  Denyse Amass decided to increase her isosorbide from 30 to 60 mg daily however she has not made the change in her medication yet.  An echocardiogram was ordered and performed yesterday which showed essentially no change in her systolic function with mild diastolic function and mild mitral regurgitation.  She has had good response to Repatha with marked improvement in her LDL down to 18, at which time I stopped her Zetia have continued her on atorvastatin.  Repeat lipids showed total  cholesterol now 110, HDL 50, LDL 49 and triglycerides 57.  PMHx:  Past Medical History:  Diagnosis Date   Blood transfusion without reported diagnosis    Cervical spinal stenosis    Cervical spondylosis    Hyperlipidemia    Osteoporosis     Past Surgical History:  Procedure Laterality Date   ABDOMINAL HYSTERECTOMY     and BSO, dx- dysfunctional bleeding   ANTERIOR CERVICAL DECOMP/DISCECTOMY FUSION  10/3-10/2010   Dr Pamala Hurry , [place] , [facility]    CESAREAN SECTION     x 4   COLONOSCOPY     Dr Deatra Ina   ENTEROSCOPY N/A 01/05/2020   Procedure: ENTEROSCOPY;  Surgeon: Doran Stabler, MD;  Location: [facility];  Service: Gastroenterology;  Laterality: N/A;   HOT HEMOSTASIS N/A 01/05/2020   Procedure: HOT HEMOSTASIS (ARGON PLASMA COAGULATION/BICAP);  Surgeon: Doran Stabler, MD;  Location: [facility];  Service: Gastroenterology;   Laterality: N/A;   LAPAROSCOPIC SMALL BOWEL RESECTION     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 She has never used smokeless tobacco. She reports that she does not drink alcohol and does not use drugs.  ALLERGIES:  Allergies  Allergen Reactions   Niacin     rash   Clarithromycin Other (See Comments)    unknown reaction   Neomycin-Bacitracin-Polymyxin  [Bacitracin-Neomycin-Polymyxin]    Neomycin Rash    ROS: Pertinent items noted in HPI and remainder of comprehensive ROS otherwise negative.  HOME MEDS: Current Outpatient Medications  Medication Sig Dispense Refill   acetaminophen (TYLENOL) 500 MG tablet Take 500-1,000 mg by mouth every 6 (six) hours as needed for mild pain or moderate pain.      amLODipine (NORVASC) 2.5 MG tablet Take 1 tablet (2.5 mg total) by mouth daily. 90 tablet 3   aspirin EC 81 MG tablet Take 81 mg by mouth at bedtime.      atorvastatin (LIPITOR) 40 MG tablet TAKE 1 TABLET BY MOUTH EVERY DAY 90 tablet 3   b complex vitamins tablet Take 1 tablet by mouth daily.     bimatoprost (LUMIGAN) 0.01 % SOLN Place 1 drop into both eyes at bedtime.     buPROPion (WELLBUTRIN XL) 150 MG 24 hr tablet Take 150 mg by mouth every morning.     Calcium Carbonate Antacid (CALCIUM CARBONATE PO) Take 1,000 mg by mouth daily.      CALCIUM CITRATE PO Take 1,000 mg by mouth daily.     Cholecalciferol (VITAMIN D3) 2000 UNITS capsule Take 1  capsule (2,000 Units total) by mouth daily. 100 capsule 3   Coenzyme Q10 (COQ10 PO) Take 20 mg by mouth daily at 12 noon.   0   Evolocumab (REPATHA SURECLICK) 283 MG/ML SOAJ Inject 1 Dose into the skin every 14 (fourteen) days.  2.1 mL 11   ferrous sulfate 325 (65 FE) MG tablet Take 325 mg by mouth daily with breakfast.     isosorbide mononitrate (IMDUR) 60 MG 24 hr tablet Take 1 tablet (60 mg total) by mouth daily. 30 tablet 6   MAGNESIUM PO Take 1 tablet by mouth daily.      metoprolol succinate (TOPROL-XL) 25 MG 24 hr tablet TAKE 1 TABLET BY MOUTH EVERY DAY 90 tablet 3   omega-3 fish oil (MAXEPA) 1000 MG CAPS capsule Take 3,000 mg by mouth daily.      pantoprazole (PROTONIX) 40 MG tablet Take 40 mg by mouth daily.     TURMERIC PO Take 1 capsule by mouth daily at 12 noon.     nitroGLYCERIN (NITROSTAT) 0.4 MG SL tablet Place 1 tablet (0.4 mg total) under the tongue every 5 (five) minutes as needed for chest pain. 25 tablet 3   No current facility-administered medications for this visit.    LABS/IMAGING: No results found for this or any previous visit (from the past 48 hour(s)). ECHOCARDIOGRAM COMPLETE  Result Date: 01/20/2021    ECHOCARDIOGRAM REPORT   Patient Name:   Pamela Blair Date of Exam: 01/20/2021 Medical Rec #:  [id number]      Height:       57.0 in Accession #:    [id number]     Weight:       139.6 lb Date of Birth:  1940-12-29       BSA:          1.544 m Patient Age:    12 years       BP:           108/50 mmHg Patient Gender: F              HR:           60 bpm. Exam Location:  [address] Procedure: 2D Echo, Cardiac Doppler and Color Doppler Indications:    R06.000 Dyspnea on exertion  History:        Patient has prior history of Echocardiogram examinations, most                 recent 12/11/2019. CAD, Abnormal ECG, Signs/Symptoms:Chest Pain;                 Risk Factors:Dyslipidemia. Anemia.  Sonographer:    Diamond Nickel RCS Referring Phys: [id number] [place]  1.  Left ventricular ejection fraction, by estimation, is 60 to 65%. The left ventricle has normal function. The left ventricle has no regional wall motion abnormalities. There is mild concentric left ventricular hypertrophy. Left ventricular diastolic parameters are consistent with Grade I diastolic dysfunction (impaired relaxation).  2. Right ventricular systolic function is normal. The right ventricular size is normal. There is normal pulmonary artery systolic pressure.  3. Left atrial size was mildly dilated.  4. The mitral valve is normal in structure. Mild mitral valve regurgitation. No evidence of mitral stenosis.  5. The aortic valve is tricuspid. There is moderate calcification of the aortic valve. There is moderate thickening of the aortic valve. Aortic valve regurgitation is not visualized. Mild aortic valve sclerosis is present, with no evidence of aortic valve stenosis. Comparison(s): A prior study was performed on 12/11/19. No significant change from prior study. Prior images reviewed side by side. FINDINGS  Left Ventricle: Left ventricular ejection fraction, by estimation, is 60 to 65%. The left ventricle has normal function. The left ventricle has no regional  wall motion abnormalities. The left ventricular internal cavity size was normal in size. There is  mild concentric left ventricular hypertrophy. Left ventricular diastolic parameters are consistent with Grade I diastolic dysfunction (impaired relaxation). Right Ventricle: The right ventricular size is normal. No increase in right ventricular wall thickness. Right ventricular systolic function is normal. There is normal pulmonary artery systolic pressure. The tricuspid regurgitant velocity is 2.36 m/s, and  with an assumed right atrial pressure of 3 mmHg, the estimated right ventricular systolic pressure is 34.7 mmHg. Left Atrium: Left atrial size was mildly dilated. Right Atrium: Right atrial size was normal in size. Pericardium: There is no evidence  of pericardial effusion. Mitral Valve: The mitral valve is normal in structure. Mild to moderate mitral annular calcification. Mild mitral valve regurgitation. No evidence of mitral valve stenosis. Tricuspid Valve: The tricuspid valve is normal in structure. Tricuspid valve regurgitation is mild. Aortic Valve: The aortic valve is tricuspid. There is moderate calcification of the aortic valve. There is moderate thickening of the aortic valve. There is moderate aortic valve annular calcification. Aortic valve regurgitation is not visualized. Mild aortic valve sclerosis is present, with no evidence of aortic valve stenosis. Pulmonic Valve: The pulmonic valve was normal in structure. Pulmonic valve regurgitation is mild. No evidence of pulmonic stenosis. Aorta: The aortic root is normal in size and structure. IAS/Shunts: The atrial septum is grossly normal.  LEFT VENTRICLE PLAX 2D LVIDd:         4.00 cm  Diastology LVIDs:         2.90 cm  LV e' medial:    5.43 cm/s LV PW:         1.20 cm  LV E/e' medial:  13.8 LV IVS:        1.10 cm  LV e' lateral:   7.08 cm/s LVOT diam:     1.80 cm  LV E/e' lateral: 10.6 LV SV:         51 LV SV Index:   33 LVOT Area:     2.54 cm  RIGHT VENTRICLE RV S prime:     13.10 cm/s RVSP:           25.3 mmHg LEFT ATRIUM             Index       RIGHT ATRIUM           Index LA diam:        3.10 cm 2.01 cm/m  RA Pressure: 3.00 mmHg LA Vol (A2C):   71.3 ml 46.19 ml/m RA Area:     11.70 cm LA Vol (A4C):   45.7 ml 29.60 ml/m RA Volume:   25.50 ml  16.52 ml/m LA Biplane Vol: 60.9 ml 39.45 ml/m  AORTIC VALVE LVOT Vmax:   84.90 cm/s LVOT Vmean:  53.500 cm/s LVOT VTI:    0.199 m  AORTA Ao Root diam: 3.20 cm MITRAL VALVE               TRICUSPID VALVE MV Area (PHT): 2.81 cm    TR Peak grad:   22.3 mmHg MV Decel Time: 270 msec    TR Vmax:        236.00 cm/s MV E velocity: 75.00 cm/s  Estimated RAP:  3.00 mmHg MV A velocity: 85.70 cm/s  RVSP:           25.3 mmHg MV E/A ratio:  0.88  SHUNTS                            Systemic VTI:  0.20 m                            Systemic Diam: 1.80 cm Rudean Haskell MD Electronically signed by Rudean Haskell MD Signature Date/Time: 01/20/2021/5:42:07 PM    Final     WEIGHTS: Wt Readings from Last 3 Encounters:  01/21/21 140 lb 6.4 oz (63.7 kg)  01/01/21 139 lb 9.6 oz (63.3 kg)  09/30/20 142 lb 6.4 oz (64.6 kg)    VITALS: BP 110/62   Pulse 63   Ht 5' (1.524 m)   Wt 140 lb 6.4 oz (63.7 kg)   SpO2 99%   BMI 27.42 kg/m   EXAM: General appearance: alert and no distress Neck: no carotid bruit and no JVD Lungs: clear to auscultation bilaterally Heart: regular rate and rhythm, S1, S2 normal, no murmur, click, rub or gallop Abdomen: soft, non-tender; bowel sounds normal; no masses,  no organomegaly Extremities: Reproducible left upper chest and left upper scapular pain on palpation Pulses: 2+ and symmetric Skin: Skin color, texture, turgor normal. 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   Pamela Blair has had progressive difficulty taking a deep breath but not clearly dyspnea, fatigue but no chest pain.  Cardiac catheterization recommended medical therapy but did show moderate to severe coronary disease.  This was about a year ago so she is done fairly well on isosorbide.  It was recently recommended to increase this but she did not try the medication change.  Repeat echo shows no significant change from her prior study.  I advised her to try to take 2 of her current isosorbide to see if it  improves her symptoms.  If she does find she is breathing better than she should continue on the 60 mg daily dose.  If there is no significant improvement, we may need to consider repeat catheterization.  I like to see her back soon to determine this.  Pixie Casino, MD, [facility], [place] Director of the [facility]  [facility] Diplomate of the American Board of Clinical Lipidology Attending Cardiologist  Direct Dial: [phone]  Fax: [phone]  Website:  [email or web address] 01/21/2021, 2:16 PM

## 2021-01-23 ENCOUNTER — Other Ambulatory Visit (HOSPITAL_COMMUNITY): Payer: Self-pay

## 2021-01-24 ENCOUNTER — Other Ambulatory Visit: Payer: Self-pay

## 2021-01-24 ENCOUNTER — Ambulatory Visit (HOSPITAL_COMMUNITY)
Admission: RE | Admit: 2021-01-24 | Discharge: 2021-01-24 | Disposition: A | Discharge status: Home / Self Care | Payer: Medicare Other | Source: Ambulatory Visit | Attending: Internal Medicine | Admitting: Internal Medicine

## 2021-01-24 DIAGNOSIS — M810 Age-related osteoporosis without current pathological fracture: Secondary | ICD-10-CM | POA: Diagnosis not present

## 2021-01-24 MED ORDER — DENOSUMAB 60 MG/ML ~~LOC~~ SOSY
PREFILLED_SYRINGE | SUBCUTANEOUS | Status: AC
  Filled 2021-01-24: qty 1

## 2021-01-24 MED ORDER — DENOSUMAB 60 MG/ML ~~LOC~~ SOSY
60.0000 mg | PREFILLED_SYRINGE | Freq: Once | SUBCUTANEOUS | Status: AC
  Administered 2021-01-24: 60 mg via SUBCUTANEOUS

## 2021-01-27 DIAGNOSIS — M25511 Pain in right shoulder: Secondary | ICD-10-CM | POA: Diagnosis not present

## 2021-01-27 DIAGNOSIS — M75101 Unspecified rotator cuff tear or rupture of right shoulder, not specified as traumatic: Secondary | ICD-10-CM | POA: Diagnosis not present

## 2021-02-12 ENCOUNTER — Other Ambulatory Visit: Payer: Self-pay | Admitting: *Deleted

## 2021-02-12 MED ORDER — ISOSORBIDE MONONITRATE ER 60 MG PO TB24: 60.0000 mg | ORAL_TABLET | Freq: Every day | ORAL | 6 refills | Status: DC

## 2021-02-14 DIAGNOSIS — M25511 Pain in right shoulder: Secondary | ICD-10-CM | POA: Diagnosis not present

## 2021-02-14 DIAGNOSIS — M75101 Unspecified rotator cuff tear or rupture of right shoulder, not specified as traumatic: Secondary | ICD-10-CM | POA: Diagnosis not present

## 2021-02-18 ENCOUNTER — Encounter: Payer: Self-pay | Admitting: Internal Medicine

## 2021-02-18 ENCOUNTER — Other Ambulatory Visit: Payer: Self-pay

## 2021-02-18 ENCOUNTER — Ambulatory Visit (INDEPENDENT_AMBULATORY_CARE_PROVIDER_SITE_OTHER): Payer: Medicare Other | Admitting: Internal Medicine

## 2021-02-18 VITALS — BP 122/68 | HR 63 | Ht 60.0 in | Wt 140.4 lb

## 2021-02-18 DIAGNOSIS — D649 Anemia, unspecified: Secondary | ICD-10-CM | POA: Diagnosis not present

## 2021-02-18 DIAGNOSIS — R0609 Other forms of dyspnea: Secondary | ICD-10-CM

## 2021-02-18 DIAGNOSIS — I2510 Atherosclerotic heart disease of native coronary artery without angina pectoris: Secondary | ICD-10-CM

## 2021-02-18 DIAGNOSIS — I25119 Atherosclerotic heart disease of native coronary artery with unspecified angina pectoris: Secondary | ICD-10-CM | POA: Diagnosis not present

## 2021-02-18 DIAGNOSIS — R0600 Dyspnea, unspecified: Secondary | ICD-10-CM

## 2021-02-18 NOTE — Progress Notes (Signed)
OFFICE NOTE  Chief Complaint:  Follow-up breathing issues  Primary Care Physician: Crist Infante, MD  HPI:  Pamela Blair is a pleasant 80 year old female who was present followed by Dr. Linna Darner and currently is seeing Dr. Hardie Shackleton. She was referred for evaluation of chest pain per Dr. Huntley Estelle notes, however she is "not clear why she is here". She occasionally has some chest discomfort in fact she notes some pain in her left upper chest and left shoulder blade particular when moving and lifting things. She gets pain it goes down her left arm. She does have a history of neck surgery from an anterior approach. 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 She does have a prior spinal compression fracture. In addition she seemed to have aortic atherosclerosis on imaging. A family history is not significant for any coronary disease. She does have some [place] risk factors including dyslipidemia and was recently started on pravastatin. She was also prescribed etodolac for arthritis, but she never got the prescription filled and is not taking it. Her chest pain is not necessarily worse with exertion or relieved by rest. She does report some increasing shortness of breath which is unusual for her.  10/24/2019  Pamela Blair returns today for follow-up.  I last saw her in 2016 and therefore she is considered a new patient.  In the past she had undergone some testing for presumed angina although she had no complaints at that time.  Today she is also without any significant symptoms.  She reported that she had some unusual symptoms that were concerning for possible heart disease.  She has been under a lot of stress as her husband apparently died over the holidays from COVID-19.  She says she was out of town and was able to come back with her son who was a Engineer, drilling.  They were living in [place].  She is describing more shortness of breath and fatigue and seems to be  quite emotional at this point.  She denies any pain in the chest.  She also feels some palpitations at times or symptoms of her heart racing.  02/22/2020  Pamela Blair returns today for follow-up from left heart catheterization.  She had been complaining of symptoms concerning for coronary disease and I recommended a CT coronary angiogram.  I personally interpreted this study which demonstrated least moderate mixed proximal LAD stenosis.  The calcium score was significantly elevated at 901, 92nd percentile for age and sex matched control.  Additionally, her FFR demonstrated significant flow limitation in the proximal to mid LAD as suspected and possibly the mid circumflex artery.  Subsequently she was referred for definitive cardiac catheterization on 12/05/2019.  This demonstrated multivessel coronary artery calcification with 75% proximal diagonal ostial stenosis, 60% proximal LAD stenosis and diffuse 7080% stenosis in the mid LAD.  There was an 80% focal stenosis in left circumflex and 60% diffuse stenosis in a large OM vessel.  LVEF was 55%.  Based on the cath findings and the fact that she was not on anti-ischemic therapy, she was placed on metoprolol and amlodipine and given her recent anemia, further work-up was recommended.  She did undergo significant GI work-up by Dr. Loletha Carrow due to heme positive stool, including colonoscopy in 11/2019, endoscopy and capsule endoscopy which showed one small area of erosion in the small bowel but no clear sources of her anemia.  She was subsequently transfused and provided iron which she remains on.  Anemia has significantly improved from 8.7 hemoglobin 2 months ago to 12.5.,  Iron, TIBC and trans sat ratios are now normal.  In the office today she reports that she feels better.  She does not have any of the typical anginal symptoms she was describing previously.  EKG is normal sinus rhythm without ischemia today.  Blood pressure is noted to be low at 98/60 and more recently she  has had some lower blood pressures based on home readings.  In addition she has been complaining of bilateral leg pain and cramping which started after increasing her atorvastatin from 40 to 80 mg daily.  That being said her most recent LDL in September 2020 was 150.  She will likely need more definitive therapy to reach a target LDL less than 70.  05/08/2020  Pamela Blair returns today for follow-up with her son.  She reports no symptoms of chest pain or worsening shortness of breath.  Apparently her hemoglobin has improved somewhat and she had had transfusion and remains on iron.  Recent lipids showed improvement with total cholesterol 138, triglycerides 78, HDL 43 and LDL of 180.  She just started on Repatha, having taken her first dose this week.  Apparently her PCP had instructed them to only use Repatha once monthly because of the concern of a very low LDL cholesterol.  I also think that her LDL will be quite low however there is no study that I am aware of demonstrating the efficacy of once monthly PCSK9 inhibitors-in fact the pharmacokinetics suggest that during the month in between doses, the cholesterol is likely to rise back to the pretreatment levels.  Ultimately though, I think we could consider discontinuing her ezetimibe or reducing her statin dose.  09/30/2020  Pamela Blair returns today for follow-up.  She was recently seen by Kerin Ransom, PA-C for apnea on exertion which is her possible anginal equivalent.  This was a virtual visit.  He advised her to start on isosorbide.  She says today in the office, and was accompanied by her son, that she has had improvement in her symptoms and he has noticed that on the isosorbide.  This is suggestive of an anti-ischemic effect.  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  This has improved somewhat with recent hemoglobin of 11.6 and hematocrit of 34.9, about 2 months ago.  Blood  pressure is good today.  She shows a sinus rhythm with nonspecific ST changes.  01/21/2021  Pamela Blair is seen today in follow-up.  She recently saw Coletta Memos, NP with symptoms of worsening issues with breathing and needing to take a deep breath, but she does not describe it as shortness of breath.  She also frankly denies any angina.  She did have a cardiac catheterization about a year ago which showed multivessel coronary disease however not significant enough to pursue intervention and we discussed the importance of improving her anemia and medical therapy.  She has been on isosorbide.  Denyse Amass decided to increase her isosorbide from 30 to 60 mg daily however she has not made the change in her medication yet.  An echocardiogram was ordered and performed yesterday which showed essentially no change in her systolic function with mild diastolic function and mild mitral regurgitation.  She has had good response to Repatha with marked improvement in her LDL down to 18, at which time I stopped her Zetia have continued her on atorvastatin.  Repeat lipids showed total  cholesterol now 110, HDL 50, LDL 49 and triglycerides 57.  02/18/2021  Izzy returns today for follow-up.  She is still quite concerned about her coronaries.  She is somewhat anxious that she may have a blockage.  She does not understand why we do not go ahead and just do another catheterization and put stents in, however it does not seem that she is actually having active angina.  She has been on isosorbide at an increased dose with no significant symptoms at this point.  Her lipids are well controlled with LDL 49 in June.  EKG shows some nonspecific ST and T wave changes.  Blood pressure is well controlled.  She did have an issue with anemia previously however hemoglobin had come up to 11.6.  This will need to be reassessed  PMHx:  Past Medical History:  Diagnosis Date   Blood transfusion without reported diagnosis    Cervical spinal stenosis     Cervical spondylosis    Hyperlipidemia    Osteoporosis     Past Surgical History:  Procedure Laterality Date   ABDOMINAL HYSTERECTOMY     and BSO, dx- dysfunctional bleeding   ANTERIOR CERVICAL DECOMP/DISCECTOMY FUSION  10/3-10/2010   Dr Pamala Hurry , [place] , [facility]    CESAREAN SECTION     x 4   COLONOSCOPY     Dr Deatra Ina   ENTEROSCOPY N/A 01/05/2020   Procedure: ENTEROSCOPY;  Surgeon: Doran Stabler, MD;  Location: [facility];  Service: Gastroenterology;  Laterality: N/A;   HOT HEMOSTASIS N/A 01/05/2020   Procedure: HOT HEMOSTASIS (ARGON PLASMA COAGULATION/BICAP);  Surgeon: Doran Stabler, MD;  Location: [facility];  Service: Gastroenterology;  Laterality: N/A;   LAPAROSCOPIC SMALL BOWEL RESECTION     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 She has never used smokeless tobacco. She reports that she does not drink alcohol and does not use drugs.  ALLERGIES:  Allergies  Allergen Reactions   Niacin     rash   Clarithromycin Other (See Comments)    unknown reaction   Neomycin-Bacitracin-Polymyxin  [Bacitracin-Neomycin-Polymyxin]    Neomycin Rash    ROS: Pertinent items noted in HPI and remainder of comprehensive ROS otherwise negative.  HOME MEDS: Current Outpatient Medications  Medication Sig Dispense Refill   acetaminophen (TYLENOL) 500 MG tablet Take 500-1,000 mg by mouth every 6 (six) hours as needed for mild pain or moderate pain.       amLODipine (NORVASC) 2.5 MG tablet Take 1 tablet (2.5 mg total) by mouth daily. 90 tablet 3   amLODipine (NORVASC) 5 MG tablet Take 5 mg by mouth daily.     aspirin EC 81 MG tablet Take 81 mg by mouth at bedtime.      atorvastatin (LIPITOR) 40 MG tablet TAKE 1 TABLET BY  MOUTH EVERY DAY 90 tablet 3   b complex vitamins tablet Take 1 tablet by mouth daily.     buPROPion (WELLBUTRIN XL) 150 MG 24 hr tablet Take 150 mg by mouth every morning.     Calcium Carbonate Antacid (CALCIUM CARBONATE PO) Take 1,000 mg by mouth daily.      Cholecalciferol (VITAMIN D3) 2000 UNITS capsule Take 1 capsule (2,000 Units total) by mouth daily. 100 capsule 3   Coenzyme Q10 (COQ10 PO) Take 20 mg by mouth daily at 12 noon.   0   Evolocumab (REPATHA SURECLICK) XX123456 MG/ML SOAJ Inject 1 Dose into the skin every 14 (fourteen) days. 2.1 mL 11   ferrous sulfate 325 (65 FE) MG tablet Take 325 mg by mouth daily with breakfast.     isosorbide mononitrate (IMDUR) 60 MG 24 hr tablet Take 1 tablet (60 mg total) by mouth daily. 30 tablet 6   MAGNESIUM PO Take 1 tablet by mouth daily.      metoprolol succinate (TOPROL-XL) 25 MG 24 hr tablet TAKE 1 TABLET BY MOUTH EVERY DAY 90 tablet 3   omega-3 fish oil (MAXEPA) 1000 MG CAPS capsule Take 3,000 mg by mouth daily.      pantoprazole (PROTONIX) 40 MG tablet Take 40 mg by mouth daily.     TURMERIC PO Take 1 capsule by mouth daily at 12 noon.     nitroGLYCERIN (NITROSTAT) 0.4 MG SL tablet Place 1 tablet (0.4 mg total) under the tongue every 5 (five) minutes as needed for chest pain. 25 tablet 3   No current facility-administered medications for this visit.    LABS/IMAGING: No results found for this or any previous visit (from the past 48 hour(s)). No results found.  WEIGHTS: Wt Readings from Last 3 Encounters:  02/18/21 140 lb 6.4 oz (63.7 kg)  01/21/21 140 lb 6.4 oz (63.7 kg)  01/01/21 139 lb 9.6 oz (63.3 kg)    VITALS: BP 122/68 (BP Location: Left Arm)   Pulse 63   Ht 5'  (1.524 m)   Wt 140 lb 6.4 oz (63.7 kg)   SpO2 97%   BMI 27.42 kg/m   EXAM: General appearance: alert and no distress Neck: no carotid bruit and no JVD Lungs: clear to auscultation bilaterally Heart: regular rate and rhythm, S1, S2 normal, no murmur, click, rub or gallop Abdomen: soft, non-tender; bowel sounds normal; no masses,  no organomegaly Extremities: Reproducible left upper chest and left upper scapular pain on palpation Pulses: 2+ and symmetric Skin: Skin color, texture, turgor normal. 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   Mrs. Recalde continues to be quite anxious about her coronaries.  Its not clear that she is describing anginal symptoms.  She is on long-acting nitrate.  She had anemia in the past however this improved.  She is responded well to the Repatha and atorvastatin with LDL cholesterol now down at 49.  Overall I think she is doing very well.  I tried to reassure her today we will plan to see her back in 6 months or sooner if necessary  Chrissie Noa C.  Debara Pickett, MD, [facility], [place] Director of the Advanced Lipid Disorders &  Cardiovascular Risk Reduction Clinic Diplomate of the American Board of Clinical Lipidology Attending Cardiologist  Direct Dial: [phone]  Fax: [phone]  Website:  [email or web address] 02/18/2021, 2:48 PM

## 2021-02-18 NOTE — Patient Instructions (Signed)
  Follow-Up: At [facility], you and your health needs are our priority.  As part of our continuing mission to provide you with exceptional heart care, we have created designated Provider Care Teams.  These Care Teams include your primary Cardiologist (physician) and Advanced Practice Providers (APPs -  Physician Assistants and Nurse Practitioners) who all work together to provide you with the care you need, when you need it.  We recommend signing up for the patient portal called "MyChart".  Sign up information is provided on this After Visit Summary.  MyChart is used to connect with patients for Virtual Visits (Telemedicine).  Patients are able to view lab/test results, encounter notes, upcoming appointments, etc.  Non-urgent messages can be sent to your provider as well.   To learn more about what you can do with MyChart, go to [email or web address].    Your next appointment:   6 month(s)  The format for your next appointment:   In Person  Provider:   K. Mali Hilty, MD

## 2021-02-19 ENCOUNTER — Encounter: Payer: Self-pay | Admitting: Podiatry

## 2021-02-19 ENCOUNTER — Ambulatory Visit (INDEPENDENT_AMBULATORY_CARE_PROVIDER_SITE_OTHER): Payer: Medicare Other | Admitting: Podiatry

## 2021-02-19 ENCOUNTER — Ambulatory Visit (INDEPENDENT_AMBULATORY_CARE_PROVIDER_SITE_OTHER): Payer: Medicare Other

## 2021-02-19 DIAGNOSIS — M216X9 Other acquired deformities of unspecified foot: Secondary | ICD-10-CM

## 2021-02-19 DIAGNOSIS — L84 Corns and callosities: Secondary | ICD-10-CM

## 2021-02-20 NOTE — Progress Notes (Signed)
Subjective:   Patient ID: Pamela Blair, female   DOB: 80 y.o.   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

## 2021-03-14 ENCOUNTER — Other Ambulatory Visit: Payer: Self-pay | Admitting: Medical

## 2021-04-29 DIAGNOSIS — I2510 Atherosclerotic heart disease of native coronary artery without angina pectoris: Secondary | ICD-10-CM | POA: Diagnosis not present

## 2021-04-29 DIAGNOSIS — R42 Dizziness and giddiness: Secondary | ICD-10-CM | POA: Diagnosis not present

## 2021-04-29 DIAGNOSIS — I959 Hypotension, unspecified: Secondary | ICD-10-CM | POA: Diagnosis not present

## 2021-04-29 DIAGNOSIS — E785 Hyperlipidemia, unspecified: Secondary | ICD-10-CM | POA: Diagnosis not present

## 2021-04-30 DIAGNOSIS — I2510 Atherosclerotic heart disease of native coronary artery without angina pectoris: Secondary | ICD-10-CM | POA: Diagnosis not present

## 2021-05-05 DIAGNOSIS — M75121 Complete rotator cuff tear or rupture of right shoulder, not specified as traumatic: Secondary | ICD-10-CM | POA: Diagnosis not present

## 2021-05-06 DIAGNOSIS — J209 Acute bronchitis, unspecified: Secondary | ICD-10-CM | POA: Diagnosis not present

## 2021-05-06 DIAGNOSIS — Z23 Encounter for immunization: Secondary | ICD-10-CM | POA: Diagnosis not present

## 2021-05-06 DIAGNOSIS — E785 Hyperlipidemia, unspecified: Secondary | ICD-10-CM | POA: Diagnosis not present

## 2021-05-08 DIAGNOSIS — E785 Hyperlipidemia, unspecified: Secondary | ICD-10-CM | POA: Diagnosis not present

## 2021-05-08 DIAGNOSIS — M810 Age-related osteoporosis without current pathological fracture: Secondary | ICD-10-CM | POA: Diagnosis not present

## 2021-05-08 DIAGNOSIS — I10 Essential (primary) hypertension: Secondary | ICD-10-CM | POA: Diagnosis not present

## 2021-05-14 DIAGNOSIS — R030 Elevated blood-pressure reading, without diagnosis of hypertension: Secondary | ICD-10-CM | POA: Diagnosis not present

## 2021-05-14 DIAGNOSIS — I700 Atherosclerosis of aorta: Secondary | ICD-10-CM | POA: Diagnosis not present

## 2021-05-14 DIAGNOSIS — G5600 Carpal tunnel syndrome, unspecified upper limb: Secondary | ICD-10-CM | POA: Diagnosis not present

## 2021-05-14 DIAGNOSIS — M791 Myalgia, unspecified site: Secondary | ICD-10-CM | POA: Diagnosis not present

## 2021-05-14 DIAGNOSIS — R011 Cardiac murmur, unspecified: Secondary | ICD-10-CM | POA: Diagnosis not present

## 2021-05-14 DIAGNOSIS — I10 Essential (primary) hypertension: Secondary | ICD-10-CM | POA: Diagnosis not present

## 2021-05-14 DIAGNOSIS — R82998 Other abnormal findings in urine: Secondary | ICD-10-CM | POA: Diagnosis not present

## 2021-05-14 DIAGNOSIS — Z1331 Encounter for screening for depression: Secondary | ICD-10-CM | POA: Diagnosis not present

## 2021-05-14 DIAGNOSIS — Z0000 Encounter for general adult medical examination without abnormal findings: Secondary | ICD-10-CM | POA: Diagnosis not present

## 2021-05-14 DIAGNOSIS — I2510 Atherosclerotic heart disease of native coronary artery without angina pectoris: Secondary | ICD-10-CM | POA: Diagnosis not present

## 2021-05-14 DIAGNOSIS — Z23 Encounter for immunization: Secondary | ICD-10-CM | POA: Diagnosis not present

## 2021-05-14 DIAGNOSIS — I6529 Occlusion and stenosis of unspecified carotid artery: Secondary | ICD-10-CM | POA: Diagnosis not present

## 2021-05-14 DIAGNOSIS — Z1339 Encounter for screening examination for other mental health and behavioral disorders: Secondary | ICD-10-CM | POA: Diagnosis not present

## 2021-05-14 DIAGNOSIS — E785 Hyperlipidemia, unspecified: Secondary | ICD-10-CM | POA: Diagnosis not present

## 2021-05-14 DIAGNOSIS — M810 Age-related osteoporosis without current pathological fracture: Secondary | ICD-10-CM | POA: Diagnosis not present

## 2021-05-19 DIAGNOSIS — J209 Acute bronchitis, unspecified: Secondary | ICD-10-CM | POA: Diagnosis not present

## 2021-05-21 ENCOUNTER — Ambulatory Visit: Payer: Medicare Other | Admitting: Podiatry

## 2021-05-22 DIAGNOSIS — J029 Acute pharyngitis, unspecified: Secondary | ICD-10-CM | POA: Diagnosis not present

## 2021-05-22 DIAGNOSIS — I2510 Atherosclerotic heart disease of native coronary artery without angina pectoris: Secondary | ICD-10-CM | POA: Diagnosis not present

## 2021-05-22 DIAGNOSIS — R059 Cough, unspecified: Secondary | ICD-10-CM | POA: Diagnosis not present

## 2021-05-22 DIAGNOSIS — Z1152 Encounter for screening for COVID-19: Secondary | ICD-10-CM | POA: Diagnosis not present

## 2021-05-22 DIAGNOSIS — G589 Mononeuropathy, unspecified: Secondary | ICD-10-CM | POA: Diagnosis not present

## 2021-05-22 DIAGNOSIS — R0981 Nasal congestion: Secondary | ICD-10-CM | POA: Diagnosis not present

## 2021-05-22 DIAGNOSIS — I10 Essential (primary) hypertension: Secondary | ICD-10-CM | POA: Diagnosis not present

## 2021-05-22 DIAGNOSIS — J069 Acute upper respiratory infection, unspecified: Secondary | ICD-10-CM | POA: Diagnosis not present

## 2021-05-28 ENCOUNTER — Ambulatory Visit: Payer: Medicare Other | Admitting: Podiatry

## 2021-06-04 ENCOUNTER — Ambulatory Visit: Payer: Medicare Other | Admitting: Podiatry

## 2021-07-02 DIAGNOSIS — R110 Nausea: Secondary | ICD-10-CM | POA: Diagnosis not present

## 2021-07-02 DIAGNOSIS — J029 Acute pharyngitis, unspecified: Secondary | ICD-10-CM | POA: Diagnosis not present

## 2021-07-02 DIAGNOSIS — R0981 Nasal congestion: Secondary | ICD-10-CM | POA: Diagnosis not present

## 2021-07-02 DIAGNOSIS — R0602 Shortness of breath: Secondary | ICD-10-CM | POA: Diagnosis not present

## 2021-07-02 DIAGNOSIS — J069 Acute upper respiratory infection, unspecified: Secondary | ICD-10-CM | POA: Diagnosis not present

## 2021-07-02 DIAGNOSIS — R051 Acute cough: Secondary | ICD-10-CM | POA: Diagnosis not present

## 2021-07-02 DIAGNOSIS — Z1152 Encounter for screening for COVID-19: Secondary | ICD-10-CM | POA: Diagnosis not present

## 2021-07-11 DIAGNOSIS — I2510 Atherosclerotic heart disease of native coronary artery without angina pectoris: Secondary | ICD-10-CM | POA: Diagnosis not present

## 2021-07-11 DIAGNOSIS — M545 Low back pain, unspecified: Secondary | ICD-10-CM | POA: Diagnosis not present

## 2021-07-11 DIAGNOSIS — M25511 Pain in right shoulder: Secondary | ICD-10-CM | POA: Diagnosis not present

## 2021-07-11 DIAGNOSIS — R509 Fever, unspecified: Secondary | ICD-10-CM | POA: Diagnosis not present

## 2021-07-11 DIAGNOSIS — G589 Mononeuropathy, unspecified: Secondary | ICD-10-CM | POA: Diagnosis not present

## 2021-07-11 DIAGNOSIS — E785 Hyperlipidemia, unspecified: Secondary | ICD-10-CM | POA: Diagnosis not present

## 2021-07-11 DIAGNOSIS — R058 Other specified cough: Secondary | ICD-10-CM | POA: Diagnosis not present

## 2021-07-11 DIAGNOSIS — I10 Essential (primary) hypertension: Secondary | ICD-10-CM | POA: Diagnosis not present

## 2021-07-11 DIAGNOSIS — Z1152 Encounter for screening for COVID-19: Secondary | ICD-10-CM | POA: Diagnosis not present

## 2021-07-11 DIAGNOSIS — M1990 Unspecified osteoarthritis, unspecified site: Secondary | ICD-10-CM | POA: Diagnosis not present

## 2021-07-11 DIAGNOSIS — M25512 Pain in left shoulder: Secondary | ICD-10-CM | POA: Diagnosis not present

## 2021-07-14 ENCOUNTER — Encounter: Payer: Self-pay | Admitting: Podiatry

## 2021-07-14 ENCOUNTER — Other Ambulatory Visit: Payer: Self-pay

## 2021-07-14 ENCOUNTER — Ambulatory Visit (INDEPENDENT_AMBULATORY_CARE_PROVIDER_SITE_OTHER): Payer: Medicare Other | Admitting: Podiatry

## 2021-07-14 DIAGNOSIS — L84 Corns and callosities: Secondary | ICD-10-CM

## 2021-07-14 DIAGNOSIS — M216X9 Other acquired deformities of unspecified foot: Secondary | ICD-10-CM

## 2021-07-14 NOTE — Progress Notes (Signed)
Subjective:   Patient ID: Pamela Blair, female   DOB: 81 y.o.   MRN: [id number]   HPI Patient presents chronic pain in the plantar right foot and is wondering what we may be able to do with this long-term.  States its hard to walk on at times   ROS      Objective:  Physical Exam  Neurovascular status intact with prominent bone structure lesser metatarsals right especially second and third with lesions that formed secondary to the pressure against this area     Assessment:  Chronic structural deformity with lesion formation secondary to pressure     Plan:  H&P reviewed condition deep debridement is accomplished today and discussed elevating osteotomies that where we may need to go at 1 point in the future.  We will continue to evaluate and to decide what will be appropriate for

## 2021-07-23 DIAGNOSIS — M0230 Reiter's disease, unspecified site: Secondary | ICD-10-CM | POA: Diagnosis not present

## 2021-07-23 DIAGNOSIS — M810 Age-related osteoporosis without current pathological fracture: Secondary | ICD-10-CM | POA: Diagnosis not present

## 2021-07-23 DIAGNOSIS — M1990 Unspecified osteoarthritis, unspecified site: Secondary | ICD-10-CM | POA: Diagnosis not present

## 2021-07-23 DIAGNOSIS — M25511 Pain in right shoulder: Secondary | ICD-10-CM | POA: Diagnosis not present

## 2021-07-23 DIAGNOSIS — R700 Elevated erythrocyte sedimentation rate: Secondary | ICD-10-CM | POA: Diagnosis not present

## 2021-08-27 ENCOUNTER — Other Ambulatory Visit (HOSPITAL_COMMUNITY): Payer: Self-pay | Admitting: *Deleted

## 2021-08-28 ENCOUNTER — Other Ambulatory Visit: Payer: Self-pay

## 2021-08-28 ENCOUNTER — Ambulatory Visit (HOSPITAL_COMMUNITY)
Admission: RE | Admit: 2021-08-28 | Discharge: 2021-08-28 | Disposition: A | Discharge status: Home / Self Care | Payer: Medicare Other | Source: Ambulatory Visit | Attending: Internal Medicine | Admitting: Internal Medicine

## 2021-08-28 DIAGNOSIS — M810 Age-related osteoporosis without current pathological fracture: Secondary | ICD-10-CM | POA: Insufficient documentation

## 2021-08-28 MED ORDER — DENOSUMAB 60 MG/ML ~~LOC~~ SOSY
60.0000 mg | PREFILLED_SYRINGE | Freq: Once | SUBCUTANEOUS | Status: AC
  Administered 2021-08-28: 60 mg via SUBCUTANEOUS

## 2021-08-28 MED ORDER — DENOSUMAB 60 MG/ML ~~LOC~~ SOSY
PREFILLED_SYRINGE | SUBCUTANEOUS | Status: AC
  Filled 2021-08-28: qty 1

## 2021-09-05 ENCOUNTER — Ambulatory Visit: Payer: Medicare Other | Admitting: Internal Medicine

## 2021-09-09 DIAGNOSIS — I2510 Atherosclerotic heart disease of native coronary artery without angina pectoris: Secondary | ICD-10-CM | POA: Diagnosis not present

## 2021-09-09 DIAGNOSIS — G5601 Carpal tunnel syndrome, right upper limb: Secondary | ICD-10-CM | POA: Diagnosis not present

## 2021-09-09 DIAGNOSIS — R011 Cardiac murmur, unspecified: Secondary | ICD-10-CM | POA: Diagnosis not present

## 2021-09-09 DIAGNOSIS — E785 Hyperlipidemia, unspecified: Secondary | ICD-10-CM | POA: Diagnosis not present

## 2021-09-09 DIAGNOSIS — R9431 Abnormal electrocardiogram [ECG] [EKG]: Secondary | ICD-10-CM | POA: Diagnosis not present

## 2021-09-16 DIAGNOSIS — M19011 Primary osteoarthritis, right shoulder: Secondary | ICD-10-CM | POA: Diagnosis not present

## 2021-09-16 DIAGNOSIS — G5601 Carpal tunnel syndrome, right upper limb: Secondary | ICD-10-CM | POA: Diagnosis not present

## 2021-09-16 DIAGNOSIS — S46191A Other injury of muscle, fascia and tendon of long head of biceps, right arm, initial encounter: Secondary | ICD-10-CM | POA: Diagnosis not present

## 2021-09-16 DIAGNOSIS — S43431A Superior glenoid labrum lesion of right shoulder, initial encounter: Secondary | ICD-10-CM | POA: Diagnosis not present

## 2021-09-16 DIAGNOSIS — S46111A Strain of muscle, fascia and tendon of long head of biceps, right arm, initial encounter: Secondary | ICD-10-CM | POA: Diagnosis not present

## 2021-09-16 DIAGNOSIS — Y999 Unspecified external cause status: Secondary | ICD-10-CM | POA: Diagnosis not present

## 2021-09-16 DIAGNOSIS — X58XXXA Exposure to other specified factors, initial encounter: Secondary | ICD-10-CM | POA: Diagnosis not present

## 2021-09-16 DIAGNOSIS — M7541 Impingement syndrome of right shoulder: Secondary | ICD-10-CM | POA: Diagnosis not present

## 2021-09-16 DIAGNOSIS — G8918 Other acute postprocedural pain: Secondary | ICD-10-CM | POA: Diagnosis not present

## 2021-09-16 DIAGNOSIS — M7551 Bursitis of right shoulder: Secondary | ICD-10-CM | POA: Diagnosis not present

## 2021-09-16 DIAGNOSIS — M75121 Complete rotator cuff tear or rupture of right shoulder, not specified as traumatic: Secondary | ICD-10-CM | POA: Diagnosis not present

## 2021-09-17 ENCOUNTER — Encounter: Payer: Self-pay | Admitting: Internal Medicine

## 2021-09-20 ENCOUNTER — Emergency Department (HOSPITAL_BASED_OUTPATIENT_CLINIC_OR_DEPARTMENT_OTHER)
Admission: EM | Admit: 2021-09-20 | Discharge: 2021-09-20 | Disposition: A | Discharge status: Home / Self Care | Payer: Medicare Other | Attending: Emergency Medicine | Admitting: Emergency Medicine

## 2021-09-20 ENCOUNTER — Other Ambulatory Visit: Payer: Self-pay

## 2021-09-20 ENCOUNTER — Encounter (HOSPITAL_BASED_OUTPATIENT_CLINIC_OR_DEPARTMENT_OTHER): Payer: Self-pay | Admitting: Emergency Medicine

## 2021-09-20 ENCOUNTER — Emergency Department (HOSPITAL_BASED_OUTPATIENT_CLINIC_OR_DEPARTMENT_OTHER): Payer: Medicare Other

## 2021-09-20 DIAGNOSIS — K5903 Drug induced constipation: Secondary | ICD-10-CM

## 2021-09-20 DIAGNOSIS — K5900 Constipation, unspecified: Secondary | ICD-10-CM | POA: Diagnosis not present

## 2021-09-20 DIAGNOSIS — I700 Atherosclerosis of aorta: Secondary | ICD-10-CM | POA: Diagnosis not present

## 2021-09-20 DIAGNOSIS — Z7982 Long term (current) use of aspirin: Secondary | ICD-10-CM | POA: Diagnosis not present

## 2021-09-20 DIAGNOSIS — R9431 Abnormal electrocardiogram [ECG] [EKG]: Secondary | ICD-10-CM | POA: Diagnosis not present

## 2021-09-20 DIAGNOSIS — R109 Unspecified abdominal pain: Secondary | ICD-10-CM | POA: Diagnosis not present

## 2021-09-20 LAB — CBC WITH DIFFERENTIAL/PLATELET
Abs Immature Granulocytes: 0.06 10*3/uL (ref 0.00–0.07)
Basophils Absolute: 0 10*3/uL (ref 0.0–0.1)
Basophils Relative: 0 %
Eosinophils Absolute: 0.1 10*3/uL (ref 0.0–0.5)
Eosinophils Relative: 1 %
HCT: 38.2 % (ref 36.0–46.0)
Hemoglobin: 12.4 g/dL (ref 12.0–15.0)
Immature Granulocytes: 1 %
Lymphocytes Relative: 19 %
Lymphs Abs: 1.9 10*3/uL (ref 0.7–4.0)
MCH: 31.1 pg (ref 26.0–34.0)
MCHC: 32.5 g/dL (ref 30.0–36.0)
MCV: 95.7 fL (ref 80.0–100.0)
Monocytes Absolute: 0.5 10*3/uL (ref 0.1–1.0)
Monocytes Relative: 5 %
Neutro Abs: 7.6 10*3/uL (ref 1.7–7.7)
Neutrophils Relative %: 74 %
Platelets: 201 10*3/uL (ref 150–400)
RBC: 3.99 MIL/uL (ref 3.87–5.11)
RDW: 15.2 % (ref 11.5–15.5)
WBC: 10.2 10*3/uL (ref 4.0–10.5)
nRBC: 0 % (ref 0.0–0.2)

## 2021-09-20 LAB — COMPREHENSIVE METABOLIC PANEL
ALT: 18 U/L (ref 0–44)
AST: 19 U/L (ref 15–41)
Albumin: 4 g/dL (ref 3.5–5.0)
Alkaline Phosphatase: 33 U/L — ABNORMAL LOW (ref 38–126)
Anion gap: 10 (ref 5–15)
BUN: 11 mg/dL (ref 8–23)
CO2: 23 mmol/L (ref 22–32)
Calcium: 9.2 mg/dL (ref 8.9–10.3)
Chloride: 103 mmol/L (ref 98–111)
Creatinine, Ser: 0.61 mg/dL (ref 0.44–1.00)
GFR, Estimated: >60 mL/min (ref >60)
Glucose, Bld: 152 mg/dL — ABNORMAL HIGH (ref 70–99)
Potassium: 3.7 mmol/L (ref 3.5–5.1)
Sodium: 136 mmol/L (ref 135–145)
Total Bilirubin: 0.8 mg/dL (ref 0.3–1.2)
Total Protein: 6.9 g/dL (ref 6.5–8.1)

## 2021-09-20 LAB — URINALYSIS, ROUTINE W REFLEX MICROSCOPIC
Bilirubin Urine: NEGATIVE
Glucose, UA: NEGATIVE mg/dL
Hgb urine dipstick: NEGATIVE
Ketones, ur: NEGATIVE mg/dL
Leukocytes,Ua: NEGATIVE
Nitrite: NEGATIVE
Protein, ur: NEGATIVE mg/dL
Specific Gravity, Urine: 1.025 (ref 1.005–1.030)
pH: 6.5 (ref 5.0–8.0)

## 2021-09-20 MED ORDER — FLEET ENEMA 7-19 GM/118ML RE ENEM
1.0000 | ENEMA | Freq: Once | RECTAL | Status: AC
  Administered 2021-09-20: 1 via RECTAL
  Filled 2021-09-20: qty 1

## 2021-09-20 MED ORDER — SODIUM CHLORIDE 0.9 % IV BOLUS
500.0000 mL | Freq: Once | INTRAVENOUS | Status: AC
  Administered 2021-09-20: 500 mL via INTRAVENOUS

## 2021-09-20 MED ORDER — ONDANSETRON 4 MG PO TBDP
4.0000 mg | ORAL_TABLET | Freq: Once | ORAL | Status: AC
  Administered 2021-09-20: 4 mg via ORAL
  Filled 2021-09-20: qty 1

## 2021-09-20 MED ORDER — ACETAMINOPHEN 500 MG PO TABS
1000.0000 mg | ORAL_TABLET | Freq: Once | ORAL | Status: AC
  Administered 2021-09-20: 1000 mg via ORAL
  Filled 2021-09-20: qty 2

## 2021-09-20 MED ORDER — IOHEXOL 300 MG/ML  SOLN
100.0000 mL | Freq: Once | INTRAMUSCULAR | Status: AC | PRN
  Administered 2021-09-20: 100 mL via INTRAVENOUS

## 2021-09-20 NOTE — Discharge Instructions (Addendum)
Miralax, 1 capful twice daily.  Also, Sennokot twice daily while taking opioids.  Contact your surgeon regarding your pain medication ?

## 2021-09-20 NOTE — ED Triage Notes (Signed)
Pt presents status post rotator cuff sx with c/o constipation x3 days. Has tried hot tea, senna at home. ?

## 2021-09-20 NOTE — ED Provider Notes (Signed)
?[facility] ?Provider Note ? ? ?CSN: [id number] ?Arrival date & time: 09/20/21  0107 ? ?  ? ?History ? ?No chief complaint on file. ? ? ?Pamela Blair is a 81 y.o. female. ? ?The history is provided by the patient and a relative.  ?Constipation ?Severity:  Moderate ?Time since last bowel movement:  3 days ?Timing:  Constant ?Progression:  Unchanged ?Chronicity:  New ?Context: narcotics   ?Context comment:  S/p anesthesia for rotator cuff surgery ?Relieved by:  Nothing ?Worsened by:  Narcotic pain medications ?Ineffective treatments: sennokot yesterday. ?Associated symptoms: no diarrhea and no fever   ?Risk factors: recent surgery   ?Patient who is POD 3 from rotator cuff surgery presents with constipation.  Patient had anesthesia and is on narcotic pain medication as well as a muscle relaxant.   ?  ?Past Medical History:  ?Diagnosis Date  ? Blood transfusion without reported diagnosis   ? Cervical spinal stenosis   ? Cervical spondylosis   ? Hyperlipidemia   ? Osteoporosis   ? ? ?Home Medications ?Prior to Admission medications   ?Medication Sig Start Date End Date Taking? Authorizing Provider  ?acetaminophen (TYLENOL) 500 MG tablet Take 500-1,000 mg by mouth every 6 (six) hours as needed for mild pain or moderate pain.     [provider]  ?amLODipine (NORVASC) 2.5 MG tablet Take 1 tablet (2.5 mg total) by mouth daily. 02/22/20 02/21/21  Pixie Casino, MD  ?amLODipine (NORVASC) 5 MG tablet Take 5 mg by mouth daily. 12/20/19   [provider]  ?aspirin EC 81 MG tablet Take 81 mg by mouth at bedtime.  11/30/19   Kroeger, Lorelee Cover., PA-C  ?atorvastatin (LIPITOR) 40 MG tablet TAKE 1 TABLET BY MOUTH EVERY DAY 12/06/20   Hilty, Nadean Corwin, MD  ?b complex vitamins tablet Take 1 tablet by mouth daily.    [provider]  ?buPROPion (WELLBUTRIN XL) 150 MG 24 hr tablet Take 150 mg by mouth every morning. 12/18/19   [provider]  ?Calcium Carbonate Antacid  (CALCIUM CARBONATE PO) Take 1,000 mg by mouth daily.     [provider]  ?Cholecalciferol (VITAMIN D3) 2000 UNITS capsule Take 1 capsule (2,000 Units total) by mouth daily. 07/02/15   Plotnikov, Evie Lacks, MD  ?Coenzyme Q10 (COQ10 PO) Take 20 mg by mouth daily at 12 noon.  11/30/19   Kroeger, Lorelee Cover., PA-C  ?Evolocumab (REPATHA SURECLICK) 956 MG/ML SOAJ Inject 1 Dose into the skin every 14 (fourteen) days. 03/01/20   Hilty, Nadean Corwin, MD  ?ferrous sulfate 325 (65 FE) MG tablet Take 325 mg by mouth daily with breakfast.    [provider]  ?isosorbide mononitrate (IMDUR) 60 MG 24 hr tablet Take 1 tablet (60 mg total) by mouth daily. 02/12/21 05/13/21  Pixie Casino, MD  ?MAGNESIUM PO Take 1 tablet by mouth daily.     [provider]  ?metoprolol succinate (TOPROL-XL) 25 MG 24 hr tablet TAKE 1 TABLET BY MOUTH EVERY DAY 03/17/21   Kroeger, Lorelee Cover., PA-C  ?nitroGLYCERIN (NITROSTAT) 0.4 MG SL tablet Place 1 tablet (0.4 mg total) under the tongue every 5 (five) minutes as needed for chest pain. 11/30/19 01/01/21  Kroeger, Lorelee Cover., PA-C  ?omega-3 fish oil (MAXEPA) 1000 MG CAPS capsule Take 3,000 mg by mouth daily.     [provider]  ?pantoprazole (PROTONIX) 40 MG tablet Take 40 mg by mouth daily. 12/20/19   [provider]  ?TURMERIC PO Take  1 capsule by mouth daily at 12 noon.    [provider]  ?   ? ?Allergies    ?Niacin, Clarithromycin, Neomycin-bacitracin-polymyxin  [bacitracin-neomycin-polymyxin], and Neomycin   ? ?Review of Systems   ?Review of Systems  ?Constitutional:  Negative for fever.  ?HENT:  Negative for facial swelling.   ?Eyes:  Negative for redness.  ?Respiratory:  Negative for wheezing and stridor.   ?Gastrointestinal:  Positive for constipation. Negative for diarrhea.  ?All other systems reviewed and are negative. ? ?Physical Exam ?Updated Vital Signs ?BP 133/78 (BP Location: Left Arm)   Pulse 75   Temp 98.7 ?F (37.1 ?C) (Oral)   Resp (!) 21    SpO2 98%  ?Physical Exam ?Vitals and nursing note reviewed. Exam conducted with a chaperone present.  ?Constitutional:   ?   Appearance: Normal appearance. She is not diaphoretic.  ?HENT:  ?   Head: Normocephalic and atraumatic.  ?   Nose: Nose normal.  ?Eyes:  ?   Conjunctiva/sclera: Conjunctivae normal.  ?   Pupils: Pupils are equal, round, and reactive to light.  ?Cardiovascular:  ?   Rate and Rhythm: Normal rate and regular rhythm.  ?   Pulses: Normal pulses.  ?   Heart sounds: Normal heart sounds.  ?Pulmonary:  ?   Effort: Pulmonary effort is normal.  ?   Breath sounds: Normal breath sounds.  ?Abdominal:  ?   General: Bowel sounds are normal.  ?   Palpations: Abdomen is soft.  ?   Tenderness: There is no abdominal tenderness. There is no guarding or rebound.  ?   Hernia: No hernia is present.  ?Musculoskeletal:  ?   Cervical back: Normal range of motion and neck supple.  ?   Right lower leg: No edema.  ?   Left lower leg: No edema.  ?Skin: ?   General: Skin is warm and dry.  ?   Capillary Refill: Capillary refill takes less than 2 seconds.  ?Neurological:  ?   General: No focal deficit present.  ?   Mental Status: She is alert.  ?   Deep Tendon Reflexes: Reflexes normal.  ?Psychiatric:     ?   Mood and Affect: Mood normal.     ?   Behavior: Behavior normal.  ? ? ?ED Results / Procedures / Treatments   ?Labs ?(all labs ordered are listed, but only abnormal results are displayed) ?Results for orders placed or performed during the hospital encounter of 09/20/21  ?CBC with Differential/Platelet  ?Result Value Ref Range  ? WBC 10.2 4.0 - 10.5 K/uL  ? RBC 3.99 3.87 - 5.11 MIL/uL  ? Hemoglobin 12.4 12.0 - 15.0 g/dL  ? HCT 38.2 36.0 - 46.0 %  ? MCV 95.7 80.0 - 100.0 fL  ? MCH 31.1 26.0 - 34.0 pg  ? MCHC 32.5 30.0 - 36.0 g/dL  ? RDW 15.2 11.5 - 15.5 %  ? Platelets 201 150 - 400 K/uL  ? nRBC 0.0 0.0 - 0.2 %  ? Neutrophils Relative % 74 %  ? Neutro Abs 7.6 1.7 - 7.7 K/uL  ? Lymphocytes Relative 19 %  ? Lymphs Abs 1.9  0.7 - 4.0 K/uL  ? Monocytes Relative 5 %  ? Monocytes Absolute 0.5 0.1 - 1.0 K/uL  ? Eosinophils Relative 1 %  ? Eosinophils Absolute 0.1 0.0 - 0.5 K/uL  ? Basophils Relative 0 %  ? Basophils Absolute 0.0 0.0 - 0.1 K/uL  ? Immature Granulocytes 1 %  ?  Abs Immature Granulocytes 0.06 0.00 - 0.07 K/uL  ?Comprehensive metabolic panel  ?Result Value Ref Range  ? Sodium 136 135 - 145 mmol/L  ? Potassium 3.7 3.5 - 5.1 mmol/L  ? Chloride 103 98 - 111 mmol/L  ? CO2 23 22 - 32 mmol/L  ? Glucose, Bld 152 (H) 70 - 99 mg/dL  ? BUN 11 8 - 23 mg/dL  ? Creatinine, Ser 0.61 0.44 - 1.00 mg/dL  ? Calcium 9.2 8.9 - 10.3 mg/dL  ? Total Protein 6.9 6.5 - 8.1 g/dL  ? Albumin 4.0 3.5 - 5.0 g/dL  ? AST 19 15 - 41 U/L  ? ALT 18 0 - 44 U/L  ? Alkaline Phosphatase 33 (L) 38 - 126 U/L  ? Total Bilirubin 0.8 0.3 - 1.2 mg/dL  ? GFR, Estimated >60 >60 mL/min  ? Anion gap 10 5 - 15  ? ?CT ABDOMEN PELVIS W CONTRAST ? ?Result Date: 09/20/2021 ?CLINICAL DATA:  Abdominal pain, acute, nonlocalized. EXAM: CT ABDOMEN AND PELVIS WITH CONTRAST TECHNIQUE: Multidetector CT imaging of the abdomen and pelvis was performed using the standard protocol following bolus administration of intravenous contrast. RADIATION DOSE REDUCTION: This exam was performed according to the departmental dose-optimization program which includes automated exposure control, adjustment of the mA and/or kV according to patient size and/or use of iterative reconstruction technique. CONTRAST:  164m OMNIPAQUE IOHEXOL 300 MG/ML  SOLN COMPARISON:  09/12/2010 FINDINGS: Lower chest: No acute abnormality. Hepatobiliary: No focal liver abnormality is seen. No gallstones, gallbladder wall thickening, or biliary dilatation. Pancreas: Unremarkable Spleen: Unremarkable Adrenals/Urinary Tract: Adrenal glands are unremarkable. Kidneys are normal, without renal calculi, focal lesion, or hydronephrosis. Bladder is unremarkable. Stomach/Bowel: Moderate stool within the distal colon without evidence of  obstruction. The stomach, small bowel, and large bowel are otherwise unremarkable. Appendix absent. No free intraperitoneal gas or fluid. Vascular/Lymphatic: Aortic atherosclerosis. No enlarged abdominal or pelvic

## 2021-09-29 ENCOUNTER — Telehealth: Payer: Self-pay | Admitting: Internal Medicine

## 2021-09-29 DIAGNOSIS — Z4789 Encounter for other orthopedic aftercare: Secondary | ICD-10-CM | POA: Diagnosis not present

## 2021-09-29 DIAGNOSIS — M25531 Pain in right wrist: Secondary | ICD-10-CM | POA: Diagnosis not present

## 2021-09-29 NOTE — Telephone Encounter (Deleted)
After speaking with pt, I passed on her concern for leadership to review. (Reference other note put in the chart 09/29/2021) ?

## 2021-09-29 NOTE — Telephone Encounter (Addendum)
Pt and son came into the [facility] office, after receiving a letter in the mail about her no showing her appointment for Dr.Hilty on 09/05/2021. Pt stated she called in and spoke with someone to let them know to cancel the appointment. Pt doesn't remember who they spoke with just that they called in. Didn't see any documentation on appointment being changed. Pt didn't want to reschedule either, she will call back when she is ready for her next appointment. After speaking with pt, I passed on her concern for leadership to review.  ?

## 2021-10-02 DIAGNOSIS — I081 Rheumatic disorders of both mitral and tricuspid valves: Secondary | ICD-10-CM | POA: Diagnosis not present

## 2021-10-05 ENCOUNTER — Other Ambulatory Visit: Payer: Self-pay | Admitting: Internal Medicine

## 2021-10-13 DIAGNOSIS — M25531 Pain in right wrist: Secondary | ICD-10-CM | POA: Diagnosis not present

## 2021-11-03 DIAGNOSIS — M25511 Pain in right shoulder: Secondary | ICD-10-CM | POA: Diagnosis not present

## 2021-11-05 DIAGNOSIS — M25511 Pain in right shoulder: Secondary | ICD-10-CM | POA: Diagnosis not present

## 2021-11-13 DIAGNOSIS — M25511 Pain in right shoulder: Secondary | ICD-10-CM | POA: Diagnosis not present

## 2021-11-19 DIAGNOSIS — J029 Acute pharyngitis, unspecified: Secondary | ICD-10-CM | POA: Diagnosis not present

## 2021-11-19 DIAGNOSIS — I2510 Atherosclerotic heart disease of native coronary artery without angina pectoris: Secondary | ICD-10-CM | POA: Diagnosis not present

## 2021-11-19 DIAGNOSIS — B349 Viral infection, unspecified: Secondary | ICD-10-CM | POA: Diagnosis not present

## 2021-11-19 DIAGNOSIS — I10 Essential (primary) hypertension: Secondary | ICD-10-CM | POA: Diagnosis not present

## 2021-11-19 DIAGNOSIS — R011 Cardiac murmur, unspecified: Secondary | ICD-10-CM | POA: Diagnosis not present

## 2021-11-25 DIAGNOSIS — M25511 Pain in right shoulder: Secondary | ICD-10-CM | POA: Diagnosis not present

## 2021-11-28 DIAGNOSIS — M25511 Pain in right shoulder: Secondary | ICD-10-CM | POA: Diagnosis not present

## 2021-12-02 DIAGNOSIS — M25511 Pain in right shoulder: Secondary | ICD-10-CM | POA: Diagnosis not present

## 2021-12-08 ENCOUNTER — Other Ambulatory Visit: Payer: Self-pay | Admitting: Internal Medicine

## 2021-12-17 ENCOUNTER — Other Ambulatory Visit: Payer: Self-pay | Admitting: General Practice

## 2021-12-17 ENCOUNTER — Other Ambulatory Visit: Payer: Self-pay | Admitting: Internal Medicine

## 2021-12-17 DIAGNOSIS — M25511 Pain in right shoulder: Secondary | ICD-10-CM | POA: Diagnosis not present

## 2022-02-03 DIAGNOSIS — I340 Nonrheumatic mitral (valve) insufficiency: Secondary | ICD-10-CM | POA: Diagnosis not present

## 2022-02-03 DIAGNOSIS — I2510 Atherosclerotic heart disease of native coronary artery without angina pectoris: Secondary | ICD-10-CM | POA: Diagnosis not present

## 2022-02-03 DIAGNOSIS — E785 Hyperlipidemia, unspecified: Secondary | ICD-10-CM | POA: Diagnosis not present

## 2022-03-25 DIAGNOSIS — Z4789 Encounter for other orthopedic aftercare: Secondary | ICD-10-CM | POA: Diagnosis not present

## 2022-04-10 DIAGNOSIS — R030 Elevated blood-pressure reading, without diagnosis of hypertension: Secondary | ICD-10-CM | POA: Diagnosis not present

## 2022-04-10 DIAGNOSIS — I2510 Atherosclerotic heart disease of native coronary artery without angina pectoris: Secondary | ICD-10-CM | POA: Diagnosis not present

## 2022-04-10 DIAGNOSIS — I6529 Occlusion and stenosis of unspecified carotid artery: Secondary | ICD-10-CM | POA: Diagnosis not present

## 2022-04-10 DIAGNOSIS — D320 Benign neoplasm of cerebral meninges: Secondary | ICD-10-CM | POA: Diagnosis not present

## 2022-04-10 DIAGNOSIS — R011 Cardiac murmur, unspecified: Secondary | ICD-10-CM | POA: Diagnosis not present

## 2022-04-10 DIAGNOSIS — I10 Essential (primary) hypertension: Secondary | ICD-10-CM | POA: Diagnosis not present

## 2022-04-10 DIAGNOSIS — M1990 Unspecified osteoarthritis, unspecified site: Secondary | ICD-10-CM | POA: Diagnosis not present

## 2022-04-10 DIAGNOSIS — E785 Hyperlipidemia, unspecified: Secondary | ICD-10-CM | POA: Diagnosis not present

## 2022-04-10 DIAGNOSIS — J029 Acute pharyngitis, unspecified: Secondary | ICD-10-CM | POA: Diagnosis not present

## 2022-04-10 DIAGNOSIS — F329 Major depressive disorder, single episode, unspecified: Secondary | ICD-10-CM | POA: Diagnosis not present

## 2022-04-10 DIAGNOSIS — G589 Mononeuropathy, unspecified: Secondary | ICD-10-CM | POA: Diagnosis not present

## 2022-04-10 DIAGNOSIS — I700 Atherosclerosis of aorta: Secondary | ICD-10-CM | POA: Diagnosis not present

## 2022-04-13 ENCOUNTER — Encounter (HOSPITAL_COMMUNITY): Payer: Medicare Other

## 2022-04-15 ENCOUNTER — Other Ambulatory Visit (HOSPITAL_COMMUNITY): Payer: Self-pay | Admitting: *Deleted

## 2022-04-15 DIAGNOSIS — E785 Hyperlipidemia, unspecified: Secondary | ICD-10-CM | POA: Diagnosis not present

## 2022-04-15 DIAGNOSIS — R531 Weakness: Secondary | ICD-10-CM | POA: Diagnosis not present

## 2022-04-15 DIAGNOSIS — D509 Iron deficiency anemia, unspecified: Secondary | ICD-10-CM | POA: Diagnosis not present

## 2022-04-15 DIAGNOSIS — M810 Age-related osteoporosis without current pathological fracture: Secondary | ICD-10-CM | POA: Diagnosis not present

## 2022-04-15 DIAGNOSIS — R7989 Other specified abnormal findings of blood chemistry: Secondary | ICD-10-CM | POA: Diagnosis not present

## 2022-04-15 DIAGNOSIS — I10 Essential (primary) hypertension: Secondary | ICD-10-CM | POA: Diagnosis not present

## 2022-04-15 DIAGNOSIS — M791 Myalgia, unspecified site: Secondary | ICD-10-CM | POA: Diagnosis not present

## 2022-04-16 ENCOUNTER — Telehealth: Payer: Self-pay | Admitting: Podiatry

## 2022-04-16 ENCOUNTER — Ambulatory Visit (HOSPITAL_COMMUNITY)
Admission: RE | Admit: 2022-04-16 | Discharge: 2022-04-16 | Disposition: A | Discharge status: Home / Self Care | Payer: Medicare Other | Source: Ambulatory Visit | Attending: Internal Medicine | Admitting: Internal Medicine

## 2022-04-16 DIAGNOSIS — M810 Age-related osteoporosis without current pathological fracture: Secondary | ICD-10-CM | POA: Insufficient documentation

## 2022-04-16 MED ORDER — DENOSUMAB 60 MG/ML ~~LOC~~ SOSY
PREFILLED_SYRINGE | SUBCUTANEOUS | Status: AC
Start: 2022-04-16 — End: 2022-04-16
  Administered 2022-04-16: 60 mg via SUBCUTANEOUS
  Filled 2022-04-16: qty 1

## 2022-04-16 MED ORDER — DENOSUMAB 60 MG/ML ~~LOC~~ SOSY: 60.0000 mg | PREFILLED_SYRINGE | Freq: Once | SUBCUTANEOUS | Status: AC

## 2022-04-16 NOTE — Telephone Encounter (Signed)
Pt left message on nurse line and it was transferred to me and she was asking if she could move her appt for today with Dr Paulla Dolly to earlier.  Upon checking pt is scheduled for 10.19 at 215 not today.   I called pt and explained her appt was not today but next Thursday @ 215 and she looked on her calendar and it was written down for that day and time and said ok.

## 2022-04-23 ENCOUNTER — Ambulatory Visit (INDEPENDENT_AMBULATORY_CARE_PROVIDER_SITE_OTHER): Payer: Medicare Other | Admitting: Podiatry

## 2022-04-23 ENCOUNTER — Encounter: Payer: Self-pay | Admitting: Podiatry

## 2022-04-23 DIAGNOSIS — L84 Corns and callosities: Secondary | ICD-10-CM | POA: Diagnosis not present

## 2022-04-24 NOTE — Progress Notes (Signed)
Subjective:   Patient ID: Pamela Blair, female   DOB: 81 y.o.   MRN: [id number]   HPI Patient presents with chronic lesions plantar aspect right foot that have been painful   ROS      Objective:  Physical Exam  Neurovascular status intact with 2 keratotic lesions plantar right painful when pressed which make shoe gear difficult     Assessment:  Chronic lesion formation x2 right     Plan:  Reviewed with patient foot structure and debrided 2 separate lesions right no iatrogenic bleeding reappoint routine care

## 2022-05-05 DIAGNOSIS — R0789 Other chest pain: Secondary | ICD-10-CM | POA: Diagnosis not present

## 2022-05-05 DIAGNOSIS — I2510 Atherosclerotic heart disease of native coronary artery without angina pectoris: Secondary | ICD-10-CM | POA: Diagnosis not present

## 2022-05-05 DIAGNOSIS — E785 Hyperlipidemia, unspecified: Secondary | ICD-10-CM | POA: Diagnosis not present

## 2022-05-06 DIAGNOSIS — R0789 Other chest pain: Secondary | ICD-10-CM | POA: Diagnosis not present

## 2022-05-27 DIAGNOSIS — M810 Age-related osteoporosis without current pathological fracture: Secondary | ICD-10-CM | POA: Diagnosis not present

## 2022-05-27 DIAGNOSIS — R030 Elevated blood-pressure reading, without diagnosis of hypertension: Secondary | ICD-10-CM | POA: Diagnosis not present

## 2022-05-27 DIAGNOSIS — Z23 Encounter for immunization: Secondary | ICD-10-CM | POA: Diagnosis not present

## 2022-05-27 DIAGNOSIS — I700 Atherosclerosis of aorta: Secondary | ICD-10-CM | POA: Diagnosis not present

## 2022-05-27 DIAGNOSIS — D320 Benign neoplasm of cerebral meninges: Secondary | ICD-10-CM | POA: Diagnosis not present

## 2022-05-27 DIAGNOSIS — M1990 Unspecified osteoarthritis, unspecified site: Secondary | ICD-10-CM | POA: Diagnosis not present

## 2022-05-27 DIAGNOSIS — Z1331 Encounter for screening for depression: Secondary | ICD-10-CM | POA: Diagnosis not present

## 2022-05-27 DIAGNOSIS — R519 Headache, unspecified: Secondary | ICD-10-CM | POA: Diagnosis not present

## 2022-05-27 DIAGNOSIS — E785 Hyperlipidemia, unspecified: Secondary | ICD-10-CM | POA: Diagnosis not present

## 2022-05-27 DIAGNOSIS — I2510 Atherosclerotic heart disease of native coronary artery without angina pectoris: Secondary | ICD-10-CM | POA: Diagnosis not present

## 2022-05-27 DIAGNOSIS — R011 Cardiac murmur, unspecified: Secondary | ICD-10-CM | POA: Diagnosis not present

## 2022-05-27 DIAGNOSIS — Z0000 Encounter for general adult medical examination without abnormal findings: Secondary | ICD-10-CM | POA: Diagnosis not present

## 2022-05-27 DIAGNOSIS — I10 Essential (primary) hypertension: Secondary | ICD-10-CM | POA: Diagnosis not present

## 2022-05-27 DIAGNOSIS — Z1339 Encounter for screening examination for other mental health and behavioral disorders: Secondary | ICD-10-CM | POA: Diagnosis not present

## 2022-05-27 DIAGNOSIS — M546 Pain in thoracic spine: Secondary | ICD-10-CM | POA: Diagnosis not present

## 2022-06-11 DIAGNOSIS — I2510 Atherosclerotic heart disease of native coronary artery without angina pectoris: Secondary | ICD-10-CM | POA: Diagnosis not present

## 2022-06-11 DIAGNOSIS — E785 Hyperlipidemia, unspecified: Secondary | ICD-10-CM | POA: Diagnosis not present

## 2022-06-11 DIAGNOSIS — R079 Chest pain, unspecified: Secondary | ICD-10-CM | POA: Diagnosis not present

## 2022-06-11 DIAGNOSIS — Z881 Allergy status to other antibiotic agents status: Secondary | ICD-10-CM | POA: Diagnosis not present

## 2022-06-11 DIAGNOSIS — I340 Nonrheumatic mitral (valve) insufficiency: Secondary | ICD-10-CM | POA: Diagnosis not present

## 2022-06-11 DIAGNOSIS — I498 Other specified cardiac arrhythmias: Secondary | ICD-10-CM | POA: Diagnosis not present

## 2022-06-11 DIAGNOSIS — Z888 Allergy status to other drugs, medicaments and biological substances status: Secondary | ICD-10-CM | POA: Diagnosis not present

## 2022-06-11 DIAGNOSIS — I10 Essential (primary) hypertension: Secondary | ICD-10-CM | POA: Diagnosis not present

## 2022-06-11 DIAGNOSIS — I959 Hypotension, unspecified: Secondary | ICD-10-CM | POA: Diagnosis not present

## 2022-06-11 DIAGNOSIS — Z79899 Other long term (current) drug therapy: Secondary | ICD-10-CM | POA: Diagnosis not present

## 2022-06-11 DIAGNOSIS — R0602 Shortness of breath: Secondary | ICD-10-CM | POA: Diagnosis not present

## 2022-06-11 DIAGNOSIS — I9589 Other hypotension: Secondary | ICD-10-CM | POA: Diagnosis not present

## 2022-06-11 DIAGNOSIS — I700 Atherosclerosis of aorta: Secondary | ICD-10-CM | POA: Diagnosis not present

## 2022-06-29 DIAGNOSIS — U071 COVID-19: Secondary | ICD-10-CM | POA: Diagnosis not present

## 2022-07-01 DIAGNOSIS — U071 COVID-19: Secondary | ICD-10-CM | POA: Diagnosis not present

## 2022-09-17 DIAGNOSIS — I2510 Atherosclerotic heart disease of native coronary artery without angina pectoris: Secondary | ICD-10-CM | POA: Diagnosis not present

## 2022-09-17 DIAGNOSIS — R058 Other specified cough: Secondary | ICD-10-CM | POA: Diagnosis not present

## 2022-09-17 DIAGNOSIS — Z20818 Contact with and (suspected) exposure to other bacterial communicable diseases: Secondary | ICD-10-CM | POA: Diagnosis not present

## 2022-09-17 DIAGNOSIS — Z1152 Encounter for screening for COVID-19: Secondary | ICD-10-CM | POA: Diagnosis not present

## 2022-09-17 DIAGNOSIS — R0600 Dyspnea, unspecified: Secondary | ICD-10-CM | POA: Diagnosis not present

## 2022-09-23 DIAGNOSIS — I2510 Atherosclerotic heart disease of native coronary artery without angina pectoris: Secondary | ICD-10-CM | POA: Diagnosis not present

## 2022-10-02 DIAGNOSIS — I209 Angina pectoris, unspecified: Secondary | ICD-10-CM | POA: Diagnosis not present

## 2022-10-06 DIAGNOSIS — Z01812 Encounter for preprocedural laboratory examination: Secondary | ICD-10-CM | POA: Diagnosis not present

## 2022-10-06 DIAGNOSIS — I2089 Other forms of angina pectoris: Secondary | ICD-10-CM | POA: Diagnosis not present

## 2022-10-07 DIAGNOSIS — Z01812 Encounter for preprocedural laboratory examination: Secondary | ICD-10-CM | POA: Diagnosis not present

## 2022-10-07 DIAGNOSIS — I2089 Other forms of angina pectoris: Secondary | ICD-10-CM | POA: Diagnosis not present

## 2022-10-09 DIAGNOSIS — E785 Hyperlipidemia, unspecified: Secondary | ICD-10-CM | POA: Diagnosis not present

## 2022-10-09 DIAGNOSIS — I10 Essential (primary) hypertension: Secondary | ICD-10-CM | POA: Diagnosis not present

## 2022-10-09 DIAGNOSIS — Z881 Allergy status to other antibiotic agents status: Secondary | ICD-10-CM | POA: Diagnosis not present

## 2022-10-09 DIAGNOSIS — I2584 Coronary atherosclerosis due to calcified coronary lesion: Secondary | ICD-10-CM | POA: Diagnosis not present

## 2022-10-09 DIAGNOSIS — I25118 Atherosclerotic heart disease of native coronary artery with other forms of angina pectoris: Secondary | ICD-10-CM | POA: Diagnosis not present

## 2022-10-09 DIAGNOSIS — Z01810 Encounter for preprocedural cardiovascular examination: Secondary | ICD-10-CM | POA: Diagnosis not present

## 2022-10-10 DIAGNOSIS — E756 Lipid storage disorder, unspecified: Secondary | ICD-10-CM | POA: Diagnosis not present

## 2022-10-10 DIAGNOSIS — I10 Essential (primary) hypertension: Secondary | ICD-10-CM | POA: Diagnosis not present

## 2022-10-10 DIAGNOSIS — I25118 Atherosclerotic heart disease of native coronary artery with other forms of angina pectoris: Secondary | ICD-10-CM | POA: Diagnosis not present

## 2022-10-10 DIAGNOSIS — Z95818 Presence of other cardiac implants and grafts: Secondary | ICD-10-CM | POA: Diagnosis not present

## 2022-10-10 DIAGNOSIS — E785 Hyperlipidemia, unspecified: Secondary | ICD-10-CM | POA: Diagnosis not present

## 2022-10-10 DIAGNOSIS — I25119 Atherosclerotic heart disease of native coronary artery with unspecified angina pectoris: Secondary | ICD-10-CM | POA: Diagnosis not present

## 2022-10-10 DIAGNOSIS — Z881 Allergy status to other antibiotic agents status: Secondary | ICD-10-CM | POA: Diagnosis not present

## 2022-10-20 DIAGNOSIS — I081 Rheumatic disorders of both mitral and tricuspid valves: Secondary | ICD-10-CM | POA: Diagnosis not present

## 2022-10-29 ENCOUNTER — Encounter (HOSPITAL_COMMUNITY): Payer: Medicare Other

## 2022-10-29 ENCOUNTER — Other Ambulatory Visit (HOSPITAL_COMMUNITY): Payer: Self-pay | Admitting: *Deleted

## 2022-10-30 ENCOUNTER — Ambulatory Visit (HOSPITAL_COMMUNITY)
Admission: RE | Admit: 2022-10-30 | Discharge: 2022-10-30 | Disposition: A | Discharge status: Home / Self Care | Payer: Medicare Other | Source: Ambulatory Visit | Attending: Internal Medicine | Admitting: Internal Medicine

## 2022-10-30 DIAGNOSIS — M810 Age-related osteoporosis without current pathological fracture: Secondary | ICD-10-CM | POA: Diagnosis not present

## 2022-10-30 MED ORDER — DENOSUMAB 60 MG/ML ~~LOC~~ SOSY
60.0000 mg | PREFILLED_SYRINGE | Freq: Once | SUBCUTANEOUS | Status: AC
  Administered 2022-10-30: 60 mg via SUBCUTANEOUS

## 2022-10-30 MED ORDER — DENOSUMAB 60 MG/ML ~~LOC~~ SOSY
PREFILLED_SYRINGE | SUBCUTANEOUS | Status: AC
  Filled 2022-10-30: qty 1

## 2022-11-26 ENCOUNTER — Ambulatory Visit: Payer: Medicare Other | Admitting: Podiatry

## 2022-11-26 ENCOUNTER — Encounter: Payer: Self-pay | Admitting: Podiatry

## 2022-11-26 ENCOUNTER — Ambulatory Visit (INDEPENDENT_AMBULATORY_CARE_PROVIDER_SITE_OTHER): Payer: Medicare Other | Admitting: Podiatry

## 2022-11-26 DIAGNOSIS — M810 Age-related osteoporosis without current pathological fracture: Secondary | ICD-10-CM | POA: Diagnosis not present

## 2022-11-26 DIAGNOSIS — L84 Corns and callosities: Secondary | ICD-10-CM | POA: Diagnosis not present

## 2022-11-26 NOTE — Progress Notes (Signed)
Subjective:   Patient ID: Pamela Blair, female   DOB: 82 y.o.   MRN: [id number]   HPI Patient presents with chronic callus underneath the right foot which has been painful and making walking difficult   ROS      Objective:  Physical Exam  Neurovascular status intact 3 separate keratotic lesions plantar right painful     Assessment:  Chronic lesion formation plantar right     Plan:  Debridement of lesions no angiogenic bleeding reappoint routine care

## 2022-12-08 DIAGNOSIS — J029 Acute pharyngitis, unspecified: Secondary | ICD-10-CM | POA: Diagnosis not present

## 2022-12-08 DIAGNOSIS — Z1152 Encounter for screening for COVID-19: Secondary | ICD-10-CM | POA: Diagnosis not present

## 2022-12-08 DIAGNOSIS — R058 Other specified cough: Secondary | ICD-10-CM | POA: Diagnosis not present

## 2022-12-08 DIAGNOSIS — R110 Nausea: Secondary | ICD-10-CM | POA: Diagnosis not present

## 2022-12-08 DIAGNOSIS — R519 Headache, unspecified: Secondary | ICD-10-CM | POA: Diagnosis not present

## 2022-12-09 ENCOUNTER — Other Ambulatory Visit: Payer: Self-pay | Admitting: Internal Medicine

## 2022-12-15 DIAGNOSIS — I2510 Atherosclerotic heart disease of native coronary artery without angina pectoris: Secondary | ICD-10-CM | POA: Diagnosis not present

## 2022-12-15 DIAGNOSIS — E785 Hyperlipidemia, unspecified: Secondary | ICD-10-CM | POA: Diagnosis not present

## 2022-12-22 DIAGNOSIS — R5383 Other fatigue: Secondary | ICD-10-CM | POA: Diagnosis not present

## 2022-12-22 DIAGNOSIS — J209 Acute bronchitis, unspecified: Secondary | ICD-10-CM | POA: Diagnosis not present

## 2022-12-22 DIAGNOSIS — M791 Myalgia, unspecified site: Secondary | ICD-10-CM | POA: Diagnosis not present

## 2023-01-04 ENCOUNTER — Other Ambulatory Visit: Payer: Self-pay | Admitting: Internal Medicine

## 2023-01-08 ENCOUNTER — Other Ambulatory Visit: Payer: Self-pay | Admitting: Internal Medicine

## 2023-02-15 DIAGNOSIS — M79644 Pain in right finger(s): Secondary | ICD-10-CM | POA: Diagnosis not present

## 2023-02-15 DIAGNOSIS — M65331 Trigger finger, right middle finger: Secondary | ICD-10-CM | POA: Diagnosis not present

## 2023-02-25 DIAGNOSIS — Z1152 Encounter for screening for COVID-19: Secondary | ICD-10-CM | POA: Diagnosis not present

## 2023-02-25 DIAGNOSIS — J029 Acute pharyngitis, unspecified: Secondary | ICD-10-CM | POA: Diagnosis not present

## 2023-02-25 DIAGNOSIS — R42 Dizziness and giddiness: Secondary | ICD-10-CM | POA: Diagnosis not present

## 2023-02-25 DIAGNOSIS — R051 Acute cough: Secondary | ICD-10-CM | POA: Diagnosis not present

## 2023-02-25 DIAGNOSIS — U071 COVID-19: Secondary | ICD-10-CM | POA: Diagnosis not present

## 2023-02-25 DIAGNOSIS — R0981 Nasal congestion: Secondary | ICD-10-CM | POA: Diagnosis not present

## 2023-02-25 DIAGNOSIS — I10 Essential (primary) hypertension: Secondary | ICD-10-CM | POA: Diagnosis not present

## 2023-03-01 DIAGNOSIS — Z4789 Encounter for other orthopedic aftercare: Secondary | ICD-10-CM | POA: Diagnosis not present

## 2023-03-01 DIAGNOSIS — M65331 Trigger finger, right middle finger: Secondary | ICD-10-CM | POA: Diagnosis not present

## 2023-03-12 DIAGNOSIS — Z961 Presence of intraocular lens: Secondary | ICD-10-CM | POA: Diagnosis not present

## 2023-03-12 DIAGNOSIS — H5213 Myopia, bilateral: Secondary | ICD-10-CM | POA: Diagnosis not present

## 2023-03-15 DIAGNOSIS — M25641 Stiffness of right hand, not elsewhere classified: Secondary | ICD-10-CM | POA: Diagnosis not present

## 2023-03-16 DIAGNOSIS — I10 Essential (primary) hypertension: Secondary | ICD-10-CM | POA: Diagnosis not present

## 2023-03-16 DIAGNOSIS — M810 Age-related osteoporosis without current pathological fracture: Secondary | ICD-10-CM | POA: Diagnosis not present

## 2023-03-16 DIAGNOSIS — I700 Atherosclerosis of aorta: Secondary | ICD-10-CM | POA: Diagnosis not present

## 2023-03-16 DIAGNOSIS — I2510 Atherosclerotic heart disease of native coronary artery without angina pectoris: Secondary | ICD-10-CM | POA: Diagnosis not present

## 2023-03-16 DIAGNOSIS — Z23 Encounter for immunization: Secondary | ICD-10-CM | POA: Diagnosis not present

## 2023-03-16 DIAGNOSIS — E785 Hyperlipidemia, unspecified: Secondary | ICD-10-CM | POA: Diagnosis not present

## 2023-04-15 ENCOUNTER — Encounter: Payer: Self-pay | Admitting: Podiatry

## 2023-04-15 ENCOUNTER — Ambulatory Visit: Payer: Medicare Other | Admitting: Podiatry

## 2023-04-15 DIAGNOSIS — L84 Corns and callosities: Secondary | ICD-10-CM | POA: Diagnosis not present

## 2023-04-16 NOTE — Progress Notes (Signed)
Subjective:   Patient ID: Pamela Blair, female   DOB: 82 y.o.   MRN: [id number]   HPI Patient presents with painful calluses plantar aspect right hard to walk on   ROS      Objective:  Physical Exam  Neurovascular status intact thick keratotic lesion x 2 right painful with bone pressure     Assessment:  Chronic lesion formations plantar aspect right foot     Plan:  Sterile sharp debridement of lesions.  No iatrogenic bleeding reappoint to recheck

## 2023-04-30 ENCOUNTER — Other Ambulatory Visit (HOSPITAL_COMMUNITY): Payer: Self-pay | Admitting: *Deleted

## 2023-05-03 ENCOUNTER — Ambulatory Visit (HOSPITAL_COMMUNITY)
Admission: RE | Admit: 2023-05-03 | Discharge: 2023-05-03 | Disposition: A | Discharge status: Home / Self Care | Payer: Medicare Other | Source: Ambulatory Visit | Attending: Internal Medicine | Admitting: Internal Medicine

## 2023-05-03 DIAGNOSIS — M810 Age-related osteoporosis without current pathological fracture: Secondary | ICD-10-CM | POA: Insufficient documentation

## 2023-05-03 MED ORDER — DENOSUMAB 60 MG/ML ~~LOC~~ SOSY
PREFILLED_SYRINGE | SUBCUTANEOUS | Status: AC
  Filled 2023-05-03: qty 1

## 2023-05-03 MED ORDER — DENOSUMAB 60 MG/ML ~~LOC~~ SOSY
60.0000 mg | PREFILLED_SYRINGE | Freq: Once | SUBCUTANEOUS | Status: AC
Start: 2023-05-03 — End: 2023-05-03
  Administered 2023-05-03: 60 mg via SUBCUTANEOUS

## 2023-05-25 DIAGNOSIS — E785 Hyperlipidemia, unspecified: Secondary | ICD-10-CM | POA: Diagnosis not present

## 2023-05-25 DIAGNOSIS — I2510 Atherosclerotic heart disease of native coronary artery without angina pectoris: Secondary | ICD-10-CM | POA: Diagnosis not present

## 2023-06-11 DIAGNOSIS — H903 Sensorineural hearing loss, bilateral: Secondary | ICD-10-CM | POA: Diagnosis not present

## 2023-08-06 DIAGNOSIS — E785 Hyperlipidemia, unspecified: Secondary | ICD-10-CM | POA: Diagnosis not present

## 2023-08-06 DIAGNOSIS — I2510 Atherosclerotic heart disease of native coronary artery without angina pectoris: Secondary | ICD-10-CM | POA: Diagnosis not present

## 2023-08-06 DIAGNOSIS — D509 Iron deficiency anemia, unspecified: Secondary | ICD-10-CM | POA: Diagnosis not present

## 2023-08-06 DIAGNOSIS — M810 Age-related osteoporosis without current pathological fracture: Secondary | ICD-10-CM | POA: Diagnosis not present

## 2023-08-06 DIAGNOSIS — I10 Essential (primary) hypertension: Secondary | ICD-10-CM | POA: Diagnosis not present

## 2023-08-06 DIAGNOSIS — D649 Anemia, unspecified: Secondary | ICD-10-CM | POA: Diagnosis not present

## 2023-08-13 DIAGNOSIS — R82998 Other abnormal findings in urine: Secondary | ICD-10-CM | POA: Diagnosis not present

## 2023-08-13 DIAGNOSIS — I700 Atherosclerosis of aorta: Secondary | ICD-10-CM | POA: Diagnosis not present

## 2023-08-13 DIAGNOSIS — D509 Iron deficiency anemia, unspecified: Secondary | ICD-10-CM | POA: Diagnosis not present

## 2023-08-13 DIAGNOSIS — M810 Age-related osteoporosis without current pathological fracture: Secondary | ICD-10-CM | POA: Diagnosis not present

## 2023-08-13 DIAGNOSIS — Z0000 Encounter for general adult medical examination without abnormal findings: Secondary | ICD-10-CM | POA: Diagnosis not present

## 2023-08-13 DIAGNOSIS — I2510 Atherosclerotic heart disease of native coronary artery without angina pectoris: Secondary | ICD-10-CM | POA: Diagnosis not present

## 2023-08-13 DIAGNOSIS — D329 Benign neoplasm of meninges, unspecified: Secondary | ICD-10-CM | POA: Diagnosis not present

## 2023-08-13 DIAGNOSIS — D320 Benign neoplasm of cerebral meninges: Secondary | ICD-10-CM | POA: Diagnosis not present

## 2023-08-13 DIAGNOSIS — F329 Major depressive disorder, single episode, unspecified: Secondary | ICD-10-CM | POA: Diagnosis not present

## 2023-08-13 DIAGNOSIS — I10 Essential (primary) hypertension: Secondary | ICD-10-CM | POA: Diagnosis not present

## 2023-08-13 DIAGNOSIS — E785 Hyperlipidemia, unspecified: Secondary | ICD-10-CM | POA: Diagnosis not present

## 2023-08-13 DIAGNOSIS — G720 Drug-induced myopathy: Secondary | ICD-10-CM | POA: Diagnosis not present

## 2023-08-13 DIAGNOSIS — M1990 Unspecified osteoarthritis, unspecified site: Secondary | ICD-10-CM | POA: Diagnosis not present

## 2023-10-12 DIAGNOSIS — M79604 Pain in right leg: Secondary | ICD-10-CM | POA: Diagnosis not present

## 2023-10-12 DIAGNOSIS — I499 Cardiac arrhythmia, unspecified: Secondary | ICD-10-CM | POA: Diagnosis not present

## 2023-10-12 DIAGNOSIS — E785 Hyperlipidemia, unspecified: Secondary | ICD-10-CM | POA: Diagnosis not present

## 2023-10-12 DIAGNOSIS — M79605 Pain in left leg: Secondary | ICD-10-CM | POA: Diagnosis not present

## 2023-10-12 DIAGNOSIS — I2510 Atherosclerotic heart disease of native coronary artery without angina pectoris: Secondary | ICD-10-CM | POA: Diagnosis not present

## 2023-10-14 DIAGNOSIS — E785 Hyperlipidemia, unspecified: Secondary | ICD-10-CM | POA: Diagnosis not present

## 2023-10-14 DIAGNOSIS — R059 Cough, unspecified: Secondary | ICD-10-CM | POA: Diagnosis not present

## 2023-10-14 DIAGNOSIS — J029 Acute pharyngitis, unspecified: Secondary | ICD-10-CM | POA: Diagnosis not present

## 2023-10-14 DIAGNOSIS — Z1152 Encounter for screening for COVID-19: Secondary | ICD-10-CM | POA: Diagnosis not present

## 2023-10-14 DIAGNOSIS — R0981 Nasal congestion: Secondary | ICD-10-CM | POA: Diagnosis not present

## 2023-10-14 DIAGNOSIS — U071 COVID-19: Secondary | ICD-10-CM | POA: Diagnosis not present

## 2023-11-12 ENCOUNTER — Ambulatory Visit (HOSPITAL_COMMUNITY)
Admission: RE | Admit: 2023-11-12 | Discharge: 2023-11-12 | Disposition: A | Discharge status: Home / Self Care | Source: Ambulatory Visit | Attending: Internal Medicine | Admitting: Internal Medicine

## 2023-11-12 ENCOUNTER — Encounter (HOSPITAL_COMMUNITY)

## 2023-11-12 ENCOUNTER — Other Ambulatory Visit (HOSPITAL_COMMUNITY): Payer: Self-pay | Admitting: *Deleted

## 2023-11-12 DIAGNOSIS — M810 Age-related osteoporosis without current pathological fracture: Secondary | ICD-10-CM | POA: Insufficient documentation

## 2023-11-12 MED ORDER — DENOSUMAB 60 MG/ML ~~LOC~~ SOSY
60.0000 mg | PREFILLED_SYRINGE | Freq: Once | SUBCUTANEOUS | Status: AC
  Administered 2023-11-12: 60 mg via SUBCUTANEOUS

## 2023-11-12 MED ORDER — DENOSUMAB 60 MG/ML ~~LOC~~ SOSY
PREFILLED_SYRINGE | SUBCUTANEOUS | Status: AC
  Filled 2023-11-12: qty 1

## 2023-11-16 ENCOUNTER — Encounter (HOSPITAL_COMMUNITY)

## 2024-01-05 DIAGNOSIS — I10 Essential (primary) hypertension: Secondary | ICD-10-CM | POA: Diagnosis not present

## 2024-01-05 DIAGNOSIS — J029 Acute pharyngitis, unspecified: Secondary | ICD-10-CM | POA: Diagnosis not present

## 2024-01-05 DIAGNOSIS — Z1152 Encounter for screening for COVID-19: Secondary | ICD-10-CM | POA: Diagnosis not present

## 2024-01-05 DIAGNOSIS — J020 Streptococcal pharyngitis: Secondary | ICD-10-CM | POA: Diagnosis not present

## 2024-03-09 ENCOUNTER — Ambulatory Visit: Admitting: Podiatry

## 2024-03-09 ENCOUNTER — Encounter: Payer: Self-pay | Admitting: Podiatry

## 2024-03-09 DIAGNOSIS — L84 Corns and callosities: Secondary | ICD-10-CM

## 2024-03-09 DIAGNOSIS — M546 Pain in thoracic spine: Secondary | ICD-10-CM | POA: Diagnosis not present

## 2024-03-09 DIAGNOSIS — M810 Age-related osteoporosis without current pathological fracture: Secondary | ICD-10-CM | POA: Diagnosis not present

## 2024-03-09 DIAGNOSIS — I10 Essential (primary) hypertension: Secondary | ICD-10-CM | POA: Diagnosis not present

## 2024-03-09 DIAGNOSIS — M545 Low back pain, unspecified: Secondary | ICD-10-CM | POA: Diagnosis not present

## 2024-03-09 DIAGNOSIS — N390 Urinary tract infection, site not specified: Secondary | ICD-10-CM | POA: Diagnosis not present

## 2024-03-10 NOTE — Progress Notes (Signed)
 Subjective:   Patient ID: Pamela Blair, female   DOB: 83 y.o.   MRN: [id number]   HPI Patient presents with 3 painful lesions plantar aspect right foot around the metatarsals   ROS      Objective:  Physical Exam  Neurovascular status intact with keratotic lesions x 3 right     Assessment:  Chronic lesions secondary to pressure     Plan:  Debridement of lesions right no iatrogenic bleeding reappoint routine care

## 2024-03-13 DIAGNOSIS — M5459 Other low back pain: Secondary | ICD-10-CM | POA: Diagnosis not present

## 2024-03-13 DIAGNOSIS — M533 Sacrococcygeal disorders, not elsewhere classified: Secondary | ICD-10-CM | POA: Diagnosis not present

## 2024-03-16 DIAGNOSIS — M533 Sacrococcygeal disorders, not elsewhere classified: Secondary | ICD-10-CM | POA: Diagnosis not present

## 2024-03-28 DIAGNOSIS — H04123 Dry eye syndrome of bilateral lacrimal glands: Secondary | ICD-10-CM | POA: Diagnosis not present

## 2024-03-28 DIAGNOSIS — H524 Presbyopia: Secondary | ICD-10-CM | POA: Diagnosis not present

## 2024-03-28 DIAGNOSIS — Z961 Presence of intraocular lens: Secondary | ICD-10-CM | POA: Diagnosis not present

## 2024-04-03 DIAGNOSIS — M5459 Other low back pain: Secondary | ICD-10-CM | POA: Diagnosis not present

## 2024-04-03 DIAGNOSIS — M533 Sacrococcygeal disorders, not elsewhere classified: Secondary | ICD-10-CM | POA: Diagnosis not present

## 2024-04-03 DIAGNOSIS — M5416 Radiculopathy, lumbar region: Secondary | ICD-10-CM | POA: Diagnosis not present

## 2024-04-04 DIAGNOSIS — M545 Low back pain, unspecified: Secondary | ICD-10-CM | POA: Diagnosis not present

## 2024-04-04 DIAGNOSIS — G5600 Carpal tunnel syndrome, unspecified upper limb: Secondary | ICD-10-CM | POA: Diagnosis not present

## 2024-04-04 DIAGNOSIS — E785 Hyperlipidemia, unspecified: Secondary | ICD-10-CM | POA: Diagnosis not present

## 2024-04-04 DIAGNOSIS — I10 Essential (primary) hypertension: Secondary | ICD-10-CM | POA: Diagnosis not present

## 2024-04-04 DIAGNOSIS — D509 Iron deficiency anemia, unspecified: Secondary | ICD-10-CM | POA: Diagnosis not present

## 2024-04-04 DIAGNOSIS — D329 Benign neoplasm of meninges, unspecified: Secondary | ICD-10-CM | POA: Diagnosis not present

## 2024-04-04 DIAGNOSIS — I700 Atherosclerosis of aorta: Secondary | ICD-10-CM | POA: Diagnosis not present

## 2024-04-04 DIAGNOSIS — I2510 Atherosclerotic heart disease of native coronary artery without angina pectoris: Secondary | ICD-10-CM | POA: Diagnosis not present

## 2024-04-04 DIAGNOSIS — Z23 Encounter for immunization: Secondary | ICD-10-CM | POA: Diagnosis not present

## 2024-04-04 DIAGNOSIS — M810 Age-related osteoporosis without current pathological fracture: Secondary | ICD-10-CM | POA: Diagnosis not present

## 2024-04-04 DIAGNOSIS — D649 Anemia, unspecified: Secondary | ICD-10-CM | POA: Diagnosis not present

## 2024-04-04 DIAGNOSIS — F329 Major depressive disorder, single episode, unspecified: Secondary | ICD-10-CM | POA: Diagnosis not present

## 2024-04-04 DIAGNOSIS — G720 Drug-induced myopathy: Secondary | ICD-10-CM | POA: Diagnosis not present

## 2024-04-10 ENCOUNTER — Other Ambulatory Visit (HOSPITAL_COMMUNITY): Payer: Self-pay | Admitting: Internal Medicine

## 2024-04-11 ENCOUNTER — Telehealth (HOSPITAL_COMMUNITY): Payer: Self-pay | Admitting: Pharmacy Technician

## 2024-04-11 DIAGNOSIS — E785 Hyperlipidemia, unspecified: Secondary | ICD-10-CM | POA: Diagnosis not present

## 2024-04-11 DIAGNOSIS — I2510 Atherosclerotic heart disease of native coronary artery without angina pectoris: Secondary | ICD-10-CM | POA: Diagnosis not present

## 2024-04-11 DIAGNOSIS — R011 Cardiac murmur, unspecified: Secondary | ICD-10-CM | POA: Diagnosis not present

## 2024-04-11 NOTE — Telephone Encounter (Signed)
 Auth Submission: NO AUTH NEEDED Site of care: [facility] Payer: Medicare A/B, [employer] Supp   Medication & CPT/J Code(s) submitted: Prolia  (Denosumab ) [id number] Diagnosis Code: M81.0 Route of submission (phone, fax, portal):  Phone # Fax # Auth type: Buy/Bill HB Units/visits requested: 60mg  x 2 doses, q 6 months Reference number:  Approval from: 04/11/2024 to 07/05/24    Dagoberto Armour, CPhT [facility] Phone: [phone] 04/11/2024

## 2024-04-19 DIAGNOSIS — M5416 Radiculopathy, lumbar region: Secondary | ICD-10-CM | POA: Diagnosis not present

## 2024-05-15 ENCOUNTER — Ambulatory Visit (HOSPITAL_COMMUNITY)
Admission: RE | Admit: 2024-05-15 | Discharge: 2024-05-15 | Disposition: A | Discharge status: Home / Self Care | Source: Ambulatory Visit | Attending: Internal Medicine | Admitting: Internal Medicine

## 2024-05-15 VITALS — BP 106/57 | HR 84 | Temp 97.2°F | Resp 16

## 2024-05-15 DIAGNOSIS — M810 Age-related osteoporosis without current pathological fracture: Secondary | ICD-10-CM | POA: Insufficient documentation

## 2024-05-15 MED ORDER — DENOSUMAB 60 MG/ML ~~LOC~~ SOSY
PREFILLED_SYRINGE | SUBCUTANEOUS | Status: AC
  Filled 2024-05-15: qty 1

## 2024-05-15 MED ORDER — DENOSUMAB 60 MG/ML ~~LOC~~ SOSY
60.0000 mg | PREFILLED_SYRINGE | Freq: Once | SUBCUTANEOUS | Status: AC
  Administered 2024-05-15: 60 mg via SUBCUTANEOUS

## 2024-06-05 DIAGNOSIS — M533 Sacrococcygeal disorders, not elsewhere classified: Secondary | ICD-10-CM | POA: Diagnosis not present

## 2024-06-05 DIAGNOSIS — M5416 Radiculopathy, lumbar region: Secondary | ICD-10-CM | POA: Diagnosis not present

## 2024-11-07 ENCOUNTER — Encounter (HOSPITAL_COMMUNITY)

## 2024-11-13 ENCOUNTER — Encounter (HOSPITAL_COMMUNITY)
